# Patient Record
Sex: Male | Born: 1950
Health system: Southern US, Community
[De-identification: ages and names within clinical notes are randomized; demographics above are authoritative.]

## PROBLEM LIST (undated history)

## (undated) DIAGNOSIS — J449 Chronic obstructive pulmonary disease, unspecified: Secondary | ICD-10-CM

## (undated) DIAGNOSIS — E785 Hyperlipidemia, unspecified: Secondary | ICD-10-CM

## (undated) DIAGNOSIS — F32A Depression, unspecified: Secondary | ICD-10-CM

## (undated) DIAGNOSIS — D689 Coagulation defect, unspecified: Secondary | ICD-10-CM

## (undated) DIAGNOSIS — K219 Gastro-esophageal reflux disease without esophagitis: Secondary | ICD-10-CM

## (undated) DIAGNOSIS — F419 Anxiety disorder, unspecified: Secondary | ICD-10-CM

## (undated) DIAGNOSIS — I10 Essential (primary) hypertension: Secondary | ICD-10-CM

## (undated) DIAGNOSIS — K859 Acute pancreatitis without necrosis or infection, unspecified: Secondary | ICD-10-CM

## (undated) DIAGNOSIS — F329 Major depressive disorder, single episode, unspecified: Secondary | ICD-10-CM

## (undated) DIAGNOSIS — I639 Cerebral infarction, unspecified: Secondary | ICD-10-CM

## (undated) DIAGNOSIS — Z5189 Encounter for other specified aftercare: Secondary | ICD-10-CM

## (undated) HISTORY — DX: Encounter for other specified aftercare: Z51.89

## (undated) HISTORY — PX: OTHER SURGICAL HISTORY: SHX169

## (undated) HISTORY — PX: LAPAROSCOPIC LYSIS OF ADHESIONS: SHX5905

## (undated) HISTORY — DX: Hyperlipidemia, unspecified: E78.5

## (undated) HISTORY — DX: Cerebral infarction, unspecified: I63.9

## (undated) HISTORY — DX: Chronic obstructive pulmonary disease, unspecified: J44.9

## (undated) HISTORY — PX: GASTROJEJUNOSTOMY: SHX1697

## (undated) HISTORY — PX: CHOLECYSTECTOMY: SHX55

## (undated) HISTORY — DX: Major depressive disorder, single episode, unspecified: F32.9

## (undated) HISTORY — PX: GASTRECTOMY: SHX58

## (undated) HISTORY — DX: Acute pancreatitis without necrosis or infection, unspecified: K85.90

## (undated) HISTORY — DX: Depression, unspecified: F32.A

## (undated) HISTORY — DX: Gastro-esophageal reflux disease without esophagitis: K21.9

## (undated) HISTORY — DX: Essential (primary) hypertension: I10

## (undated) HISTORY — DX: Anxiety disorder, unspecified: F41.9

## (undated) HISTORY — PX: ROUX-EN-Y GASTRIC BYPASS: SHX1104

## (undated) HISTORY — DX: Coagulation defect, unspecified: D68.9

## (undated) HISTORY — PX: BILE DUCT EXPLORATION: SHX1225

---

## 2007-05-26 LAB — HM COLONOSCOPY: HM Colonoscopy: NORMAL

## 2009-05-25 DIAGNOSIS — D689 Coagulation defect, unspecified: Secondary | ICD-10-CM

## 2009-05-25 HISTORY — DX: Coagulation defect, unspecified: D68.9

## 2010-01-30 ENCOUNTER — Encounter: Payer: Self-pay | Admitting: Family Medicine

## 2010-02-03 ENCOUNTER — Ambulatory Visit: Payer: Self-pay | Admitting: Family Medicine

## 2010-02-03 DIAGNOSIS — I2699 Other pulmonary embolism without acute cor pulmonale: Secondary | ICD-10-CM | POA: Insufficient documentation

## 2010-02-03 DIAGNOSIS — L98499 Non-pressure chronic ulcer of skin of other sites with unspecified severity: Secondary | ICD-10-CM | POA: Insufficient documentation

## 2010-02-03 DIAGNOSIS — K631 Perforation of intestine (nontraumatic): Secondary | ICD-10-CM | POA: Insufficient documentation

## 2010-02-03 DIAGNOSIS — D509 Iron deficiency anemia, unspecified: Secondary | ICD-10-CM | POA: Insufficient documentation

## 2010-02-03 LAB — CONVERTED CEMR LAB: INR: 2.6

## 2010-02-06 ENCOUNTER — Encounter: Payer: Self-pay | Admitting: Family Medicine

## 2010-02-11 ENCOUNTER — Telehealth: Payer: Self-pay | Admitting: Family Medicine

## 2010-02-11 ENCOUNTER — Encounter: Payer: Self-pay | Admitting: Family Medicine

## 2010-02-11 LAB — CONVERTED CEMR LAB: INR: 2

## 2010-02-12 ENCOUNTER — Telehealth: Payer: Self-pay | Admitting: Family Medicine

## 2010-02-14 ENCOUNTER — Telehealth: Payer: Self-pay | Admitting: Family Medicine

## 2010-02-14 ENCOUNTER — Encounter: Payer: Self-pay | Admitting: Family Medicine

## 2010-02-14 LAB — CONVERTED CEMR LAB: INR: 1.3

## 2010-02-21 ENCOUNTER — Telehealth: Payer: Self-pay | Admitting: Family Medicine

## 2010-02-21 ENCOUNTER — Encounter: Payer: Self-pay | Admitting: Family Medicine

## 2010-02-21 LAB — CONVERTED CEMR LAB: INR: 1.1

## 2010-02-24 ENCOUNTER — Telehealth (INDEPENDENT_AMBULATORY_CARE_PROVIDER_SITE_OTHER): Payer: Self-pay | Admitting: *Deleted

## 2010-03-05 ENCOUNTER — Ambulatory Visit: Payer: Self-pay | Admitting: Family Medicine

## 2010-03-05 ENCOUNTER — Encounter: Payer: Self-pay | Admitting: Family Medicine

## 2010-03-05 LAB — CONVERTED CEMR LAB: Prothrombin Time: 15.2 s (ref 11.6–15.2)

## 2010-03-06 LAB — CONVERTED CEMR LAB
Basophils Relative: 0 % (ref 0–1)
Eosinophils Absolute: 0.1 10*3/uL (ref 0.0–0.7)
HCT: 36.5 % — ABNORMAL LOW (ref 39.0–52.0)
Hemoglobin: 12.5 g/dL — ABNORMAL LOW (ref 13.0–17.0)
Lymphs Abs: 2 10*3/uL (ref 0.7–4.0)
MCHC: 34.2 g/dL (ref 30.0–36.0)
MCV: 91.3 fL (ref 78.0–100.0)
Monocytes Absolute: 0.4 10*3/uL (ref 0.1–1.0)
Monocytes Relative: 9 % (ref 3–12)
RBC: 4 M/uL — ABNORMAL LOW (ref 4.22–5.81)
WBC: 5 10*3/uL (ref 4.0–10.5)

## 2010-03-07 ENCOUNTER — Telehealth: Payer: Self-pay | Admitting: Family Medicine

## 2010-03-07 ENCOUNTER — Encounter: Payer: Self-pay | Admitting: Family Medicine

## 2010-03-07 LAB — CONVERTED CEMR LAB
INR: 1.5
Prothrombin Time: 15.6 s

## 2010-03-10 ENCOUNTER — Telehealth: Payer: Self-pay | Admitting: Family Medicine

## 2010-03-17 ENCOUNTER — Encounter: Payer: Self-pay | Admitting: Family Medicine

## 2010-03-25 ENCOUNTER — Ambulatory Visit: Payer: Self-pay | Admitting: Family Medicine

## 2010-04-01 ENCOUNTER — Ambulatory Visit: Payer: Self-pay | Admitting: Family Medicine

## 2010-04-01 LAB — CONVERTED CEMR LAB: INR: 1.3

## 2010-04-08 ENCOUNTER — Ambulatory Visit: Payer: Self-pay | Admitting: Family Medicine

## 2010-04-08 LAB — CONVERTED CEMR LAB: INR: 1.7

## 2010-04-15 ENCOUNTER — Ambulatory Visit: Payer: Self-pay | Admitting: Family Medicine

## 2010-04-15 LAB — CONVERTED CEMR LAB: INR: 2.1

## 2010-04-21 ENCOUNTER — Telehealth: Payer: Self-pay | Admitting: Family Medicine

## 2010-04-28 ENCOUNTER — Ambulatory Visit: Payer: Self-pay | Admitting: Family Medicine

## 2010-05-05 ENCOUNTER — Telehealth: Payer: Self-pay | Admitting: Family Medicine

## 2010-05-20 ENCOUNTER — Ambulatory Visit: Payer: Self-pay | Admitting: Family Medicine

## 2010-05-20 LAB — CONVERTED CEMR LAB: INR: 1.7

## 2010-05-27 ENCOUNTER — Ambulatory Visit
Admission: RE | Admit: 2010-05-27 | Discharge: 2010-05-27 | Payer: Self-pay | Source: Home / Self Care | Attending: Family Medicine | Admitting: Family Medicine

## 2010-05-27 LAB — CONVERTED CEMR LAB: INR: 1.8

## 2010-06-06 ENCOUNTER — Ambulatory Visit
Admission: RE | Admit: 2010-06-06 | Discharge: 2010-06-06 | Payer: Self-pay | Source: Home / Self Care | Attending: Family Medicine | Admitting: Family Medicine

## 2010-06-11 ENCOUNTER — Encounter: Payer: Self-pay | Admitting: Family Medicine

## 2010-06-12 LAB — CONVERTED CEMR LAB
AST: 22 units/L (ref 0–37)
Albumin: 4.5 g/dL (ref 3.5–5.2)
BUN: 17 mg/dL (ref 6–23)
Calcium: 9.7 mg/dL (ref 8.4–10.5)
Chloride: 103 meq/L (ref 96–112)
Creatinine, Ser: 1.31 mg/dL (ref 0.40–1.50)
Glucose, Bld: 86 mg/dL (ref 70–99)
HDL goal, serum: 40 mg/dL
HDL: 36 mg/dL — ABNORMAL LOW (ref >39)
LDL Goal: 130 mg/dL
Potassium: 5.1 meq/L (ref 3.5–5.3)
Total CHOL/HDL Ratio: 6.1
Triglycerides: 304 mg/dL — ABNORMAL HIGH (ref <150)

## 2010-06-20 ENCOUNTER — Ambulatory Visit
Admission: RE | Admit: 2010-06-20 | Discharge: 2010-06-20 | Payer: Self-pay | Source: Home / Self Care | Attending: Family Medicine | Admitting: Family Medicine

## 2010-06-20 LAB — CONVERTED CEMR LAB: INR: 1.9

## 2010-06-24 NOTE — Assessment & Plan Note (Signed)
Summary: NOV: PE, anemia, perf ulcer   Vital Signs:  Patient profile:   60 year old male Height:      73.5 inches Weight:      172 pounds BMI:     22.47 Pulse rate:   112 / minute BP sitting:   107 / 67  (right arm) Cuff size:   regular  Vitals Entered By: Avon Gully CMA, Duncan Dull) (February 03, 2010 11:26 AM) CC: NP pt--est care and f/u coumadin   CC:  NP pt--est care and f/u coumadin.  History of Present Illness: March 7th wetn to ED for flu like sxs. Has a gastric ulcer that went to the pancreas and into the gut. He was leaking bile into the gut and went into the septic shock. Ws transfered to [facility]. Had surgery adn sent home June 22nd adn the drain came out.  Then had pancreatitis. Went badk to the hospital and was d/c home.For a month had fluid leaking inside.  Then went back end of July and had 3 drains placed. Then had a DVT in the right lung  adn then a PE. Then 2 weeks ago spiked a fever.  They felt the PICC line was infected.  Has a pancreatic and duodenum leak. 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 See Dr. Gorden Harms (hepatobiliary syndrome) in [place].  Goes Wednesday for f/u in [place]. Per daughter his output has increassed in the last couple o days.   Anticoagulation Management History:      The patient is on coumadin and comes in today for a routine follow up visit.  Today's INR is 2.6.    Habits & Providers  Alcohol-Tobacco-Diet     Alcohol drinks/day: 0     Tobacco Status: quit     Year Quit: 9yrs ago  Exercise-Depression-Behavior     Does Patient Exercise: no     Drug Use: no  Current Medications (verified): 1)  Citalopram Hydrobromide 20 Mg Tabs (Citalopram Hydrobromide) .... Take One Tablet By Mouth Once A Day 2)  Ferrous Sulfate 325 (65 Fe) Mg Tabs (Ferrous Sulfate) .... One Tablet By Mouth 3 X A Day 3)  Warfarin Sodium 2 Mg Tabs (Warfarin Sodium) .... Take One Tablet By Mouth Daily 4)  Oxycodone Hcl 5 Mg Tabs (Oxycodone Hcl) .... Take Two Tablets  Every 4 Hours As Needed For Pain 5)  Transderm-Scop 1.5 Mg Pt72 (Scopolamine Base) .... Apply Patch Every 2 Hours 6)  Omeprazole 20 Mg Cpdr (Omeprazole) .... Take One Tablet By Mouth Once A Day 7)  Ondansetron Hcl 4 Mg Tabs (Ondansetron Hcl) .... One Tablet By Mouth Every 4 Hours 8)  Promethazine Hcl 12.5 Mg Tabs (Promethazine Hcl) .... One Tablet By Mouth Every 6 Hours 9)  Multivitamins  Caps (Multiple Vitamin) .... One Tablet By Mouth Once A Day  Allergies (verified): No Known Drug Allergies  Comments:  Nurse/Medical Assistant: The patient's medications and allergies were reviewed with the patient and were updated in the Medication and Allergy Lists. Avon Gully CMA, Duncan Dull) (February 03, 2010 11:33 AM)  Past History:  Past Medical History: Hx of GI ulcers sine a teenager.   Past Surgical History: Cholecystectomy and 3 reconsgtruction surgeries. 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    Family History: Father wtih Prosta and lip cancer, MI Brother with lung Ca Sister with colon Ca Mother wtih HTN  Social History: Divorced. Former Smoker Alcohol use-no Drug use-no Regular exercise-no Smoking Status:  quit Does Patient Exercise:  no Drug Use:  no  Review  of Systems       No fever/sweats/weakness, unexplained weight loss/gain.  No vison changes.  No difficulty hearing/ringing in ears, hay fever/allergies.  No chest pain/discomfort, palpitations.  No Br lump/nipple discharge.  No cough/wheeze.  No blood in BM, nausea/vomiting/diarrhea.  No nighttime urination, leaking urine, unusual vaginal bleeding, discharge (penis or vagina).  No muscle/joint pain. No rash, change in mole.  No HA, memory loss.  No anxiety, sleep d/o, depression.  No easy bruising/bleeding, unexplained lump   Physical Exam  General:  Well-developed,well-nourished,in no acute distress; alert,appropriate and cooperative throughout examination Head:  Normocephalic and atraumatic without obvious abnormalities. No  apparent alopecia or balding. Neck:  No deformities, masses, or tenderness noted. Lungs:  Normal respiratory effort, chest expands symmetrically. Lungs are clear to auscultation, no crackles or wheezes. Heart:  Normal rate and regular rhythm. S1 and S2 normal without gallop, murmur, click, rub or other extra sounds. Abdomen:  incision well healed. 2 drain in place a a bag in place over second wound.     Impression & Recommendations:  Problem # 1:  PE (ICD-415.19)  Offered flu shot but says he was told not to get it yet until his health is better.   Adjusted his coumadin today.  His updated medication list for this problem includes:    Warfarin Sodium 2 Mg Tabs (Warfarin sodium) .Marland Kitchen... Take one tablet by mouth daily  Orders: Fingerstick (16109) Protime INR (60454)  Problem # 2:  ANEMIA, IRON DEFICIENCY (ICD-280.9) Will check his levels to see if can decreaes his iron dose form three times a day which is very constipating.  His updated medication list for this problem includes:    Ferrous Sulfate 325 (65 Fe) Mg Tabs (Ferrous sulfate) ..... One tablet by mouth 3 x a day  Orders: T-CBC w/Diff [phone]) T-Iron [phone]) T-Iron Binding Capacity (TIBC) ([address]) T-Vitamin B12 [phone])  Problem # 3:  PERFORATION OF INTESTINE (NUU-725.36) Keep appt with surgeon on Wednesday. If output increases dramtically or fever in the next 48 hours then call their office sooner.    Complete Medication List: 1)  Citalopram Hydrobromide 20 Mg Tabs (Citalopram hydrobromide) .... Take one tablet by mouth once a day 2)  Ferrous Sulfate 325 (65 Fe) Mg Tabs (Ferrous sulfate) .... One tablet by mouth 3 x a day 3)  Warfarin Sodium 2 Mg Tabs (Warfarin sodium) .... Take one tablet by mouth daily 4)  Oxycodone Hcl 5 Mg Tabs (Oxycodone hcl) .... Take two tablets every 4 hours as needed for pain 5)  Omeprazole 20 Mg Cpdr (Omeprazole) .... Take one tablet by mouth once a day 6)  Ondansetron Hcl 4  Mg Tabs (Ondansetron hcl) .... One tablet by mouth every 4 hours 7)  Promethazine Hcl 12.5 Mg Tabs (Promethazine hcl) .... One tablet by mouth every 6 hours 8)  Multivitamins Caps (Multiple vitamin) .... One tablet by mouth once a day 9)  Fentanyl 25 Mcg/hr Pt72 (Fentanyl) .... Apply to skin eveyr 3 days the replace.  Anticoagulation Management Assessment/Plan:      He is to have a PT/INR in 1 week.  Anticoagulation instructions were given to patient.         Current Anticoagulation Instructions: Warfarin sodium 2 mg tabs: take one tablet by mouth daily.  The patient is to continue with the same dose of coumadin.  This dosage includes: Repeat PT/INR in 1 week.    Patient Instructions: 1)  Please schedule a follow-up appointment in 1 year for office visit.  Prescriptions: FENTANYL 25 MCG/HR PT72 (FENTANYL) apply to skin eveyr 3 days The replace.  #10 day x 0   Entered and Authorized by:   Nani Gasser MD   Signed by:   Nani Gasser MD on 02/03/2010   Method used:   Print then Give to Patient   RxID:   [id number]    Laboratory Results   Blood Tests   Date/Time Received: 02/03/10 Date/Time Reported: 02/03/10   INR: 2.6   (Normal Range: 0.88-1.12   Therap INR: 2.0-3.5)      Flex Sig Next Due:  Not Indicated Colonoscopy Result Date:  05/26/2007 Colonoscopy Result:  normal Hemoccult Next Due:  Not Indicated

## 2010-06-24 NOTE — Miscellaneous (Signed)
Summary: Coumadin Order/[facility]  Coumadin Order/[facility]   Imported By: Lanelle Bal 02/19/2010 13:58:27  _____________________________________________________________________  External Attachment:    Type:   Image     Comment:   External Document

## 2010-06-24 NOTE — Assessment & Plan Note (Signed)
Summary: INR  Nurse Visit   Allergies: No Known Drug Allergies Laboratory Results   Blood Tests   Date/Time Received: 04/08/10 Date/Time Reported: 04/08/10   INR: 1.7   (Normal Range: 0.88-1.12   Therap INR: 2.0-3.5)    Orders Added: 1)  Fingerstick [36416] 2)  Protime INR [85610] Prescriptions: TRANSDERM-SCOP 1.5 MG PT72 (SCOPOLAMINE BASE) Apply x 1 and remove after 3 days.  #2 x 0   Entered and Authorized by:   Nani Gasser MD   Signed by:   Nani Gasser MD on 04/08/2010   Method used:   [facility] [phone][facility] (retail)       [address]       [place], [place]  [address]       Ph: [phone] or [phone]       Fax: [phone]   RxID:   [id number]    Anticoagulation Management History:      The patient is on coumadin and comes in today for a routine follow up visit.  Coumadin therapy is being given due to the first episode of deep venous thrombosis and/or pulmonary embolism.  His last INR was 1.3 and today's INR is 1.7.    Anticoagulation Management Assessment/Plan:      The target INR is 2.0-3.0.  He is to have a PT/INR in 1 week.  Anticoagulation instructions were given to patient.         Current Anticoagulation Instructions: The patient's dosage of coumadin will be increased.  The new dosage includes: Coumadin 4 mg tabs and Coumadin 1 mg tabs:  Sunday - 5 mg, Monday - 5 mg, Tuesday - 6 mg, Wednesday - 5 mg, Thursday - 6 mg, Friday - 5 mg, Saturday - 5 mg.  Repeat PT/INR in 1 week.

## 2010-06-24 NOTE — Progress Notes (Signed)
Summary: INR results  Phone Note From Other Clinic   Caller: Cynthia/ [facility] Call For: [facility] Summary of Call: Nurse calls with PT/INR results. PT=12.7  INR=1.3.  Call pt with any changes Initial call taken by: Kathlene November,  February 14, 2010 3:07 PM  Follow-up for Phone Call        Pt notified of new instructions. kJ LPN Follow-up by: Kathlene November,  February 14, 2010 3:32 PM    New/Updated Medications: COUMADIN 3 MG TABS (WARFARIN SODIUM) Sunday - 3 mg, Monday - 2 mg, Tuesday - 3 mg, Wednesday - 2 mg, Thursday - 2 mg, Friday - 3 mg, Saturday - 3 mg COUMADIN 2 MG TABS (WARFARIN SODIUM) Sunday - 3 mg, Monday - 2 mg, Tuesday - 3 mg, Wednesday - 2 mg, Thursday - 2 mg, Friday - 3 mg, Saturday - 3 mg   Anticoagulation Management History:      His last INR was 2.0 and today's INR is 1.3.    Anticoagulation Management Assessment/Plan:      He is to have a PT/INR in 1 week.         Current Anticoagulation Instructions: The patient's dosage of coumadin will be increased.  The new dosage includes: Coumadin 3 mg tabs and Coumadin 2 mg tabs:  Sunday - 3 mg, Monday - 2 mg, Tuesday - 3 mg, Wednesday - 2 mg, Thursday - 2 mg, Friday - 3 mg, Saturday - 3 mg.  Repeat PT/INR in 1 week.

## 2010-06-24 NOTE — Assessment & Plan Note (Signed)
Summary: Coumadin check  Nurse Visit   Allergies: No Known Drug Allergies Laboratory Results   Blood Tests   Date/Time Received: 04/01/10 Date/Time Reported: 04/01/10   INR: 1.3   (Normal Range: 0.88-1.12   Therap INR: 2.0-3.5)    Immunizations Administered:  Pneumonia Vaccine:    Vaccine Type: Pneumovax    Site: left deltoid    Mfr: Merck    Dose: 0.5 ml    Route: IM    Given by: Sue Lush McCrimmon CMA, (AAMA)    Exp. Date: 08/11/2011    Lot #: [id number]    VIS given: 04/29/09 version given April 01, 2010.  Orders Added: 1)  Fingerstick [36416] 2)  Protime INR [85610] 3)  Pneumococcal Vaccine [90732] 4)  Admin 1st Vaccine [52841] Prescriptions: FENTANYL 25 MCG/HR PT72 (FENTANYL) apply to skin eveyr 3 days The replace.  #10 ptaches x 0   Entered by:   Avon Gully CMA, (AAMA)   Authorized by:   Nani Gasser MD   Signed by:   Avon Gully CMA, (AAMA) on 04/01/2010   Method used:   Printed then faxed to ...       [facility]  [address] [phone]* (retail)       [address] [place], [place]  [address]       Ph: [phone] or [phone]       Fax: [phone]   RxID:   [id number]    Anticoagulation Management History:      Coumadin therapy is being given due to the first episode of deep venous thrombosis and/or pulmonary embolism.  His last INR was 1.4 and today's INR is 1.3.    Anticoagulation Management Assessment/Plan:      The target INR is 2.0-3.0.  He is to have a PT/INR in 1 week.  Anticoagulation instructions were given to patient.         Current Anticoagulation Instructions: The patient's dosage of coumadin will be increased.  The new dosage includes: Coumadin 4 mg tabs and Coumadin 1 mg tabs:  Sunday - 5 mg, Monday - 5 mg, Tuesday - 5 mg, Wednesday - 5 mg, Thursday - 5 mg, Friday - 5 mg, Saturday - 5 mg.  Repeat PT/INR in 1 week.

## 2010-06-24 NOTE — Progress Notes (Signed)
Summary: PT/INR  Phone Note From Other Clinic Call back at [phone]   Initial call taken by: Avon Gully CMA, Duncan Dull),  March 07, 2010 3:13 PM Caller:  Deatra Ina from [place] Call For: Dr.[name] Summary of Call:  INR 1.5   PT15.6 kathey from [place] called with these results and wants to know if we need to adjust meds Initial call taken by: Avon Gully CMA, Duncan Dull),  March 07, 2010 3:14 PM    New/Updated Medications: COUMADIN 4 MG TABS (WARFARIN SODIUM) Sunday - 4 mg, Monday - 4 mg, Tuesday - 4 mg, Wednesday - 4 mg, Thursday - 4 mg, Friday - 4 mg, Saturday - 4 mg   Anticoagulation Management History:      His anticoagulation is being managed by telephone today.  He is being anticoagulated because of the first episode of deep venous thrombosis and/or pulmonary embolism.  His last INR was 1.21 and today's INR is 1.5.    Anticoagulation Management Assessment/Plan:      The target INR is 2.0-3.0.  He is to have a PT/INR in 4 weeks.  Anticoagulation instructions were given to home health nurse.         Current Anticoagulation Instructions: The patient's dosage of coumadin will be increased.  The new dosage includes:   Coumadin 4 mg tabs:  Sunday - 4 mg, Monday - 4 mg, Tuesday - 4 mg, Wednesday - 4 mg, Thursday - 4 mg, Friday - 4 mg, Saturday - 4 mg.    Repeat PT/INR in 4 weeks.    Appended Document: PT/INR 03/07/10 called and notified pt of coumadin instructions. 3:50 acm

## 2010-06-24 NOTE — Miscellaneous (Signed)
Summary: Orders/[facility]  Orders/[facility]   Imported By: Sherian Rein 03/25/2010 14:09:58  _____________________________________________________________________  External Attachment:    Type:   Image     Comment:   External Document

## 2010-06-24 NOTE — Progress Notes (Signed)
Summary: PT/INR  Phone Note From Other Clinic   Caller: Rodney Booze w/ [facility] Summary of Call: PT= 11.2  INR- 1.1   Initial call taken by: Kathlene November LPN,  February 21, 2010 12:40 PM    New/Updated Medications: COUMADIN 3 MG TABS (WARFARIN SODIUM) Sunday - 3 mg, Monday - 3 mg, Tuesday - 3 mg, Wednesday - 4 mg, Thursday - 3 mg, Friday - 3 mg, Saturday - 3 mg COUMADIN 1 MG TABS (WARFARIN SODIUM) Sunday - 3 mg, Monday - 3 mg, Tuesday - 3 mg, Wednesday - 4 mg, Thursday - 3 mg, Friday - 3 mg, Saturday - 3 mg   Anticoagulation Management History:      His anticoagulation is being managed by telephone today.  He is being anticoagulated because of the first episode of deep venous thrombosis and/or pulmonary embolism.  His last INR was 1.3 and today's INR is 1.1.    Anticoagulation Management Assessment/Plan:      The target INR is 2.0-3.0.  He is to have a PT/INR in 2 weeks.  Anticoagulation instructions were given to patient.         Current Anticoagulation Instructions: The patient's dosage of coumadin will be increased.  The new dosage includes:  Coumadin 3 mg tabs and Coumadin 1 mg tabs:  Sunday - 3 mg, Monday - 3 mg, Tuesday - 3 mg, Wednesday - 4 mg, Thursday - 3 mg, Friday - 3 mg, Saturday - 3 mg.    Repeat PT/INR in 2 weeks.    Appended Document: PT/INR 02/21/2010 @ 1:04pm- Pt  notified of results and new coumadin instructions. kJ LPN

## 2010-06-24 NOTE — Progress Notes (Signed)
Summary: Check PT/INR  Phone Note Call from Patient   Caller: Daughter Call For: Nani Gasser MD Summary of Call: daughter had called and said seen Dr. in [place] his surgeon and surgeon recommend she call and see if his [place] could be checked this again due to his malnutrition Initial call taken by: Kathlene November,  February 12, 2010 2:23 PM  Follow-up for Phone Call        Call French Ana with home health and ask her to recheck it on Thurs or Friday.  Follow-up by: Nani Gasser MD,  February 12, 2010 3:31 PM  Additional Follow-up for Phone Call Additional follow up Details #1::        Called French Ana and LM on VM to recheck today or tomorrow Additional Follow-up by: Kathlene November,  February 13, 2010 8:07 AM

## 2010-06-24 NOTE — Assessment & Plan Note (Signed)
Summary: INR   Anticoagulation Management History:      The patient is on coumadin and comes in today for a routine follow up visit.  Coumadin therapy is being given due to the first episode of deep venous thrombosis and/or pulmonary embolism.  His last INR was 1.7 and today's INR is 2.1.    Allergies: No Known Drug Allergies   Complete Medication List: 1)  Citalopram Hydrobromide 20 Mg Tabs (Citalopram hydrobromide) .... Take one tablet by mouth once a day 2)  Ferrous Sulfate 325 (65 Fe) Mg Tabs (Ferrous sulfate) .... One tablet by mouth 3 x a day 3)  Oxycodone Hcl 5 Mg Tabs (Oxycodone hcl) .... Take two tablets every 4 hours as needed for pain 4)  Omeprazole 20 Mg Cpdr (Omeprazole) .... Take one tablet by mouth once a day 5)  Ondansetron Hcl 4 Mg Tabs (Ondansetron hcl) .... One tablet by mouth every 4 hours 6)  Promethazine Hcl 12.5 Mg Tabs (Promethazine hcl) .... One tablet by mouth every 6 hours 7)  Multivitamins Caps (Multiple vitamin) .... One tablet by mouth once a day 8)  Fentanyl 25 Mcg/hr Pt72 (Fentanyl) .... Apply to skin eveyr 3 days the replace. 9)  Coumadin 4 Mg Tabs (Warfarin sodium) .... Sunday - 5 mg, monday - 5 mg, tuesday - 6 mg, wednesday - 5 mg, thursday - 6 mg, friday - 5 mg, saturday - 5 mg 10)  Coumadin 1 Mg Tabs (Warfarin sodium) .... Sunday - 5 mg, monday - 5 mg, tuesday - 6 mg, wednesday - 5 mg, thursday - 6 mg, friday - 5 mg, saturday - 5 mg 11)  Transderm-scop 1.5 Mg Pt72 (Scopolamine base) .... Apply x 1 and remove after 3 days.  Other Orders: Protime INR (28413)  Anticoagulation Management Assessment/Plan:      The target INR is 2.0-3.0.  He is to have a PT/INR in 2 weeks.         Current Anticoagulation Instructions: The patient is to continue with the same dose of coumadin.  This dosage includes: Coumadin 4 mg tabs and Coumadin 1 mg tabs:  Sunday - 5 mg, Monday - 5 mg, Tuesday - 6 mg, Wednesday - 5 mg, Thursday - 6 mg, Friday - 5 mg, Saturday - 5 mg.   Repeat PT/INR in 2 weeks.     Orders Added: 1)  Protime INR [85610]    Laboratory Results   Blood Tests   Date/Time Received: 04/15/10 Date/Time Reported: 04/15/10   INR: 2.1   (Normal Range: 0.88-1.12   Therap INR: 2.0-3.5)

## 2010-06-24 NOTE — Progress Notes (Signed)
Summary: refills  Phone Note Call from Patient Call back at Home Phone [phone]   Caller: Patient Call For: Nani Gasser MD Summary of Call: Pt daughter calls and needs to get refills on the transderm scope patch and the celexa for her dad- [facility] Initial call taken by: Kathlene November LPN,  April 21, 2010 8:34 AM    Prescriptions: TRANSDERM-SCOP 1.5 MG PT72 (SCOPOLAMINE BASE) Apply x 1 and remove after 3 days.  #2 x 0   Entered and Authorized by:   Nani Gasser MD   Signed by:   Nani Gasser MD on 04/21/2010   Method used:   [facility] [phone]* (retail)       [address]       [place], [place]  [address]       Ph: [phone] or [phone]       Fax: [phone]   RxID:   [id number] CITALOPRAM HYDROBROMIDE 20 MG TABS (CITALOPRAM HYDROBROMIDE) take one tablet by mouth once a day  #90 x 0   Entered and Authorized by:   Nani Gasser MD   Signed by:   Nani Gasser MD on 04/21/2010   Method used:   [facility] [phone]* (retail)       [address]       [place], [place]  [address]       Ph: [phone] or [phone]       Fax: [phone]   RxID:   [id number]

## 2010-06-24 NOTE — Progress Notes (Signed)
Summary: PT INR from [facility]  Phone Note From Other Clinic   Caller: French Ana w/ [facility] [phone] Summary of Call: French Ana calls today stating Pt's INR was 2.0 and PT was 19.8.  Pt is taking 4 mg of coumadin daily and is also taking 80mg  of lovenox two times a day.  Initial call taken by: Payton Spark CMA,  February 11, 2010 11:37 AM  Follow-up for Phone Call        Can stop the lovenox and adjust dose as below. Recheck in one week.  Follow-up by: Nani Gasser MD,  February 11, 2010 11:45 AM  Additional Follow-up for Phone Call Additional follow up Details #1::        Spoke with French Ana and given instructions above. Spoke with pt as well and given instructions. Additional Follow-up by: Kathlene November,  February 11, 2010 1:44 PM    New/Updated Medications: COUMADIN 2 MG TABS (WARFARIN SODIUM) Sunday - 2 mg, Monday - 2 mg, Tuesday - 3 mg, Wednesday - 2 mg, Thursday - 2 mg, Friday - 3 mg, Saturday - 2 mg   Anticoagulation Management History:      The patient is on coumadin and comes in today for a routine follow up visit.  His last INR was 2.6 and today's INR is 2.0.    Anticoagulation Management Assessment/Plan:      He is to have a PT/INR in 1 week.  Anticoagulation instructions were given to home health nurse.         Current Anticoagulation Instructions: The patient's dosage of coumadin will be increased.  The new dosage includes: Coumadin 2 mg tabs:  Sunday - 2 mg, Monday - 2 mg, Tuesday - 3 mg, Wednesday - 2 mg, Thursday - 2 mg, Friday - 3 mg, Saturday - 2 mg.  Repeat PT/INR in 1 week.

## 2010-06-24 NOTE — Assessment & Plan Note (Signed)
Summary: INR  Nurse Visit   Allergies: No Known Drug Allergies Laboratory Results   Blood Tests   Date/Time Received: 04/28/10 Date/Time Reported: 04/28/10   INR: 2.0   (Normal Range: 0.88-1.12   Therap INR: 2.0-3.5)    Orders Added: 1)  Fingerstick [36416] 2)  Protime INR [85610]   Anticoagulation Management History:      The patient is on coumadin and comes in today for a routine follow up visit.  Coumadin therapy is being given due to the first episode of deep venous thrombosis and/or pulmonary embolism.  His last INR was 2.1 and today's INR is 2.0.    Anticoagulation Management Assessment/Plan:      The target INR is 2.0-3.0.  He is to have a PT/INR in 3 weeks.         Current Anticoagulation Instructions: The patient is to continue with the same dose of coumadin.  This dosage includes: Coumadin 4 mg tabs and Coumadin 1 mg tabs:  Sunday - 5 mg, Monday - 5 mg, Tuesday - 6 mg, Wednesday - 5 mg, Thursday - 6 mg, Friday - 5 mg, Saturday - 5 mg.  Repeat PT/INR in 3 weeks.

## 2010-06-24 NOTE — Assessment & Plan Note (Signed)
Summary: 1 mo f/u.  Coumadin, anemia, flu vaccine   Vital Signs:  Patient profile:   60 year old male Height:      73.5 inches Weight:      184 pounds BMI:     24.03 Pulse rate:   68 / minute BP sitting:   93 / 62  (right arm) Cuff size:   regular  Vitals Entered By: Avon Gully CMA, Duncan Dull) (March 05, 2010 9:40 AM) CC: f/u coumadin    CC:  f/u coumadin .  History of Present Illness: He is overall doing better. He is finally gaining some weight and feels a little more mobile. He is here to check his coumadin level.  No easy brusing or bleeding. No bleeding of gums. Does have a little "blood blister" from trauma but feels it is healing well.   Current Medications (verified): 1)  Citalopram Hydrobromide 20 Mg Tabs (Citalopram Hydrobromide) .... Take One Tablet By Mouth Once A Day 2)  Ferrous Sulfate 325 (65 Fe) Mg Tabs (Ferrous Sulfate) .... One Tablet By Mouth 3 X A Day 3)  Oxycodone Hcl 5 Mg Tabs (Oxycodone Hcl) .... Take Two Tablets Every 4 Hours As Needed For Pain 4)  Omeprazole 20 Mg Cpdr (Omeprazole) .... Take One Tablet By Mouth Once A Day 5)  Ondansetron Hcl 4 Mg Tabs (Ondansetron Hcl) .... One Tablet By Mouth Every 4 Hours 6)  Promethazine Hcl 12.5 Mg Tabs (Promethazine Hcl) .... One Tablet By Mouth Every 6 Hours 7)  Multivitamins  Caps (Multiple Vitamin) .... One Tablet By Mouth Once A Day 8)  Fentanyl 25 Mcg/hr Pt72 (Fentanyl) .... Apply To Skin Eveyr 3 Days The Replace. 9)  Coumadin 3 Mg Tabs (Warfarin Sodium) .... Sunday - 3 Mg, Monday - 3 Mg, Tuesday - 3 Mg, Wednesday - 4 Mg, Thursday - 3 Mg, Friday - 3 Mg, Saturday - 3 Mg 10)  Coumadin 1 Mg Tabs (Warfarin Sodium) .... Sunday - 3 Mg, Monday - 3 Mg, Tuesday - 3 Mg, Wednesday - 4 Mg, Thursday - 3 Mg, Friday - 3 Mg, Saturday - 3 Mg  Allergies (verified): No Known Drug Allergies  Comments:  Nurse/Medical Assistant: The patient's medications and allergies were reviewed with the patient and were updated in the  Medication and Allergy Lists. Avon Gully CMA, Duncan Dull) (March 05, 2010 9:42 AM)  Physical Exam  Skin:  Small 0.5 cm hemtoma on the right forearm.    Impression & Recommendations:  Problem # 1:  PE (ICD-415.19) Will check coumadin and ajust dose per note.  He is not symptomatic for supratherapuetic INR.  Given flu vaccine today.  His updated medication list for this problem includes:    Coumadin 3 Mg Tabs (Warfarin sodium) ..... Sunday - 4 mg, monday - 4 mg, tuesday - 3 mg, wednesday - 4 mg, thursday - 3 mg, friday - 4 mg, saturday - 3 mg    Coumadin 1 Mg Tabs (Warfarin sodium) ..... Sunday - 4 mg, monday - 4 mg, tuesday - 3 mg, wednesday - 4 mg, thursday - 3 mg, friday - 4 mg, saturday - 3 mg  Orders: T- * Misc. Laboratory test [id number])  Problem # 2:  ANEMIA, IRON DEFICIENCY (ICD-280.9) He never had his labs drawn so gave those to him to do today.  His updated medication list for this problem includes:    Ferrous Sulfate 325 (65 Fe) Mg Tabs (Ferrous sulfate) ..... One tablet by mouth 3 x a day  Complete Medication  List: 1)  Citalopram Hydrobromide 20 Mg Tabs (Citalopram hydrobromide) .... Take one tablet by mouth once a day 2)  Ferrous Sulfate 325 (65 Fe) Mg Tabs (Ferrous sulfate) .... One tablet by mouth 3 x a day 3)  Oxycodone Hcl 5 Mg Tabs (Oxycodone hcl) .... Take two tablets every 4 hours as needed for pain 4)  Omeprazole 20 Mg Cpdr (Omeprazole) .... Take one tablet by mouth once a day 5)  Ondansetron Hcl 4 Mg Tabs (Ondansetron hcl) .... One tablet by mouth every 4 hours 6)  Promethazine Hcl 12.5 Mg Tabs (Promethazine hcl) .... One tablet by mouth every 6 hours 7)  Multivitamins Caps (Multiple vitamin) .... One tablet by mouth once a day 8)  Fentanyl 25 Mcg/hr Pt72 (Fentanyl) .... Apply to skin eveyr 3 days the replace. 9)  Coumadin 3 Mg Tabs (Warfarin sodium) .... Sunday - 4 mg, monday - 4 mg, tuesday - 3 mg, wednesday - 4 mg, thursday - 3 mg, friday - 4 mg, saturday - 3  mg 10)  Coumadin 1 Mg Tabs (Warfarin sodium) .... Sunday - 4 mg, monday - 4 mg, tuesday - 3 mg, wednesday - 4 mg, thursday - 3 mg, friday - 4 mg, saturday - 3 mg  Other Orders: Flu Vaccine 57yrs + ([address]) Admin 1st Vaccine (60454)  Patient Instructions: 1)  We will call with the coumadin level.    Immunizations Administered:  Influenza Vaccine # 1:    Vaccine Type: Fluvax 3+    Site: left deltoid    Mfr: GlaxoSmithKline    Dose: 0.5 ml    Route: IM    Given by: Sue Lush McCrimmon CMA, (AAMA)    Exp. Date: 11/22/2010    Lot #: [id number]    VIS given: 12/17/09 version given March 05, 2010.  Flu Vaccine Consent Questions:    Do you have a history of severe allergic reactions to this vaccine? no    Any prior history of allergic reactions to egg and/or gelatin? no    Do you have a sensitivity to the preservative Thimersol? no    Do you have a past history of Guillan-Barre Syndrome? no    Do you currently have an acute febrile illness? no    Have you ever had a severe reaction to latex? no    Vaccine information given and explained to patient? no

## 2010-06-24 NOTE — Progress Notes (Signed)
  Phone Note Refill Request Message from:  Patient on February 24, 2010 9:14 AM  Refills Requested: Medication #1:  FENTANYL 25 MCG/HR PT72 apply to skin eveyr 3 days The replace.   Supply Requested: 1 month  Method Requested: Fax to Local Pharmacy Initial call taken by: Kathlene November LPN,  February 24, 2010 9:14 AM  Follow-up for Phone Call        Sue Lush can you fax this.  Thanks Kim Follow-up by: Kathlene November LPN,  February 24, 2010 12:00 PM  Additional Follow-up for Phone Call Additional follow up Details #1::        rx faxed Additional Follow-up by: Avon Gully CMA, Duncan Dull),  February 24, 2010 12:59 PM    Prescriptions: FENTANYL 25 MCG/HR PT72 (FENTANYL) apply to skin eveyr 3 days The replace.  #10 ptaches x 0   Entered and Authorized by:   Nani Gasser MD   Signed by:   Nani Gasser MD on 02/24/2010   Method used:   Printed then faxed to ...       [facility]  [address] [phone][facility] (retail)       [address] [place], [place]  [address]       Ph: [phone] or [phone]       Fax: [phone]   RxID:   [id number]

## 2010-06-24 NOTE — Progress Notes (Signed)
Summary: Needs Coumadin tabs  Phone Note Call from Patient Call back at Home Phone [phone]   Caller: Patient Call For: Christopher Gasser MD Summary of Call: Pt is on Coumadin 4mg . Only has 3mg  tabs and very hard to cut them to make the extra 1 mg he needs. Would like to know if you could send over the $mg tabs to pharmacy or 1mg  tabs either one. [facility] [address] Initial call taken by: Kathlene November LPN,  March 10, 2010 9:39 AM  Follow-up for Phone Call        Rx Called In Follow-up by: Christopher Gasser MD,  March 10, 2010 9:53 AM  Additional Follow-up for Phone Call Additional follow up Details #1::        Pt notified med sent to pharmacy Additional Follow-up by: Kathlene November LPN,  March 10, 2010 10:25 AM    New/Updated Medications: COUMADIN 1 MG TABS (WARFARIN SODIUM) Take 1 tablet by mouth once a day as needed Prescriptions: COUMADIN 1 MG TABS (WARFARIN SODIUM) Take 1 tablet by mouth once a day as needed  #30 x 2   Entered and Authorized by:   Christopher Gasser MD   Signed by:   Christopher Gasser MD on 03/10/2010   Method used:   [facility]        [facility] [phone][facility] (retail)       [address]       [place], [place]  [address]       Ph: [phone] or [phone]       Fax: [phone]   RxID:   [id number]

## 2010-06-24 NOTE — Assessment & Plan Note (Signed)
Summary: COUMADIN  Nurse Visit   Vital Signs:  Patient profile:   60 year old male Weight:      194 pounds  Allergies: No Known Drug Allergies Laboratory Results   Blood Tests   Date/Time Received: 03/25/10 Date/Time Reported: 03/25/10   INR: 1.4   (Normal Range: 0.88-1.12   Therap INR: 2.0-3.5)    Orders Added: 1)  Fingerstick [36416] 2)  Protime INR [85610]   Anticoagulation Management History:      The patient is on coumadin and comes in today for a routine follow up visit.  He is being anticoagulated because of the first episode of deep venous thrombosis and/or pulmonary embolism.  His last INR was 1.5 and today's INR is 1.4.    Anticoagulation Management Assessment/Plan:      The target INR is 2.0-3.0.  He is to have a PT/INR in 1 week.  Anticoagulation instructions were given to patient.         Current Anticoagulation Instructions: The patient's dosage of coumadin will be increased.  The new dosage includes: Coumadin 4 mg tabs and Coumadin 1 mg tabs:  Sunday - 4 mg, Monday - 4 mg, Tuesday - 5 mg, Wednesday - 4 mg, Thursday - 5 mg, Friday - 4 mg, Saturday - 5 mg.  Repeat PT/INR in 1 week.

## 2010-06-26 NOTE — Progress Notes (Signed)
Summary: refill  Phone Note Refill Request Message from:  Patient on May 05, 2010 8:40 AM  Refills Requested: Medication #1:  FENTANYL 25 MCG/HR PT72 apply to skin eveyr 3 days The replace.   Supply Requested: 1 month Will pick up after lunch   Method Requested: Pick up at Office Initial call taken by: Kathlene November LPN,  May 05, 2010 8:41 AM    Prescriptions: FENTANYL 25 MCG/HR PT72 (FENTANYL) apply to skin eveyr 3 days The replace.  #10 ptaches x 0   Entered and Authorized by:   Nani Gasser MD   Signed by:   Nani Gasser MD on 05/05/2010   Method used:   Print then Give to Patient   RxID:   [id number]

## 2010-06-26 NOTE — Assessment & Plan Note (Signed)
Summary: 3 mo f/u   Vital Signs:  Patient profile:   60 year old male Height:      73.5 inches Weight:      218 pounds BMI:     28.47 Pulse rate:   56 / minute BP sitting:   126 / 77  (right arm) Cuff size:   regular  Vitals Entered By: Avon Gully CMA, Duncan Dull) (June 06, 2010 9:18 AM) CC: f/u coumadin   CC:  f/u coumadin.  History of Present Illness: Says Catheter has not been filling up for the last 2 weeks adn he has felt well. F/u with the surgeon on Monday. He is hopeful they will be able to reverse it soon. He knows he needs to start weaning his fentanyl patch soon but wants to wait and see what the surgeon says.  He does need a refill on the patch.   Anticoagulation Management History:      The patient is on coumadin and comes in today for a routine follow up visit.  Anticoagulation is being administered due to the first episode of deep venous thrombosis and/or pulmonary embolism.  Anticipated length of treatment is 6 months.  His last INR was 1.8 and today's INR is 1.8.    Current Medications (verified): 1)  Citalopram Hydrobromide 20 Mg Tabs (Citalopram Hydrobromide) .... Take One Tablet By Mouth Once A Day 2)  Omeprazole 20 Mg Cpdr (Omeprazole) .... Take One Tablet By Mouth Once A Day 3)  Ondansetron Hcl 4 Mg Tabs (Ondansetron Hcl) .... One Tablet By Mouth Every 4 Hours 4)  Promethazine Hcl 12.5 Mg Tabs (Promethazine Hcl) .... One Tablet By Mouth Every 6 Hours 5)  Multivitamins  Caps (Multiple Vitamin) .... One Tablet By Mouth Once A Day 6)  Fentanyl 25 Mcg/hr Pt72 (Fentanyl) .... Apply To Skin Eveyr 3 Days The Replace. 7)  Coumadin 4 Mg Tabs (Warfarin Sodium) .... Sunday - 5 Mg, Monday - 5 Mg, Tuesday - 6 Mg, Wednesday - 6 Mg, Thursday - 6 Mg, Friday - 5 Mg, Saturday - 6 Mg 8)  Coumadin 1 Mg Tabs (Warfarin Sodium) .... Sunday - 5 Mg, Monday - 5 Mg, Tuesday - 6 Mg, Wednesday - 6 Mg, Thursday - 6 Mg, Friday - 5 Mg, Saturday - 6 Mg  Allergies (verified): No Known  Drug Allergies  Comments:  Nurse/Medical Assistant: The patient's medications and allergies were reviewed with the patient and were updated in the Medication and Allergy Lists. Avon Gully CMA, Duncan Dull) (June 06, 2010 9:19 AM)  Physical Exam  General:  Well-developed,well-nourished,in no acute distress; alert,appropriate and cooperative throughout examination Lungs:  Normal respiratory effort, chest expands symmetrically. Lungs are clear to auscultation, no crackles or wheezes. Heart:  Normal rate and regular rhythm. S1 and S2 normal without gallop, murmur, click, rub or other extra sounds. Skin:  no rashes.   Psych:  Cognition and judgment appear intact. Alert and cooperative with normal attention span and concentration. No apparent delusions, illusions, hallucinations   Impression & Recommendations:  Problem # 1:  SCREENING FOR LIPOID DISORDERS (ICD-V77.91) Due for lipids. Hasn't been checked in over a year.  Orders: T-Comprehensive Metabolic Panel [phone]) T-Lipid Profile [phone])  Problem # 2:  PERFORATION OF INTESTINE (GNF-621.30) Will check his amylase and lipase.  Orders: T-Amylase ([id number]) T-Lipase ([id number])  Problem # 3:  PE (ICD-415.19) It has been almost 6 months, thus he realy is a candidate the stop his coumadin. I would like him to also ask he surgeon what  he thinks about this before I discontinue his coumadin.  Assked him to call me after his appt next week.  The following medications were removed from the medication list:    Coumadin 4 Mg Tabs (Warfarin sodium) ..... Sunday - 5 mg, monday - 5 mg, tuesday - 6 mg, wednesday - 6 mg, thursday - 6 mg, friday - 5 mg, saturday - 6 mg    Coumadin 1 Mg Tabs (Warfarin sodium) ..... Sunday - 5 mg, monday - 5 mg, tuesday - 6 mg, wednesday - 6 mg, thursday - 6 mg, friday - 5 mg, saturday - 6 mg His updated medication list for this problem includes:    Coumadin 4 Mg Tabs (Warfarin sodium) ..... Sunday -  6 mg, monday - 5 mg, tuesday - 6 mg, wednesday - 6 mg, thursday - 6 mg, friday - 6 mg, saturday - 6 mg    Coumadin 1 Mg Tabs (Warfarin sodium) ..... Sunday - 6 mg, monday - 5 mg, tuesday - 6 mg, wednesday - 6 mg, thursday - 6 mg, friday - 6 mg, saturday - 6 mg  Orders: Fingerstick ([address]) Protime INR (60454)  Complete Medication List: 1)  Citalopram Hydrobromide 20 Mg Tabs (Citalopram hydrobromide) .... Take one tablet by mouth once a day 2)  Omeprazole 20 Mg Cpdr (Omeprazole) .... Take one tablet by mouth once a day 3)  Ondansetron Hcl 4 Mg Tabs (Ondansetron hcl) .... One tablet by mouth every 4 hours 4)  Promethazine Hcl 12.5 Mg Tabs (Promethazine hcl) .... One tablet by mouth every 6 hours 5)  Multivitamins Caps (Multiple vitamin) .... One tablet by mouth once a day 6)  Fentanyl 25 Mcg/hr Pt72 (Fentanyl) .... Apply to skin eveyr 3 days the replace. 7)  Coumadin 4 Mg Tabs (Warfarin sodium) .... Sunday - 6 mg, monday - 5 mg, tuesday - 6 mg, wednesday - 6 mg, thursday - 6 mg, friday - 6 mg, saturday - 6 mg 8)  Coumadin 1 Mg Tabs (Warfarin sodium) .... Sunday - 6 mg, monday - 5 mg, tuesday - 6 mg, wednesday - 6 mg, thursday - 6 mg, friday - 6 mg, saturday - 6 mg  Anticoagulation Management Assessment/Plan:      The target INR is 2.0-3.0.  He is to have a PT/INR in 2 weeks.  Anticoagulation instructions were given to patient.         Current Anticoagulation Instructions: The patient's dosage of coumadin will be increased.  The new dosage includes: Coumadin 4 mg tabs and Coumadin 1 mg tabs:  Sunday - 6 mg, Monday - 5 mg, Tuesday - 6 mg, Wednesday - 6 mg, Thursday - 6 mg, Friday - 6 mg, Saturday - 6 mg.  Repeat PT/INR in 2 weeks.   Prescriptions: FENTANYL 25 MCG/HR PT72 (FENTANYL) apply to skin eveyr 3 days The replace.  #10 ptaches x 0   Entered by:   Avon Gully CMA, (AAMA)   Authorized by:   Nani Gasser MD   Signed by:   Avon Gully CMA, (AAMA) on 06/06/2010   Method used:    Print then Give to Patient   RxID:   [id number]    Orders Added: 1)  Fingerstick [36416] 2)  Protime INR [85610] 3)  T-Comprehensive Metabolic Panel [[id number]] 4)  T-Lipid Profile [[id number]] 5)  T-Amylase [82150-23210] 6)  T-Lipase [83690-23215] 7)  Est. Patient Level III [30865]    Laboratory Results   Blood Tests   Date/Time Received: 06/06/10 Date/Time Reported: 06/06/10  INR: 1.8   (Normal Range: 0.88-1.12   Therap INR: 2.0-3.5)

## 2010-06-26 NOTE — Assessment & Plan Note (Signed)
Summary: PT/INR  Nurse Visit   Allergies: No Known Drug Allergies Laboratory Results   Blood Tests   Date/Time Received: 06/20/10 Date/Time Reported: 06/20/10   INR: 1.9   (Normal Range: 0.88-1.12   Therap INR: 2.0-3.5)    Orders Added: 1)  Fingerstick [36416] 2)  Protime INR [85610]   Anticoagulation Management History:      The patient is on coumadin and comes in today for a routine follow up visit.  Anticoagulation is being administered due to the first episode of deep venous thrombosis and/or pulmonary embolism.  Anticipated length of treatment is 6 months.  His last INR was 1.8 and today's INR is 1.9.    Anticoagulation Management Assessment/Plan:      The target INR is 2.0-3.0.  He is to have a PT/INR in 3 weeks.  Anticoagulation instructions were given to patient.         Current Anticoagulation Instructions: The patient's dosage of coumadin will be increased.  The new dosage includes:  Coumadin 6 mg tabs and Coumadin 1 mg tabs:  Sunday - 6 mg, Monday - 6 mg, Tuesday - 6 mg, Wednesday - 7 mg, Thursday - 6 mg, Friday - 6 mg, Saturday - 6 mg.    Repeat PT/INR in 3 weeks.

## 2010-06-26 NOTE — Assessment & Plan Note (Signed)
Summary: INR check  Nurse Visit   Vital Signs:  Patient profile:   60 year old male Weight:      214 pounds  Vitals Entered By: Payton Spark CMA (May 20, 2010 10:12 AM)  Allergies: No Known Drug Allergies Laboratory Results   Blood Tests      INR: 1.7   (Normal Range: 0.88-1.12   Therap INR: 2.0-3.5)    Orders Added: 1)  Fingerstick [36416] 2)  Protime [04540JW] Prescriptions: OMEPRAZOLE 20 MG CPDR (OMEPRAZOLE) take one tablet by mouth once a day  #30 Capsule x 2   Entered by:   Payton Spark CMA   Authorized by:   Nani Gasser MD   Signed by:   Payton Spark CMA on 05/20/2010   Method used:   [facility]        [facility] [phone]* (retail)       [address]       [place], [place]  [address]       Ph: [phone] or [phone]       Fax: [phone]   RxID:   [id number]    Anticoagulation Management History:      The patient is on coumadin and comes in today for a routine follow up visit.  Coumadin therapy is being given due to the first episode of deep venous thrombosis and/or pulmonary embolism.  His last INR was 2.0 and today's INR is 1.7.    Anticoagulation Management Assessment/Plan:      The target INR is 2.0-3.0.  He is to have a PT/INR in 1 week.         Current Anticoagulation Instructions: The patient's dosage of coumadin will be increased.  The new dosage includes: Repeat PT/INR in 1 week.  Coumadin 4 mg tabs and Coumadin 1 mg tabs:  Sunday - 5 mg, Monday - 5 mg, Tuesday - 6 mg, Wednesday - 5 mg, Thursday - 6 mg, Friday - 5 mg, Saturday - 6 mg.

## 2010-06-26 NOTE — Assessment & Plan Note (Signed)
Summary: 1 week INR check  Nurse Visit   Anticoagulation Management History:      The patient is on coumadin and comes in today for a routine follow up visit.  Coumadin therapy is being given due to the first episode of deep venous thrombosis and/or pulmonary embolism.  His last INR was 1.7 and today's INR is 1.8.     Allergies: No Known Drug Allergies Laboratory Results   Blood Tests   Date/Time Received: 05/27/09 Date/Time Reported: 05/27/09   INR: 1.8   (Normal Range: 0.88-1.12   Therap INR: 2.0-3.5)    Orders Added: 1)  Fingerstick [36416] 2)  Protime INR [85610]   Anticoagulation Management Assessment/Plan:      The target INR is 2.0-3.0.  He is to have a PT/INR in 1 week.  Anticoagulation instructions were given to patient.         Current Anticoagulation Instructions: The patient's dosage of coumadin will be increased.  The new dosage includes: Coumadin 4 mg tabs and Coumadin 1 mg tabs:  Sunday - 5 mg, Monday - 5 mg, Tuesday - 6 mg, Wednesday - 6 mg, Thursday - 6 mg, Friday - 5 mg, Saturday - 6 mg.  Repeat PT/INR in 1 week.

## 2010-06-27 NOTE — Medication Information (Signed)
Summary: Med Issue Order/[facility]  Med Issue Order/[facility]   Imported By: Lanelle Bal 02/18/2010 10:59:00  _____________________________________________________________________  External Attachment:    Type:   Image     Comment:   External Document

## 2010-07-07 ENCOUNTER — Telehealth: Payer: Self-pay | Admitting: Family Medicine

## 2010-07-11 ENCOUNTER — Ambulatory Visit (INDEPENDENT_AMBULATORY_CARE_PROVIDER_SITE_OTHER): Payer: BC Managed Care – PPO

## 2010-07-11 ENCOUNTER — Ambulatory Visit: Payer: Self-pay | Admitting: Family Medicine

## 2010-07-11 ENCOUNTER — Encounter: Payer: Self-pay | Admitting: Family Medicine

## 2010-07-11 DIAGNOSIS — Z7901 Long term (current) use of anticoagulants: Secondary | ICD-10-CM

## 2010-07-11 LAB — CONVERTED CEMR LAB: INR: 2.9

## 2010-07-16 NOTE — Progress Notes (Signed)
Summary: [facility]-Refill Fentanyl  Phone Note Refill Request Message from:  Daughter  Refills Requested: Medication #1:  FENTANYL 25 MCG/HR PT72 apply to skin eveyr 3 days The replace.   Supply Requested: 1 month   Last Refilled: 06/06/2010 Please call when ready.   Method Requested: Pick up at Office Initial call taken by: Francee Piccolo CMA (AAMA),  July 07, 2010 10:14 AM    Prescriptions: FENTANYL 25 MCG/HR PT72 (FENTANYL) apply to skin eveyr 3 days The replace.  #10 ptaches x 0   Entered and Authorized by:   Nani Gasser MD   Signed by:   Nani Gasser MD on 07/07/2010   Method used:   Print then Give to Patient   RxID:   [id number]

## 2010-07-16 NOTE — Assessment & Plan Note (Signed)
Summary: INR  Nurse Visit   Allergies: No Known Drug Allergies Laboratory Results   Blood Tests   Date/Time Received: 07/11/10 Date/Time Reported: 07/11/10   INR: 2.9   (Normal Range: 0.88-1.12   Therap INR: 2.0-3.5)    Orders Added: 1)  Fingerstick [36416] 2)  Protime INR [85610]   Anticoagulation Management History:      The patient is on coumadin and comes in today for a routine follow up visit.  Anticoagulation is being administered due to the first episode of deep venous thrombosis and/or pulmonary embolism.  Anticipated length of treatment is 6 months.  His last INR was 1.9 and today's INR is 2.9.    Anticoagulation Management Assessment/Plan:      The target INR is 2.0-3.0.  He is to have a PT/INR in 2 weeks.  Anticoagulation instructions were given to patient.         Current Anticoagulation Instructions: The patient is to continue with the same dose of coumadin.  This dosage includes: Coumadin 6 mg tabs and Coumadin 1 mg tabs:  Sunday - 6 mg, Monday - 6 mg, Tuesday - 6 mg, Wednesday - 7 mg, Thursday - 6 mg, Friday - 6 mg, Saturday - 6 mg.  Repeat PT/INR in 2 weeks.

## 2010-07-22 ENCOUNTER — Telehealth: Payer: Self-pay | Admitting: Family Medicine

## 2010-07-25 ENCOUNTER — Encounter: Payer: Self-pay | Admitting: Family Medicine

## 2010-07-25 ENCOUNTER — Ambulatory Visit (INDEPENDENT_AMBULATORY_CARE_PROVIDER_SITE_OTHER): Payer: BC Managed Care – PPO | Admitting: Family Medicine

## 2010-07-25 DIAGNOSIS — Z7901 Long term (current) use of anticoagulants: Secondary | ICD-10-CM

## 2010-07-25 DIAGNOSIS — I2699 Other pulmonary embolism without acute cor pulmonale: Secondary | ICD-10-CM

## 2010-07-25 LAB — CONVERTED CEMR LAB: INR: 3.1

## 2010-07-31 NOTE — Progress Notes (Signed)
Summary: Refill Celexa  Phone Note Refill Request Call back at Home Phone [phone]   Refills Requested: Medication #1:  CITALOPRAM HYDROBROMIDE 20 MG TABS take one tablet by mouth once a day   Dosage confirmed as above?Dosage Confirmed  Method Requested: Electronic Next Appointment Scheduled: 07/25/10 Initial call taken by: Francee Piccolo CMA Duncan Dull),  July 22, 2010 2:38 PM    Prescriptions: CITALOPRAM HYDROBROMIDE 20 MG TABS (CITALOPRAM HYDROBROMIDE) take one tablet by mouth once a day  #90 x 0   Entered by:   Avon Gully CMA, (AAMA)   Authorized by:   Nani Gasser MD   Signed by:   Avon Gully CMA, (AAMA) on 07/23/2010   Method used:   [facility] to        [facility] [phone]* (retail)       [address] [place], [place]  [address]       Ph: [phone] or [phone]       Fax: [phone]   RxID:   [id number]

## 2010-07-31 NOTE — Assessment & Plan Note (Signed)
Summary: INR   Vital Signs:  Patient profile:   60 year old male Weight:      233 pounds  Vitals Entered By: Payton Spark CMA (July 25, 2010 9:41 AM)  Anticoagulation Management History:      The patient is on coumadin and comes in today for a routine follow up visit.  Anticoagulation is being administered due to the first episode of deep venous thrombosis and/or pulmonary embolism.  Anticipated length of treatment is 6 months.  His last INR was 2.9 and today's INR is 3.1.    Allergies: No Known Drug Allergies   Complete Medication List: 1)  Citalopram Hydrobromide 20 Mg Tabs (Citalopram hydrobromide) .... Take one tablet by mouth once a day 2)  Omeprazole 20 Mg Cpdr (Omeprazole) .... Take one tablet by mouth once a day 3)  Ondansetron Hcl 4 Mg Tabs (Ondansetron hcl) .... One tablet by mouth every 4 hours 4)  Promethazine Hcl 12.5 Mg Tabs (Promethazine hcl) .... One tablet by mouth every 6 hours 5)  Multivitamins Caps (Multiple vitamin) .... One tablet by mouth once a day 6)  Fentanyl 25 Mcg/hr Pt72 (Fentanyl) .... Apply to skin eveyr 3 days the replace. 7)  Fish Oil 1000 Mg Caps (Omega-3 fatty acids) .... 4  caps by mouth daily. 8)  Coumadin 6 Mg Tabs (Warfarin sodium) .... Sunday - 6 mg, monday - 6 mg, tuesday - 6 mg, wednesday - 6 mg, thursday - 6 mg, friday - 6 mg, saturday - 6 mg 9)  Coumadin 1 Mg Tabs (Warfarin sodium) .... Sunday - 6 mg, monday - 6 mg, tuesday - 6 mg, wednesday - 6 mg, thursday - 6 mg, friday - 6 mg, saturday - 6 mg  Other Orders: Fingerstick (04540) Protime INR (98119)  Anticoagulation Management Assessment/Plan:      The target INR is 2.0-3.0.  He is to have a PT/INR in 1 week.         Current Anticoagulation Instructions: The patient is to hold (do not take) the Friday dose of coumadin.  The dosage to be resumed includes: The patient's dosage of coumadin will be decreased.  The new dosage includes: Coumadin 6 mg tabs and Coumadin 1 mg tabs:  Sunday -  6 mg, Monday - 6 mg, Tuesday - 6 mg, Wednesday - 6 mg, Thursday - 6 mg, Friday - 6 mg, Saturday - 6 mg.  Repeat PT/INR in 1 week.     Orders Added: 1)  Fingerstick [36416] 2)  Protime INR [[id number]]    Laboratory Results   Blood Tests      INR: 3.1   (Normal Range: 0.88-1.12   Therap INR: 2.0-3.5)

## 2010-08-01 ENCOUNTER — Ambulatory Visit (INDEPENDENT_AMBULATORY_CARE_PROVIDER_SITE_OTHER): Payer: BC Managed Care – PPO

## 2010-08-01 ENCOUNTER — Encounter: Payer: Self-pay | Admitting: Family Medicine

## 2010-08-01 DIAGNOSIS — Z7901 Long term (current) use of anticoagulants: Secondary | ICD-10-CM

## 2010-08-05 NOTE — Assessment & Plan Note (Signed)
Summary: PT/INR  Nurse Visit   Allergies: No Known Drug Allergies Laboratory Results   Blood Tests   Date/Time Received: 08/01/10 Date/Time Reported: 08/01/10   INR: 2.7   (Normal Range: 0.88-1.12   Therap INR: 2.0-3.5)    Orders Added: 1)  Fingerstick [36416] 2)  Protime INR [85610]   Anticoagulation Management History:      The patient is on coumadin and comes in today for a routine follow up visit.  The patient is taking Coumadin for the first episode of deep venous thrombosis and/or pulmonary embolism.  Anticipated length of treatment is 6 months.  His last INR was 3.1 and today's INR is 2.7.    Anticoagulation Management Assessment/Plan:      The target INR is 2.0-3.0.  He is to have a PT/INR in 2 weeks.  Anticoagulation instructions were given to patient.         Current Anticoagulation Instructions: The patient is to continue with the same dose of coumadin.  This dosage includes: Coumadin 6 mg tabs and Coumadin 1 mg tabs:  Sunday - 6 mg, Monday - 6 mg, Tuesday - 6 mg, Wednesday - 6 mg, Thursday - 6 mg, Friday - 6 mg, Saturday - 6 mg.  Repeat PT/INR in 2 weeks.

## 2010-08-15 ENCOUNTER — Telehealth: Payer: Self-pay | Admitting: Family Medicine

## 2010-08-15 ENCOUNTER — Ambulatory Visit (INDEPENDENT_AMBULATORY_CARE_PROVIDER_SITE_OTHER): Payer: BC Managed Care – PPO | Admitting: Family Medicine

## 2010-08-15 DIAGNOSIS — Z7901 Long term (current) use of anticoagulants: Secondary | ICD-10-CM

## 2010-08-15 LAB — PROTIME-INR: INR: 2.41 — ABNORMAL HIGH (ref <1.50)

## 2010-08-15 NOTE — Telephone Encounter (Signed)
Call pt: Continue current coumadin regimen.  Sunday- 6 mg, Monday- 6mg , Tuesday- 6mg , Wednesday- 6mg , Thursday- 6mg , Friday- 6mg , Saturday- 6mg 

## 2010-08-15 NOTE — Progress Notes (Signed)
  Subjective:    Patient ID: Christopher [name], male    DOB: [birth year], 60 y.o.   MRN: [id number]  HPI    Review of Systems     Objective:   Physical Exam        Assessment & Plan:  We didn't have strips to rune his anticaog so sent to the lab.

## 2010-08-18 NOTE — Telephone Encounter (Signed)
Pt.notified

## 2010-08-21 ENCOUNTER — Other Ambulatory Visit: Payer: Self-pay | Admitting: Family Medicine

## 2010-08-21 DIAGNOSIS — K219 Gastro-esophageal reflux disease without esophagitis: Secondary | ICD-10-CM

## 2010-09-01 ENCOUNTER — Ambulatory Visit (INDEPENDENT_AMBULATORY_CARE_PROVIDER_SITE_OTHER): Payer: BC Managed Care – PPO | Admitting: Family Medicine

## 2010-09-01 DIAGNOSIS — I2699 Other pulmonary embolism without acute cor pulmonale: Secondary | ICD-10-CM

## 2010-09-01 LAB — POCT INR: INR: 2.8

## 2010-09-22 ENCOUNTER — Ambulatory Visit (INDEPENDENT_AMBULATORY_CARE_PROVIDER_SITE_OTHER): Payer: BC Managed Care – PPO | Admitting: Family Medicine

## 2010-09-22 ENCOUNTER — Telehealth: Payer: Self-pay | Admitting: *Deleted

## 2010-09-22 DIAGNOSIS — I2699 Other pulmonary embolism without acute cor pulmonale: Secondary | ICD-10-CM

## 2010-09-22 LAB — POCT INR: INR: 2.5

## 2010-09-22 NOTE — Telephone Encounter (Signed)
Pt wants to wean himsekf off of celexa and wants to know how to do that

## 2010-09-22 NOTE — Telephone Encounter (Signed)
Dec celex to 1/2 tab daily for 2 weeks, then every other day for 2 weeks adn then stop

## 2010-09-23 NOTE — Telephone Encounter (Signed)
Pt.notified

## 2010-10-21 ENCOUNTER — Ambulatory Visit (INDEPENDENT_AMBULATORY_CARE_PROVIDER_SITE_OTHER): Payer: BC Managed Care – PPO | Admitting: Family Medicine

## 2010-10-21 ENCOUNTER — Telehealth: Payer: Self-pay | Admitting: *Deleted

## 2010-10-21 ENCOUNTER — Ambulatory Visit (HOSPITAL_COMMUNITY)
Admission: RE | Admit: 2010-10-21 | Discharge: 2010-10-21 | Disposition: A | Discharge status: Home / Self Care | Payer: BC Managed Care – PPO | Source: Ambulatory Visit | Attending: Family Medicine | Admitting: Family Medicine

## 2010-10-21 VITALS — BP 128/78 | HR 58 | Wt 227.0 lb

## 2010-10-21 DIAGNOSIS — M7989 Other specified soft tissue disorders: Secondary | ICD-10-CM | POA: Insufficient documentation

## 2010-10-21 DIAGNOSIS — I2699 Other pulmonary embolism without acute cor pulmonale: Secondary | ICD-10-CM

## 2010-10-21 LAB — POCT INR: INR: 1.6

## 2010-10-21 MED ORDER — WARFARIN SODIUM 6 MG PO TABS: 6.0000 mg | ORAL_TABLET | Freq: Every day | ORAL | Status: DC

## 2010-10-21 NOTE — Telephone Encounter (Signed)
Called pt to ask where he wanted to go for venous doppler. [facility]

## 2010-10-21 NOTE — Progress Notes (Signed)
  Subjective:    Patient ID: Christopher [name], male    DOB: [birth year], 60 y.o.   MRN: [id number]  HPI  inr check; pt has swelling in lower legs and a knot on the left calf for approx 1.5 weeks.   Review of Systems     Objective:   Physical Exam        Assessment & Plan:

## 2010-10-31 ENCOUNTER — Ambulatory Visit (INDEPENDENT_AMBULATORY_CARE_PROVIDER_SITE_OTHER): Payer: BC Managed Care – PPO | Admitting: Family Medicine

## 2010-10-31 DIAGNOSIS — I809 Phlebitis and thrombophlebitis of unspecified site: Secondary | ICD-10-CM

## 2010-11-14 ENCOUNTER — Ambulatory Visit (INDEPENDENT_AMBULATORY_CARE_PROVIDER_SITE_OTHER): Payer: BC Managed Care – PPO | Admitting: Family Medicine

## 2010-11-14 VITALS — Wt 224.0 lb

## 2010-11-14 DIAGNOSIS — I82409 Acute embolism and thrombosis of unspecified deep veins of unspecified lower extremity: Secondary | ICD-10-CM

## 2010-11-19 ENCOUNTER — Telehealth: Payer: Self-pay | Admitting: Family Medicine

## 2010-11-19 NOTE — Telephone Encounter (Signed)
Daughter called and said her dad had past history of peptic, bile, duodenum leaks from surgery process for peptic ulcers.  Released as recent as 05-2010.  Had fever last night of 100.4 orally, and abdominal cramping.   Has been off the Celexa 1 1/2 weeks and Dr. Linford Arnold had told the pt if he started getting dizzy, lightheadedness, they should would  Consider putting him back on celexa. Plan:  Pt's daughter told to call the GI specialist and or surgeon that patient was recently under the care of since recent, and pt having GI issues assoc with fever.  Also, will send message for Dr. Linford Arnold to review for MOnday to make a decision regarding his celexa medication  And the assoc symptoms. Christopher Newcomer, LPN [facility]

## 2010-11-20 MED ORDER — CITALOPRAM HYDROBROMIDE 20 MG PO TABS: 20.0000 mg | ORAL_TABLET | Freq: Every day | ORAL | Status: DC

## 2010-11-20 NOTE — Telephone Encounter (Signed)
I talked to Christopher [name] on the phone.  He had abd pain 2 days ago after picking cucumbers in the yard but no nausea, diarrhea or blood in his stool.  He is on coumadin.  He had abd surgery this past year out of town.  He does not have a GI doc here.  He is currently not having any pain and is able to eat well.  He has felt 'crabby' off the citalopram so wants to go back on it.  His temp was 100.4 2 days ago but came down the next day.  RX to renew citalopram sent to pharmacy to start back on today.  Keep [place] posted on any further abdominal pain.

## 2010-11-21 ENCOUNTER — Ambulatory Visit: Payer: BC Managed Care – PPO | Admitting: Family Medicine

## 2010-12-01 ENCOUNTER — Ambulatory Visit (INDEPENDENT_AMBULATORY_CARE_PROVIDER_SITE_OTHER): Payer: BC Managed Care – PPO | Admitting: Family Medicine

## 2010-12-01 ENCOUNTER — Telehealth: Payer: Self-pay | Admitting: Family Medicine

## 2010-12-01 ENCOUNTER — Encounter: Payer: Self-pay | Admitting: Family Medicine

## 2010-12-01 VITALS — BP 130/72 | Wt 220.0 lb

## 2010-12-01 DIAGNOSIS — I2699 Other pulmonary embolism without acute cor pulmonale: Secondary | ICD-10-CM

## 2010-12-01 DIAGNOSIS — I82409 Acute embolism and thrombosis of unspecified deep veins of unspecified lower extremity: Secondary | ICD-10-CM

## 2010-12-01 NOTE — Telephone Encounter (Signed)
Advised pt of results of INR and change in dosing.

## 2010-12-15 ENCOUNTER — Other Ambulatory Visit: Payer: Self-pay | Admitting: *Deleted

## 2010-12-15 ENCOUNTER — Ambulatory Visit: Payer: BC Managed Care – PPO | Admitting: Family Medicine

## 2010-12-15 ENCOUNTER — Telehealth: Payer: Self-pay | Admitting: *Deleted

## 2010-12-15 DIAGNOSIS — I2699 Other pulmonary embolism without acute cor pulmonale: Secondary | ICD-10-CM

## 2010-12-15 MED ORDER — WARFARIN SODIUM 6 MG PO TABS: 6.0000 mg | ORAL_TABLET | Freq: Every day | ORAL | Status: DC

## 2010-12-15 NOTE — Telephone Encounter (Signed)
Pt notified of INR results and will send refill for 6mg  warfarin per pt request

## 2010-12-31 ENCOUNTER — Encounter: Payer: Self-pay | Admitting: Family Medicine

## 2011-01-01 ENCOUNTER — Encounter: Payer: Self-pay | Admitting: Family Medicine

## 2011-01-01 ENCOUNTER — Ambulatory Visit (INDEPENDENT_AMBULATORY_CARE_PROVIDER_SITE_OTHER): Payer: BC Managed Care – PPO | Admitting: Family Medicine

## 2011-01-01 ENCOUNTER — Telehealth: Payer: Self-pay | Admitting: Family Medicine

## 2011-01-01 VITALS — BP 124/79 | HR 56 | Wt 220.0 lb

## 2011-01-01 DIAGNOSIS — Z23 Encounter for immunization: Secondary | ICD-10-CM

## 2011-01-01 DIAGNOSIS — I2699 Other pulmonary embolism without acute cor pulmonale: Secondary | ICD-10-CM

## 2011-01-01 DIAGNOSIS — K859 Acute pancreatitis without necrosis or infection, unspecified: Secondary | ICD-10-CM

## 2011-01-01 DIAGNOSIS — Z2911 Encounter for prophylactic immunotherapy for respiratory syncytial virus (RSV): Secondary | ICD-10-CM

## 2011-01-01 NOTE — Progress Notes (Signed)
  Subjective:    Patient ID: Christopher [name], male    DOB: May 07, 1951, 60 y.o.   MRN: [id number]  HPI WAs admitted to the hosp for pancreatitis on 12/25/10.  This is his second bought. Doing well since d/c on 8/4. No fever or abdominal pain. Back up to a normal diet . No nausea. Reviewed records from [facility]. Thy also saw a liesion on his right kidney.  He does have a prior history of slightly elevated triglycerides but not very high. He does not drink alcohol. The last time he had pancreatitis it was after abdominal surgery.  Pulmonary embolism-he has been on Coumadin for 12 months at this point in time. They felt that the PE was secondary to immobility. After his surgery he was practically bedbound for about 6 months. He has done well since then. He has not had any recent bleeding problems.  Review of Systems     Objective:   Physical Exam  Constitutional: He is oriented to person, place, and time. He appears well-developed and well-nourished.  HENT:  Head: Normocephalic and atraumatic.  Cardiovascular: Normal rate, regular rhythm and normal heart sounds.   Pulmonary/Chest: Effort normal and breath sounds normal.  Abdominal: Soft. Bowel sounds are normal. He exhibits no distension and no mass. There is no tenderness. There is no rebound and no guarding.  Musculoskeletal: He exhibits no edema.  Neurological: He is alert and oriented to person, place, and time.  Skin: Skin is warm and dry.  Psychiatric: He has a normal mood and affect. His behavior is normal. Judgment and thought content normal.   multiple well-healed surgical scars on his abdomen.        Assessment & Plan:  Pulmonary embolism-I discussed that we'll doublecheck the chest, and some extra recommendations have not changed but I believe for a first PE with a known cause the regimen is 6-12 months in which case he would be a discontinued his Coumadin.   Pancreatitis-appears to be resolving. He is back to her normal diet has not  had any pain. His abdominal exam is fairly normal today. I don't have any known cause for his pancreatitis. We do not need to recheck enzymes today as he is doing very well.

## 2011-01-01 NOTE — Telephone Encounter (Signed)
Call pt:  Guidelines say if first PE and has a cause (immobilization) then the tx is 3 months.  SO can dicontinue the coumadin at this point. Can comes in a see the nurse anytime for a tetanus vaccine whenever he would like.

## 2011-01-02 ENCOUNTER — Ambulatory Visit: Payer: BC Managed Care – PPO

## 2011-01-02 NOTE — Telephone Encounter (Signed)
Pt's daughter notified. Daughter wants to know if pt can get a chest xray just to make sure before he stops taking his coumadin

## 2011-01-03 NOTE — Telephone Encounter (Signed)
We can. Order entered.

## 2011-01-05 NOTE — Telephone Encounter (Signed)
Left message on cell

## 2011-01-12 ENCOUNTER — Telehealth: Payer: Self-pay | Admitting: *Deleted

## 2011-01-12 DIAGNOSIS — N281 Cyst of kidney, acquired: Secondary | ICD-10-CM

## 2011-01-12 NOTE — Telephone Encounter (Signed)
Pt daughter called and states she has not heard anything back from you on getting her dads sonogram of kidneys done. Please advise

## 2011-01-13 ENCOUNTER — Ambulatory Visit
Admission: RE | Admit: 2011-01-13 | Discharge: 2011-01-13 | Disposition: A | Discharge status: Home / Self Care | Payer: BC Managed Care – PPO | Source: Ambulatory Visit | Attending: Family Medicine | Admitting: Family Medicine

## 2011-01-13 DIAGNOSIS — I2699 Other pulmonary embolism without acute cor pulmonale: Secondary | ICD-10-CM

## 2011-01-13 NOTE — Telephone Encounter (Signed)
Was this patient supposed to have kidney US? I didn't see an order

## 2011-01-14 ENCOUNTER — Telehealth: Payer: Self-pay | Admitting: Family Medicine

## 2011-01-14 NOTE — Telephone Encounter (Signed)
No he has not. He wasn't aware that he had it

## 2011-01-14 NOTE — Telephone Encounter (Signed)
Call pt: CXR just shows COPD.

## 2011-01-14 NOTE — Telephone Encounter (Addendum)
Pt notified and wanted to know if he needed to do anyting about COPD

## 2011-01-14 NOTE — Telephone Encounter (Signed)
I still never got the orgi CT report where they found the cyst on his kidneys from [facility].  Can you call and see if we can get the report.

## 2011-01-14 NOTE — Telephone Encounter (Signed)
He needs to be scheduled for spirometry here in the office. Has her ever had lung testing?

## 2011-01-14 NOTE — Telephone Encounter (Signed)
Ok put him on the sched some time next week or the week after  for spiro. Thanks.

## 2011-01-15 NOTE — Telephone Encounter (Signed)
Sue Lush, Dr. Linford Arnold never got the original CT report from [facility] on his kidneys. When you call him can you ask him if went and if did can you get the report for Dr. Linford Arnold.  Thanks

## 2011-01-16 ENCOUNTER — Encounter: Payer: Self-pay | Admitting: Family Medicine

## 2011-01-16 ENCOUNTER — Ambulatory Visit (INDEPENDENT_AMBULATORY_CARE_PROVIDER_SITE_OTHER): Payer: BC Managed Care – PPO | Admitting: Family Medicine

## 2011-01-16 VITALS — BP 127/83 | HR 76 | Wt 228.0 lb

## 2011-01-16 DIAGNOSIS — J449 Chronic obstructive pulmonary disease, unspecified: Secondary | ICD-10-CM

## 2011-01-16 DIAGNOSIS — Z23 Encounter for immunization: Secondary | ICD-10-CM

## 2011-01-16 DIAGNOSIS — M549 Dorsalgia, unspecified: Secondary | ICD-10-CM

## 2011-01-16 MED ORDER — ALBUTEROL SULFATE HFA 108 (90 BASE) MCG/ACT IN AERS: 2.0000 | INHALATION_SPRAY | Freq: Four times a day (QID) | RESPIRATORY_TRACT | Status: DC | PRN

## 2011-01-16 MED ORDER — HYDROCODONE-ACETAMINOPHEN 5-325 MG PO TABS: 1.0000 | ORAL_TABLET | ORAL | Status: DC | PRN

## 2011-01-16 NOTE — Telephone Encounter (Signed)
Sent med rec request for ct of kidneys

## 2011-01-16 NOTE — Progress Notes (Signed)
  Subjective:    Patient ID: Christopher [name], male    DOB: 1950-08-13, 60 y.o.   MRN: [id number]  HPI Pain over his sacrum years.  Has been getting worse. WAs so bad on Sat that he could barely walk.  A little better the last couple of days. Back has been able to work in his garden. No radiation into her leg until today. He says it does seem to go down the back of his right upper thigh. It does not go all the way to the knee..  Bending over makes it worse. Took some old pain pills from pancreatitis and that did seem to help. Not worsening with walking. Hurt his back around age 31 after lifting a tire. Went to see a Chiropodist and had that actually resolved his pain. He wonders about seeing a chiropractor now would be helpful for him or not.  He is also here for spirometry today. We recently did a chest x-ray and it was noted that he had some emphysematous changes. He quit smoking approximately 8 years ago but prior to that was a 3-4 pack a day smoker. He says overall he is still active. He does note if he walks up he only gets a little bit more winded or short of breath. He has no symptoms at rest. He has no symptoms for short distances. He does notice occasional cough. He does report a small amount of productive sputum in the mornings. No recent fever. He's never been tested for diagnosed with COPD. He is in a [employer] for years and has been exposed to wood dust for years..    Review of Systems     Objective:   Physical Exam  Constitutional: He appears well-developed and well-nourished.  Musculoskeletal:       He has normal flexion and extension of his lumbar spine. He is tender over the right sacral paraspinous muscles. He's mildly tender over the right SI joint that he is most tender about an inch below that. He has a negative straight leg raise. Hip, knee, ankle strength is 5 out 5 bilaterally. Patellar reflexes are 1+ bilaterally.          Assessment & Plan:  Acute lumbar back  pain-he is actually feeling better the last couple days. I recommend gentle stretching. Certainly if he would like to see a chiropractor this could be very helpful for him. If not we can always get an x-ray to take a better look to see if there could be possible disc herniation maybe even from his old injury, back when he was 60 years old. Because of his history of perforated intestine I recommend avoiding any NSAIDs. Certainly he can use Tylenol for pain if needed. I did also give him a small prescription of hydrocodone to use when necessary. Did not bring in the next 3-4 weeks I recommend further evaluation and possible physical therapy.

## 2011-01-16 NOTE — Assessment & Plan Note (Signed)
We discussed his results and I reviewed the spirometry report with him. I let him know that he does have mild to moderate COPD. He is already quit smoking about 8 years ago which is fantastic. Encouraged him to never start smoking again. Also we discussed that based on the guidelines it is recommended that he have an as needed albuterol inhaler. Also given a prescription to his pharmacy. His flu vaccine and his tetanus vaccines were updated today. Recommend repeat spirometry in one year.

## 2011-01-19 ENCOUNTER — Encounter: Payer: Self-pay | Admitting: Family Medicine

## 2011-01-20 ENCOUNTER — Telehealth: Payer: Self-pay | Admitting: Family Medicine

## 2011-01-20 DIAGNOSIS — M545 Low back pain, unspecified: Secondary | ICD-10-CM

## 2011-01-20 NOTE — Telephone Encounter (Signed)
Pt's daughter called and said pt was contacted by [facility] downstairs to get his kidney sonogram scheduled, but the dauhgter called because she said the pt was also suppose to have a xray of the lumbar, but [place] downstairs did not have an order for that.  Please advise. PLAN:  Routed to Dr. Marlyne Beards, LPN [facility]

## 2011-01-20 NOTE — Telephone Encounter (Signed)
Order entered should be in now.

## 2011-01-20 NOTE — Telephone Encounter (Signed)
Notified pt that a xray for the lumbar (back) had been entered into the system, and that he could go ahead and call Sondra back at the imaging office downstairs to get the test scheduled.  LMOM with these instructions. Jarvis Newcomer, LPN [facility]

## 2011-01-23 ENCOUNTER — Telehealth: Payer: Self-pay | Admitting: Family Medicine

## 2011-01-23 ENCOUNTER — Ambulatory Visit
Admission: RE | Admit: 2011-01-23 | Discharge: 2011-01-23 | Disposition: A | Discharge status: Home / Self Care | Payer: BC Managed Care – PPO | Source: Ambulatory Visit | Attending: Family Medicine | Admitting: Family Medicine

## 2011-01-23 DIAGNOSIS — N281 Cyst of kidney, acquired: Secondary | ICD-10-CM

## 2011-01-23 DIAGNOSIS — M545 Low back pain, unspecified: Secondary | ICD-10-CM

## 2011-01-23 NOTE — Telephone Encounter (Signed)
Pt notified and will let us know if he wants PT

## 2011-01-23 NOTE — Telephone Encounter (Addendum)
Call pt: US showed cyst looks benign on [place] but they recommend repeat US in 1 year. Xray of lumbar spine shows some arthritis and some disc space narrowing at L5-S1. THis is usually a sign of a disc herniation. Usually we recommend PT first. Then if not getting better can consider an MRI. Let me know if wants to do the PT.

## 2011-02-11 ENCOUNTER — Other Ambulatory Visit: Payer: Self-pay | Admitting: Family Medicine

## 2011-02-11 MED ORDER — CITALOPRAM HYDROBROMIDE 20 MG PO TABS: 20.0000 mg | ORAL_TABLET | Freq: Every day | ORAL | Status: DC

## 2011-02-11 NOTE — Telephone Encounter (Signed)
Received fax for refill request for citalopram 20 mg.  Will be due for refill as of 02-20-11.   Plan:  Refilled for # 30 /2 refills. Jarvis Newcomer, LPN [facility]

## 2011-02-23 ENCOUNTER — Other Ambulatory Visit: Payer: Self-pay | Admitting: Family Medicine

## 2011-02-23 NOTE — Telephone Encounter (Signed)
Pt called to request refill on his celexa 20 mg.   Plan:  Reviewed pt chart file and the medication was sent on 02-11-11.  Pt informed. Jarvis Newcomer, LPN [facility]

## 2011-05-21 ENCOUNTER — Other Ambulatory Visit: Payer: Self-pay | Admitting: Family Medicine

## 2011-07-07 ENCOUNTER — Encounter: Payer: Self-pay | Admitting: Family Medicine

## 2011-07-07 ENCOUNTER — Ambulatory Visit (INDEPENDENT_AMBULATORY_CARE_PROVIDER_SITE_OTHER): Payer: BC Managed Care – PPO | Admitting: Family Medicine

## 2011-07-07 VITALS — BP 145/94 | HR 82 | Wt 256.0 lb

## 2011-07-07 DIAGNOSIS — L98499 Non-pressure chronic ulcer of skin of other sites with unspecified severity: Secondary | ICD-10-CM

## 2011-07-07 DIAGNOSIS — F3289 Other specified depressive episodes: Secondary | ICD-10-CM

## 2011-07-07 DIAGNOSIS — J449 Chronic obstructive pulmonary disease, unspecified: Secondary | ICD-10-CM

## 2011-07-07 DIAGNOSIS — E669 Obesity, unspecified: Secondary | ICD-10-CM | POA: Insufficient documentation

## 2011-07-07 DIAGNOSIS — F32A Depression, unspecified: Secondary | ICD-10-CM

## 2011-07-07 DIAGNOSIS — F329 Major depressive disorder, single episode, unspecified: Secondary | ICD-10-CM

## 2011-07-07 NOTE — Patient Instructions (Signed)

## 2011-07-07 NOTE — Progress Notes (Signed)
  Subjective:    Patient ID: Christopher [name], male    DOB: 03-24-1951, 61 y.o.   MRN: [id number]  HPI Will using albuterol occ with activity such as loading hay, etc.  No CP. Doing well today.  Had spiro in 12/2010 showing mild to mod COPD.    Says has gained some weight bc not as active lately.  He has ben eating more too.  Has stopped drinking coffee bc was irritating his stomach. Not on any PPI or H2 blocker.    Depression - Mood has been OK.  Now has disability straightened out and that has helped. Says happy with current regimen. Taknig meds consistantly w/ SE.    Review of Systems     Objective:   Physical Exam  Constitutional: He is oriented to person, place, and time. He appears well-developed and well-nourished.  HENT:  Head: Normocephalic and atraumatic.  Cardiovascular: Normal rate, regular rhythm and normal heart sounds.   Pulmonary/Chest: Effort normal and breath sounds normal.  Neurological: He is alert and oriented to person, place, and time.  Skin: Skin is warm and dry.  Psychiatric: He has a normal mood and affect. His behavior is normal.          Assessment & Plan:  Depression - PHQ-9 score of 8.  He is not quite at therapeutic gaol but he would like to continue current dose. F/U in 6 mo.   COPD - due for COPD spirometry in August.  Discussed start a walking program. REally needs to work onweight loss.    Elevated BP - Likely secondary to weight gain.  Discussed working weight and low salt diet so can avoid BP med. Recheck in one month w/ nurse.   Chornic Ulcers - Discussed working on diet and can use PPI or H2 blockers prn. Cut out caffeine which is a huge trigger for him.

## 2011-08-16 ENCOUNTER — Other Ambulatory Visit: Payer: Self-pay | Admitting: Family Medicine

## 2011-09-07 ENCOUNTER — Encounter: Payer: Self-pay | Admitting: *Deleted

## 2011-09-09 ENCOUNTER — Encounter: Payer: Self-pay | Admitting: Family Medicine

## 2011-09-09 ENCOUNTER — Ambulatory Visit (INDEPENDENT_AMBULATORY_CARE_PROVIDER_SITE_OTHER): Payer: BC Managed Care – PPO | Admitting: Family Medicine

## 2011-09-09 VITALS — BP 124/81 | HR 75 | Temp 98.1°F | Ht 74.0 in | Wt 244.0 lb

## 2011-09-09 DIAGNOSIS — K859 Acute pancreatitis without necrosis or infection, unspecified: Secondary | ICD-10-CM

## 2011-09-09 DIAGNOSIS — E785 Hyperlipidemia, unspecified: Secondary | ICD-10-CM

## 2011-09-09 MED ORDER — CITALOPRAM HYDROBROMIDE 20 MG PO TABS: 20.0000 mg | ORAL_TABLET | Freq: Every day | ORAL | Status: DC

## 2011-09-09 MED ORDER — OMEPRAZOLE 20 MG PO CPDR: 20.0000 mg | DELAYED_RELEASE_CAPSULE | Freq: Every day | ORAL | Status: DC

## 2011-09-09 NOTE — Progress Notes (Signed)
  Subjective:    Patient ID: Christopher [name], male    DOB: [birth year], 61 y.o.   MRN: [id number]  HPI Was admitted for 3rd episode of pancreatitis to [facility].  Did an MRI.  Told did have some cysts on the pancreas that will need to be followed. Says doesn't drink alochool. He does have high chol and TG but not super high. Started on priloec and will stay on for awhile.  He is eating normally again.  Has f/u with GI in July. No nausea or vomiting or addominal pain or fever.     Review of Systems     Objective:   Physical Exam  Constitutional: He is oriented to person, place, and time. He appears well-developed and well-nourished.  HENT:  Head: Normocephalic and atraumatic.  Cardiovascular: Normal rate, regular rhythm and normal heart sounds.   Pulmonary/Chest: Effort normal and breath sounds normal.  Abdominal: Soft. Bowel sounds are normal. He exhibits no distension and no mass. There is tenderness. There is no rebound and no guarding.       Mildly tender with deep palpation left upper quadrant. He has well-healed surgical scars over the abdomen.  Neurological: He is alert and oriented to person, place, and time.  Skin: Skin is warm and dry.  Psychiatric: He has a normal mood and affect. His behavior is normal.          Assessment & Plan:  Pancreatitis - Symptomatically he is back to baseline.  Recheck lipase to make sure back to normal. He is eating and drinking normally again. We did discuss working on a low-fat diet. Recheck CBC as well.  Has f/u with GI in July.    Hyperlipidemia - Due to recheck and discuss tx if needed. We did discuss working on a low-fat diet.  H.O given for lower fat food choices.  Recheck chol in 1-2 weeks.   Lab Results  Component Value Date   CHOL 220* 06/11/2010   HDL 36* 06/11/2010   LDLCALC 123* 06/11/2010   TRIG 304* 06/11/2010   CHOLHDL 6.1 Ratio 06/11/2010

## 2011-09-17 ENCOUNTER — Other Ambulatory Visit: Payer: Self-pay | Admitting: Family Medicine

## 2011-09-17 LAB — CBC
Hemoglobin: 14.1 g/dL (ref 13.0–17.0)
MCV: 95.5 fL (ref 78.0–100.0)
Platelets: 227 10*3/uL (ref 150–400)
RBC: 4.44 MIL/uL (ref 4.22–5.81)
WBC: 7.6 10*3/uL (ref 4.0–10.5)

## 2011-09-17 LAB — LIPID PANEL
Cholesterol: 234 mg/dL — ABNORMAL HIGH (ref 0–200)
LDL Cholesterol: 160 mg/dL — ABNORMAL HIGH (ref 0–99)
Triglycerides: 191 mg/dL — ABNORMAL HIGH (ref <150)
VLDL: 38 mg/dL (ref 0–40)

## 2011-09-17 MED ORDER — PRAVASTATIN SODIUM 40 MG PO TABS: 40.0000 mg | ORAL_TABLET | Freq: Every day | ORAL | Status: DC

## 2011-10-12 ENCOUNTER — Other Ambulatory Visit: Payer: Self-pay | Admitting: *Deleted

## 2011-10-12 MED ORDER — PRAVASTATIN SODIUM 40 MG PO TABS: 40.0000 mg | ORAL_TABLET | Freq: Every day | ORAL | Status: DC

## 2011-10-12 MED ORDER — CITALOPRAM HYDROBROMIDE 20 MG PO TABS: 20.0000 mg | ORAL_TABLET | Freq: Every day | ORAL | Status: DC

## 2011-10-12 MED ORDER — OMEPRAZOLE 20 MG PO CPDR: 20.0000 mg | DELAYED_RELEASE_CAPSULE | Freq: Every day | ORAL | Status: DC

## 2011-11-12 ENCOUNTER — Telehealth: Payer: Self-pay | Admitting: *Deleted

## 2011-11-12 DIAGNOSIS — E785 Hyperlipidemia, unspecified: Secondary | ICD-10-CM

## 2011-11-17 LAB — LIPID PANEL
HDL: 38 mg/dL — ABNORMAL LOW (ref >39)
LDL Cholesterol: 86 mg/dL (ref 0–99)
Total CHOL/HDL Ratio: 4.3 Ratio

## 2011-11-19 ENCOUNTER — Other Ambulatory Visit: Payer: Self-pay | Admitting: *Deleted

## 2011-11-19 ENCOUNTER — Other Ambulatory Visit: Payer: Self-pay | Admitting: Physician Assistant

## 2011-11-19 MED ORDER — CITALOPRAM HYDROBROMIDE 20 MG PO TABS: 20.0000 mg | ORAL_TABLET | Freq: Every day | ORAL | Status: DC

## 2011-11-19 MED ORDER — PRAVASTATIN SODIUM 40 MG PO TABS: 40.0000 mg | ORAL_TABLET | Freq: Every day | ORAL | Status: DC

## 2011-11-19 MED ORDER — OMEPRAZOLE 20 MG PO CPDR: 20.0000 mg | DELAYED_RELEASE_CAPSULE | Freq: Every day | ORAL | Status: DC

## 2011-12-31 ENCOUNTER — Ambulatory Visit (INDEPENDENT_AMBULATORY_CARE_PROVIDER_SITE_OTHER): Payer: BC Managed Care – PPO | Admitting: Family Medicine

## 2011-12-31 ENCOUNTER — Encounter: Payer: Self-pay | Admitting: Family Medicine

## 2011-12-31 VITALS — BP 129/80 | HR 74 | Ht 74.0 in | Wt 248.0 lb

## 2011-12-31 DIAGNOSIS — E785 Hyperlipidemia, unspecified: Secondary | ICD-10-CM

## 2011-12-31 DIAGNOSIS — Z008 Encounter for other general examination: Secondary | ICD-10-CM

## 2011-12-31 DIAGNOSIS — J449 Chronic obstructive pulmonary disease, unspecified: Secondary | ICD-10-CM

## 2011-12-31 MED ORDER — ALBUTEROL SULFATE (2.5 MG/3ML) 0.083% IN NEBU
2.5000 mg | INHALATION_SOLUTION | Freq: Once | RESPIRATORY_TRACT | Status: AC
  Administered 2011-12-31: 2.5 mg via RESPIRATORY_TRACT

## 2011-12-31 MED ORDER — TIOTROPIUM BROMIDE MONOHYDRATE 18 MCG IN CAPS: 18.0000 ug | ORAL_CAPSULE | Freq: Every day | RESPIRATORY_TRACT | Status: DC

## 2011-12-31 NOTE — Progress Notes (Signed)
  Subjective:    Patient ID: Christopher [name], male    DOB: Jan 22, 1951, 61 y.o.   MRN: [id number]  HPI COPD -Occ uses albuterol.  Last spirometry was one year ago.  Says does need to exercise some more. Hard to start to do it regualry. Got short of breath when walks up a hill.  Quit smoking 10 years ago.  Then quit smokless tob 2 years.    Review of Systems     Objective:   Physical Exam  Constitutional: He appears well-developed and well-nourished.  Psychiatric: He has a normal mood and affect. His behavior is normal.          Assessment & Plan:  COPD - REviewed results. His FVC was 73%, FEV1 54%, ratio of 60. His ratio did not change much but his FVC and FEV1 decreased from last year, August of 2012. He moved from the mild category tube moderate. We discussed starting a medication such as Spiriva. I gave him a sample and showed him how to use the device. Prescription for 90 day sent to the pharmacy. I would like to see him back in 6 months to make sure that he is doing well. He has any problems with the medication then please call sooner. He is not a smoker and quit almost 10 years ago.Also encouraged him to work on daily exercise. He did start with a walking program. Even starting with 5 minutes may be adequate in and eventually buildup. I do think that some of the change may be deconditioning in addition to worsening of his COPD. Consider retesting for spirometry in 6-12 months. He continues to decline then consider further evaluation with referral to pulmonology and possible CT of the chest. Continue to use prior as needed.  Hyperlipidemia - Will recheck in 6 months to make sure TG are low.

## 2012-01-04 ENCOUNTER — Ambulatory Visit: Payer: BC Managed Care – PPO | Admitting: Family Medicine

## 2012-05-26 ENCOUNTER — Other Ambulatory Visit: Payer: Self-pay | Admitting: Family Medicine

## 2012-06-15 IMAGING — CR DG CHEST 2V
2 series · 2 of 2 positions shown · non-contrast
Comparison: None.

CLINICAL DATA: Short of breath, back pain, former smoking history

CHEST - 2 VIEW

[view not recorded (1 of 2)]
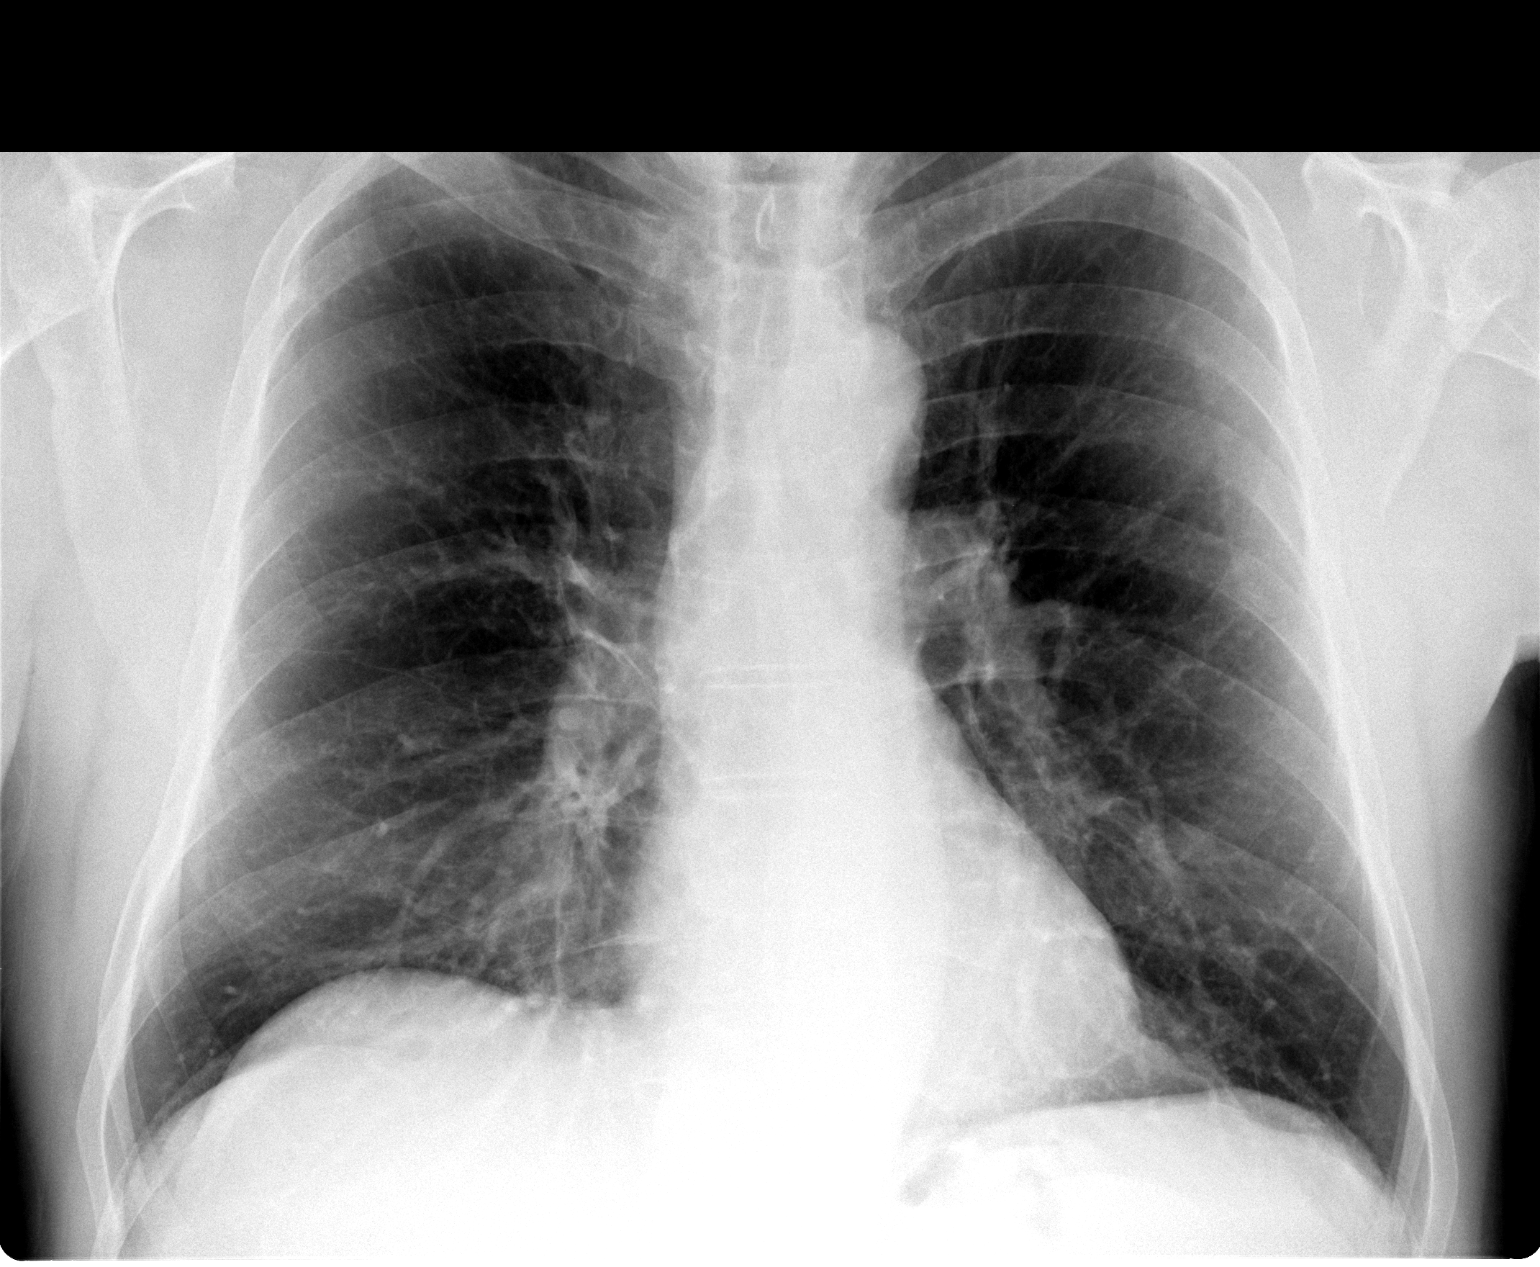

[view not recorded (2 of 2)]
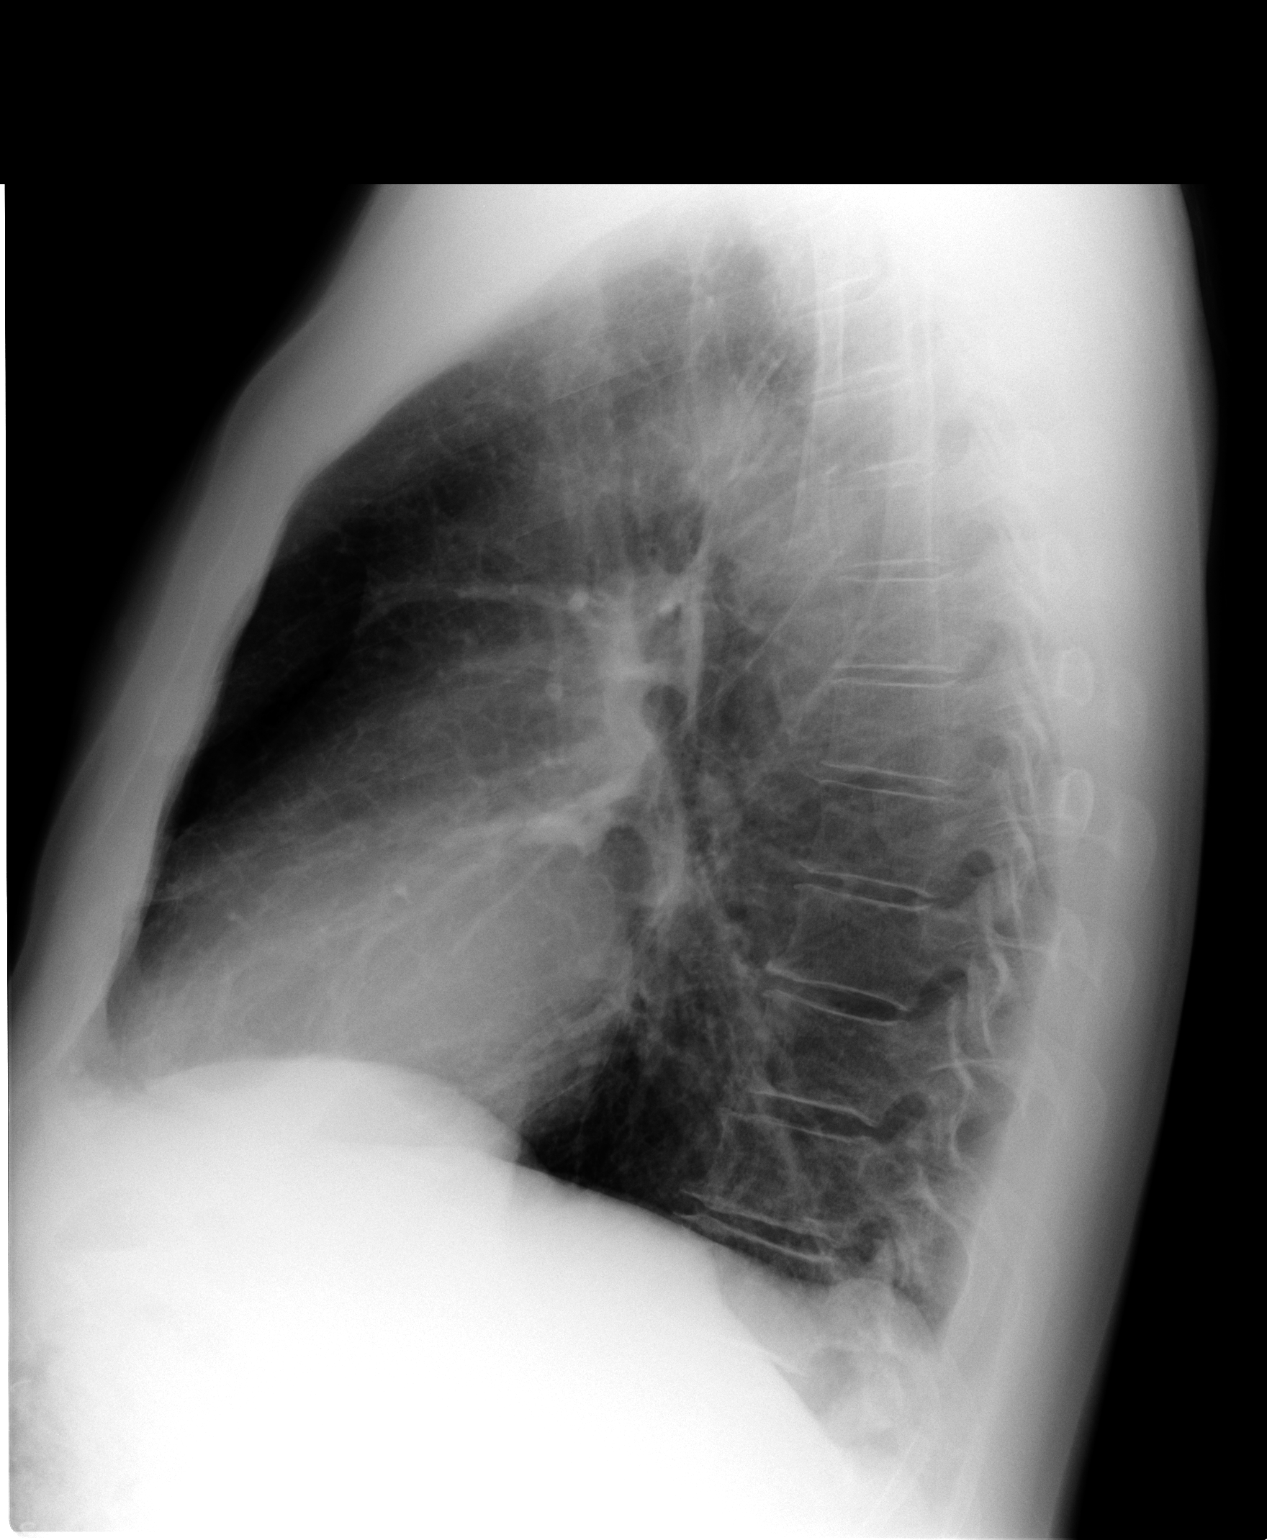

[2 of 2 positions shown; findings below may reference images not displayed]

FINDINGS: The lungs are clear but hyperaerated consistent with
COPD.  Mediastinal contours appear normal.  The heart is within
normal limits in size.  No bony abnormality is seen.
IMPRESSION: Hyperaeration consistent with COPD.  No active lung disease.

## 2012-06-25 IMAGING — CR DG LUMBAR SPINE 2-3V
3 series · 3 of 3 positions shown · non-contrast
Comparison: None.

CLINICAL DATA: Low back pain.

LUMBAR SPINE - 2-3 VIEW

[view not recorded (1 of 3)]
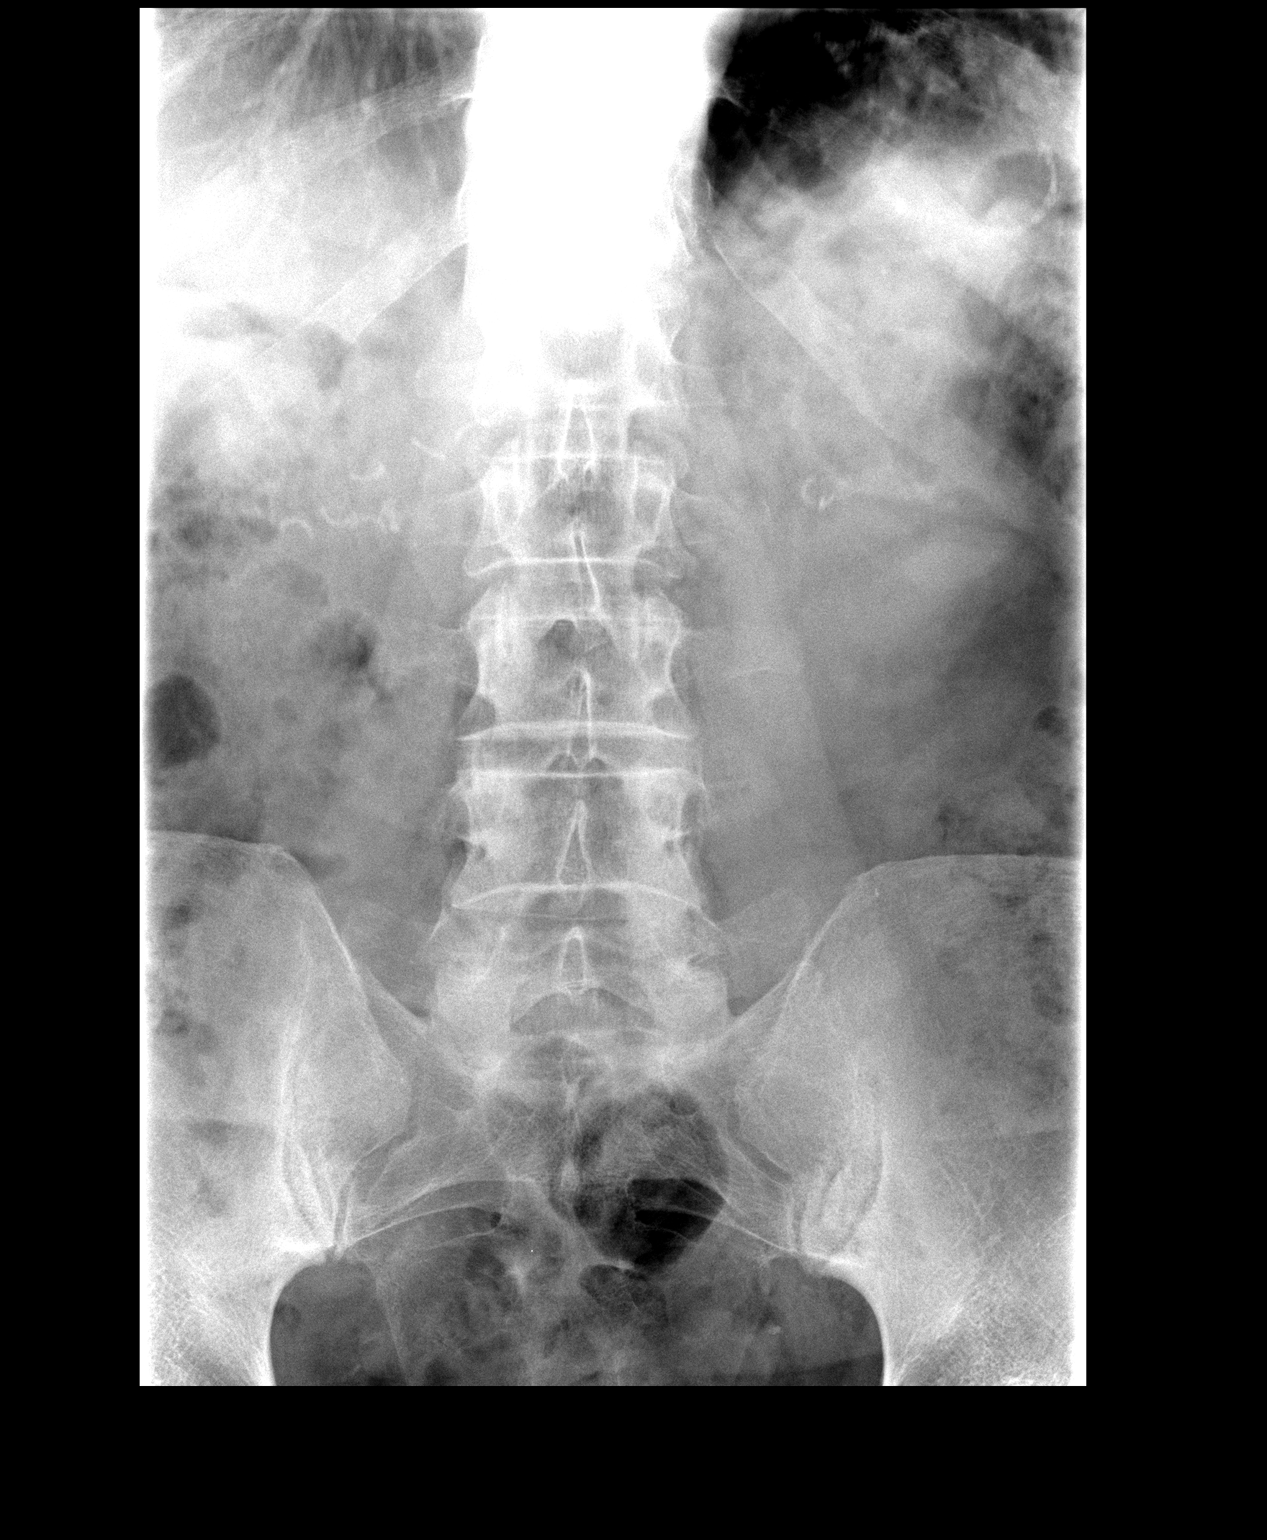

[view not recorded (2 of 3)]
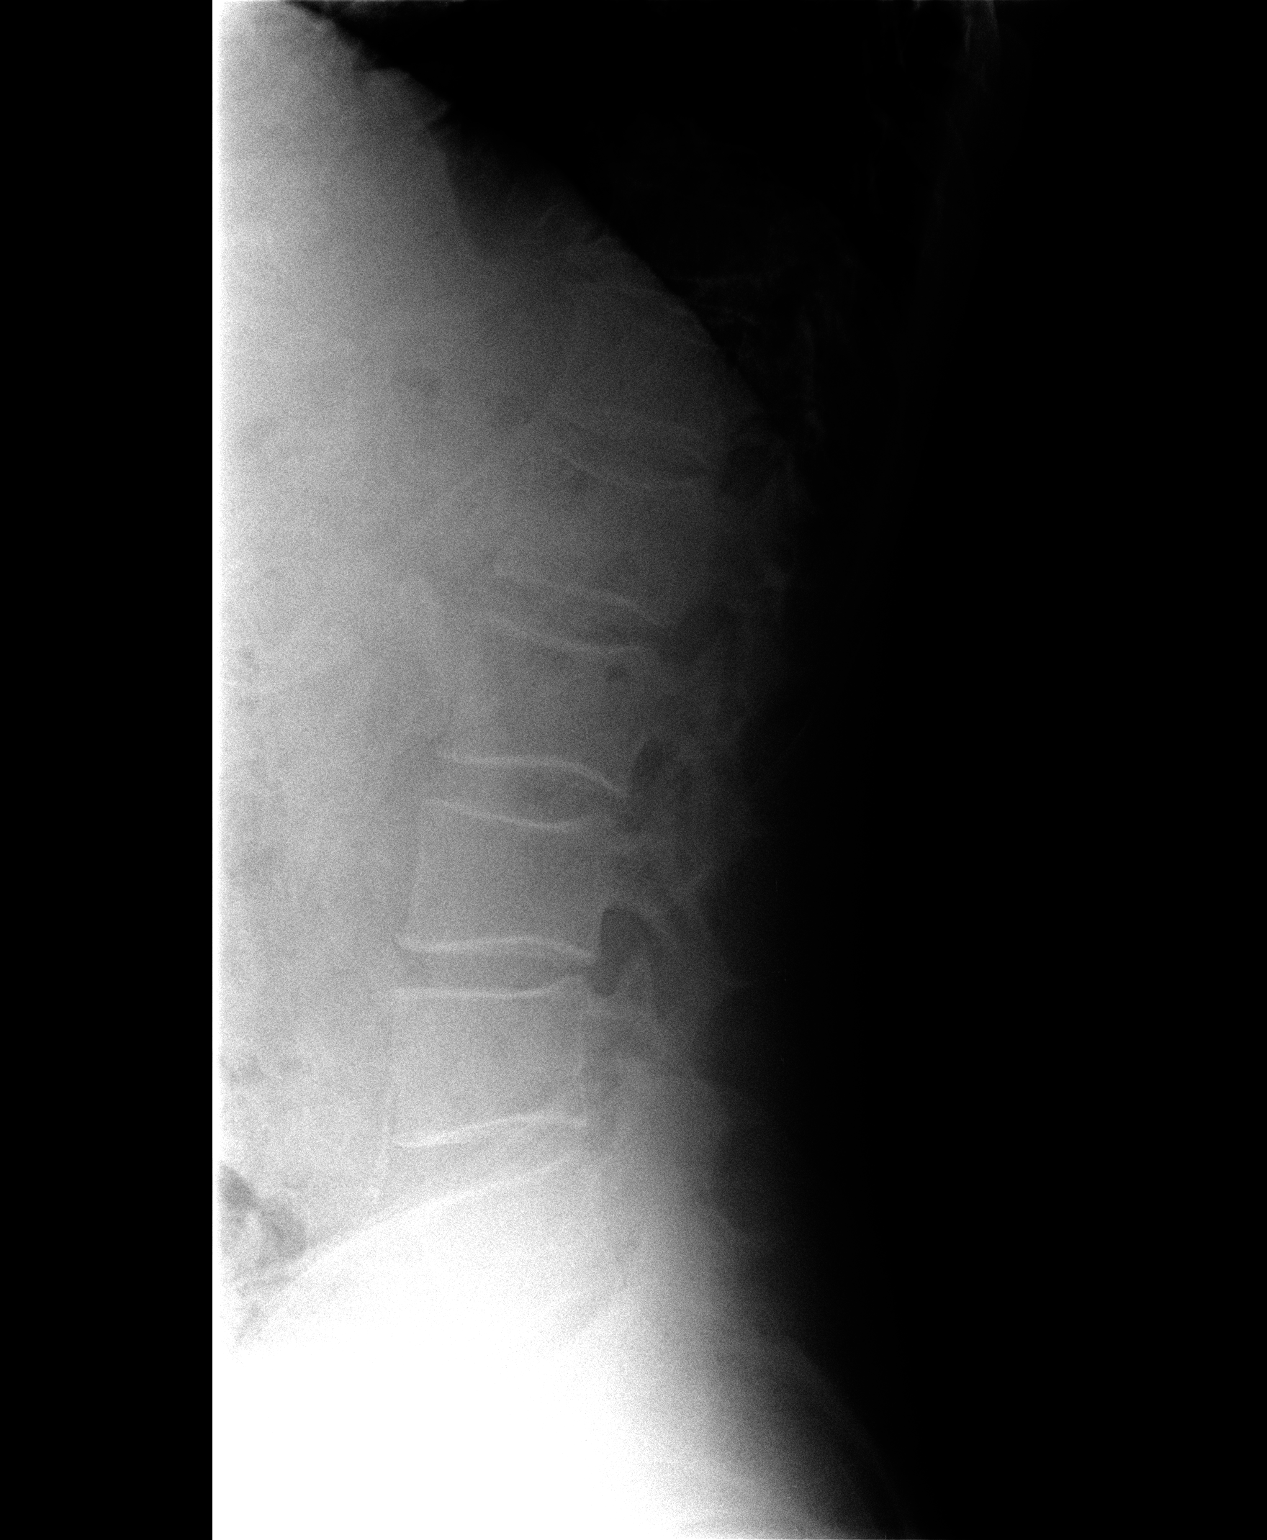

[view not recorded (3 of 3)]
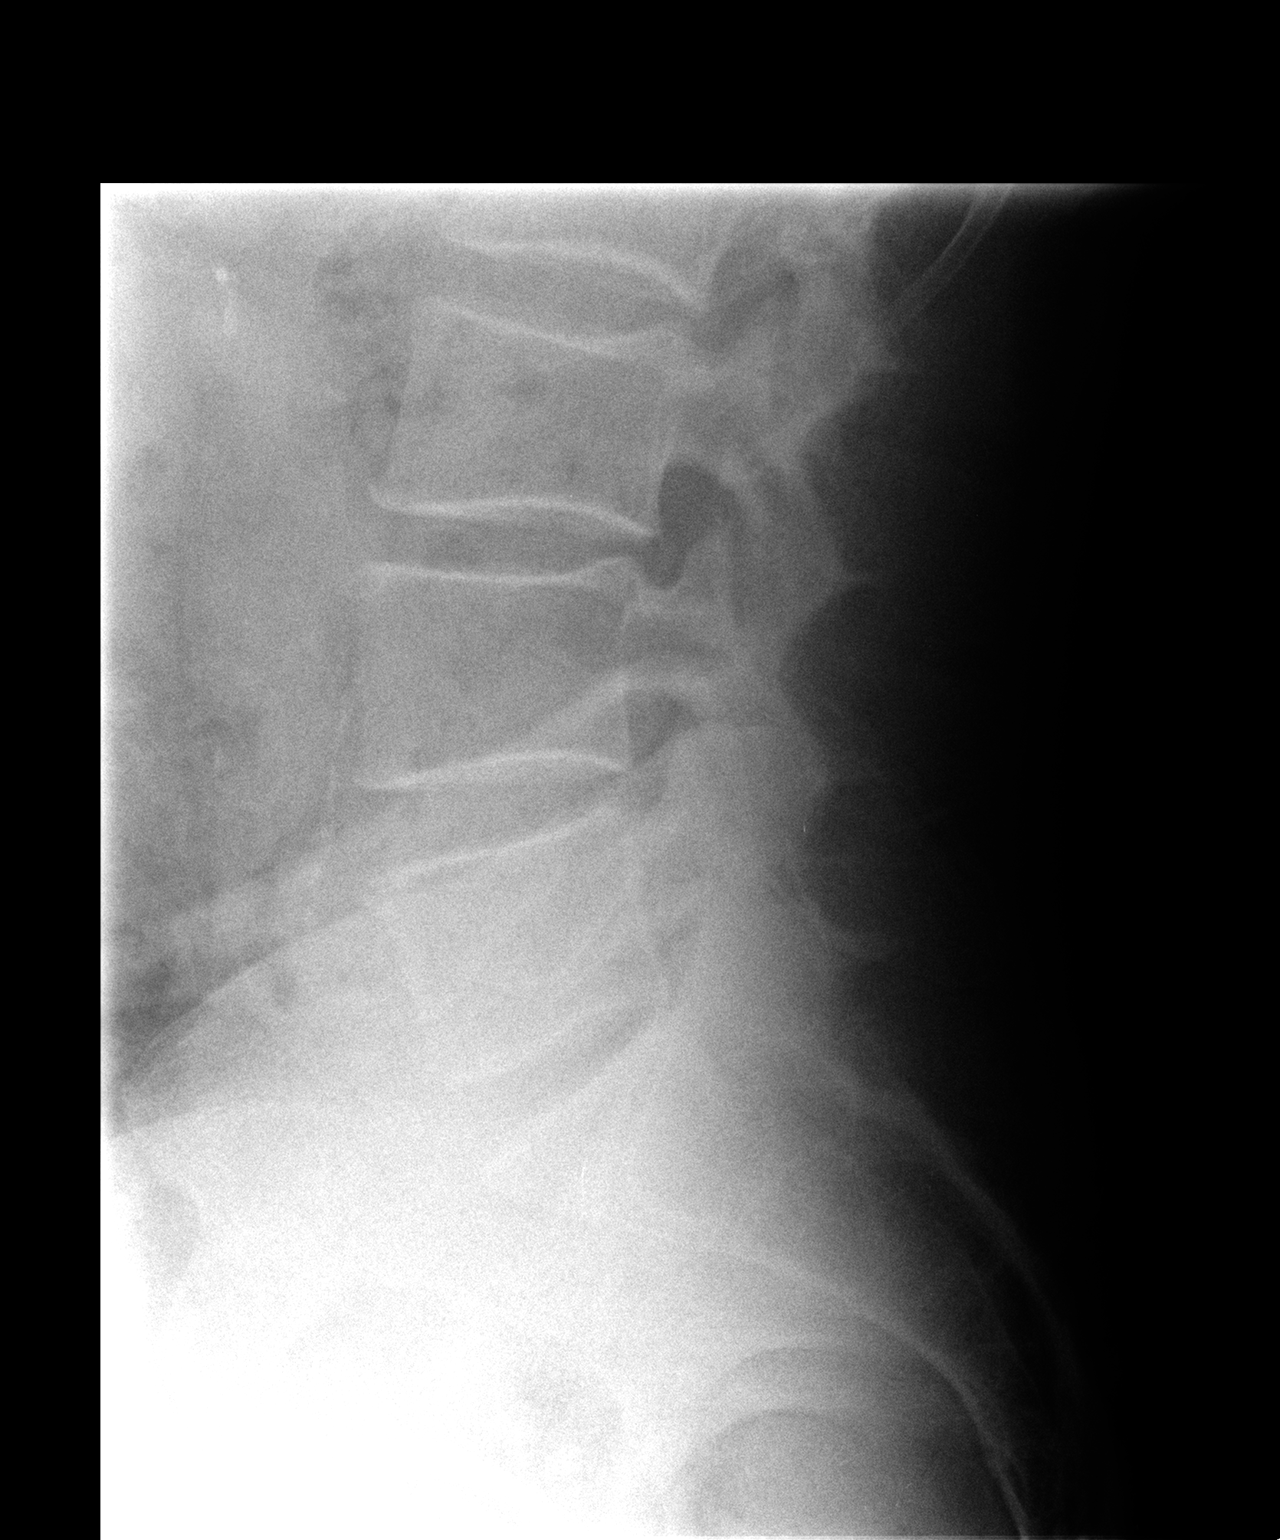

[3 of 3 positions shown; findings below may reference images not displayed]

FINDINGS: Lumbar vertebral bodies show normal alignment without
fracture or subluxation.  There is at least mild disc space
narrowing at L5-S1.  No visible bony lesions.
IMPRESSION: Lumbar spondylosis with disc space narrowing at L5-S1.

## 2012-07-04 ENCOUNTER — Ambulatory Visit (INDEPENDENT_AMBULATORY_CARE_PROVIDER_SITE_OTHER): Payer: BC Managed Care – PPO | Admitting: Family Medicine

## 2012-07-04 ENCOUNTER — Encounter: Payer: Self-pay | Admitting: Family Medicine

## 2012-07-04 VITALS — BP 136/89 | HR 72 | Ht 74.0 in | Wt 243.0 lb

## 2012-07-04 DIAGNOSIS — E785 Hyperlipidemia, unspecified: Secondary | ICD-10-CM

## 2012-07-04 DIAGNOSIS — J449 Chronic obstructive pulmonary disease, unspecified: Secondary | ICD-10-CM

## 2012-07-04 DIAGNOSIS — IMO0001 Reserved for inherently not codable concepts without codable children: Secondary | ICD-10-CM

## 2012-07-04 NOTE — Progress Notes (Signed)
  Subjective:    Patient ID: Christopher [name], male    DOB: [birth year], 62 y.o.   MRN: [id number]  HPI Hyperlpidemia - Feels the pravastatin is causing some muscle aches. Worse with acitivity.   COPD - Still gets tired walking up hill but says has been that way since he was sick.  Using spiriva daily.  No recentl flares.  He has been trying to be more acitive. Says he is eating better too.    Had naother pancreatitis flar in November.  Much improved. Followed by GI.  Review of Systems     Objective:   Physical Exam  Constitutional: He is oriented to person, place, and time. He appears well-developed and well-nourished.  HENT:  Head: Normocephalic and atraumatic.  Cardiovascular: Normal rate, regular rhythm and normal heart sounds.   Pulmonary/Chest: Effort normal and breath sounds normal.  Neurological: He is alert and oriented to person, place, and time.  Skin: Skin is warm and dry.  Psychiatric: He has a normal mood and affect. His behavior is normal.          Assessment & Plan:  Hyperlipidemia - will check CK today since having aches on pravastatin.  Will stop the medication. Recheck lipids in 1 months. Due for CMP as well.   COPD - Well controlled.  F/U in 6 months.    Pancreatitis - resolved.  Following with GI.

## 2012-07-09 ENCOUNTER — Other Ambulatory Visit: Payer: Self-pay | Admitting: Family Medicine

## 2012-08-01 ENCOUNTER — Other Ambulatory Visit: Payer: Self-pay | Admitting: Family Medicine

## 2012-08-01 DIAGNOSIS — R748 Abnormal levels of other serum enzymes: Secondary | ICD-10-CM

## 2012-08-01 LAB — LIPID PANEL
Cholesterol: 162 mg/dL (ref 0–200)
HDL: 39 mg/dL — ABNORMAL LOW (ref >39)
Total CHOL/HDL Ratio: 4.2 Ratio
Triglycerides: 138 mg/dL (ref <150)

## 2012-08-01 LAB — COMPLETE METABOLIC PANEL WITH GFR
Alkaline Phosphatase: 150 U/L — ABNORMAL HIGH (ref 39–117)
BUN: 24 mg/dL — ABNORMAL HIGH (ref 6–23)
Creat: 1.19 mg/dL (ref 0.50–1.35)
GFR, Est Non African American: 65 mL/min
Glucose, Bld: 93 mg/dL (ref 70–99)
Sodium: 140 mEq/L (ref 135–145)
Total Bilirubin: 0.5 mg/dL (ref 0.3–1.2)

## 2012-09-13 ENCOUNTER — Telehealth: Payer: Self-pay | Admitting: Family Medicine

## 2012-09-13 NOTE — Telephone Encounter (Signed)
Dr. Skip Mayer daughter of Christopher [name] called and is advising that her father went on a road trip and think he may be experiencing panceratic attacks. Pt and his youngest daughter Christopher [name] are going to [facility] in [place] right now and will keep you updated when they no more information. Pt's daughter Christopher [name] just concerned since father use to have really bad problems back in 2011 with the same issues. If you have any problems/questions Christopher [name] stated please call her [phone]. Thanks

## 2012-09-28 ENCOUNTER — Ambulatory Visit: Payer: BC Managed Care – PPO | Admitting: Family Medicine

## 2012-09-30 ENCOUNTER — Encounter: Payer: Self-pay | Admitting: Family Medicine

## 2012-09-30 ENCOUNTER — Ambulatory Visit (INDEPENDENT_AMBULATORY_CARE_PROVIDER_SITE_OTHER): Payer: BC Managed Care – PPO | Admitting: Family Medicine

## 2012-09-30 VITALS — BP 110/66 | HR 84 | Ht 74.0 in | Wt 218.0 lb

## 2012-09-30 DIAGNOSIS — K219 Gastro-esophageal reflux disease without esophagitis: Secondary | ICD-10-CM

## 2012-09-30 DIAGNOSIS — F5000 Anorexia nervosa, unspecified: Secondary | ICD-10-CM

## 2012-09-30 DIAGNOSIS — K859 Acute pancreatitis without necrosis or infection, unspecified: Secondary | ICD-10-CM

## 2012-09-30 DIAGNOSIS — R630 Anorexia: Secondary | ICD-10-CM

## 2012-09-30 DIAGNOSIS — R110 Nausea: Secondary | ICD-10-CM

## 2012-09-30 LAB — CBC
HCT: 36.9 % — ABNORMAL LOW (ref 39.0–52.0)
Hemoglobin: 12.7 g/dL — ABNORMAL LOW (ref 13.0–17.0)
MCH: 31.6 pg (ref 26.0–34.0)
MCHC: 34.4 g/dL (ref 30.0–36.0)
RBC: 4.02 MIL/uL — ABNORMAL LOW (ref 4.22–5.81)

## 2012-09-30 NOTE — Patient Instructions (Addendum)
Increase your omeprazole to twice a day.  Call if not continuing to improve

## 2012-09-30 NOTE — Progress Notes (Signed)
  Subjective:    Patient ID: Christopher [name], male    DOB: 04-15-1951, 62 y.o.   MRN: [id number]  HPI He recently took a strip to [place]. He was hospitalized for 2 days there for acute pancreatitis. Says his diet wasn't great while traveling.  He does have a history of recurrent pancreatitis, COPD. After returning to the local area he went to [facility] on 09/28/2012 complaining of nausea and anorexia. He actually lost about 20 pounds over the last 2 weeks. He was not having any significant abdominal pain or abdominal tenderness at that time. Per the notes reviewed from [facility] it looks like they gave him some IV fluids. They did do a CBC. White count was 13.1 at that time sodium was low borderline low at 134. Alkaline phosphatase was slightly elevated at 139. They did not admit him but gave him prescription for Zofran and felt that he was stable for discharge home and followup with primary care provider.  His weight was 217 pounds on May 7 at [facility].  Zofran is helping. He feelslike he is cause up on his fliuds. He has been eating a bland diet.    He had MRI of his pancreas about a month ago and everything was stable. He has a pancreatic cysts.  Review of Systems     Objective:   Physical Exam  Constitutional: He is oriented to person, place, and time. He appears well-developed and well-nourished.  HENT:  Head: Normocephalic and atraumatic.  Cardiovascular: Normal rate, regular rhythm and normal heart sounds.   Pulmonary/Chest: Effort normal and breath sounds normal.  Abdominal: Soft. Bowel sounds are normal. He exhibits no distension and no mass. There is tenderness. There is no rebound and no guarding.  MIld tenderness on the LUQ  Neurological: He is alert and oriented to person, place, and time.  Skin: Skin is warm and dry.  Psychiatric: He has a normal mood and affect. His behavior is normal.          Assessment & Plan:  Recent episode of acute  pancreatitis- He is eating more bland diet. Was given some hdyrocodone.  Says he is better.  No abdominal pain but has a "rawness in the epigatric area" .  Zofran is heping.  Took stool softerner yesterday and helped. I will also recheck his lipase. He was [id number] in [place]. Come down to 99 at [facility]. Like to recheck today to make sure it's continuing to go down. We will also repeat his white blood cell count which was around 13 at [facility] and recheck his sodium which was a little bit lower 134.  GERD - will inc PPI to BID for 2 weeks. Continue bland diet.  If dong well then can go back down to once a day.   Anorexia- work on diet and hydration.  Follow weight.

## 2012-10-01 LAB — BASIC METABOLIC PANEL WITH GFR
BUN: 17 mg/dL (ref 6–23)
CO2: 24 mEq/L (ref 19–32)
Calcium: 9.3 mg/dL (ref 8.4–10.5)
GFR, Est African American: 55 mL/min — ABNORMAL LOW
Glucose, Bld: 77 mg/dL (ref 70–99)
Potassium: 5.2 mEq/L (ref 3.5–5.3)

## 2012-10-13 ENCOUNTER — Other Ambulatory Visit: Payer: Self-pay | Admitting: Family Medicine

## 2012-10-13 ENCOUNTER — Other Ambulatory Visit: Payer: Self-pay | Admitting: *Deleted

## 2012-10-13 DIAGNOSIS — D509 Iron deficiency anemia, unspecified: Secondary | ICD-10-CM

## 2012-10-13 DIAGNOSIS — K859 Acute pancreatitis without necrosis or infection, unspecified: Secondary | ICD-10-CM

## 2012-10-13 LAB — COMPREHENSIVE METABOLIC PANEL
Alkaline Phosphatase: 139 U/L — ABNORMAL HIGH (ref 39–117)
CO2: 25 mEq/L (ref 19–32)
Creat: 1.32 mg/dL (ref 0.50–1.35)
Glucose, Bld: 93 mg/dL (ref 70–99)
Total Bilirubin: 0.5 mg/dL (ref 0.3–1.2)

## 2012-10-13 LAB — CBC
Hemoglobin: 12.9 g/dL — ABNORMAL LOW (ref 13.0–17.0)
MCH: 31.1 pg (ref 26.0–34.0)
MCHC: 34.3 g/dL (ref 30.0–36.0)
MCV: 90.6 fL (ref 78.0–100.0)
Platelets: 443 10*3/uL — ABNORMAL HIGH (ref 150–400)
RBC: 4.15 MIL/uL — ABNORMAL LOW (ref 4.22–5.81)

## 2012-10-14 ENCOUNTER — Ambulatory Visit (INDEPENDENT_AMBULATORY_CARE_PROVIDER_SITE_OTHER): Payer: BC Managed Care – PPO | Admitting: Family Medicine

## 2012-10-14 ENCOUNTER — Encounter: Payer: Self-pay | Admitting: Family Medicine

## 2012-10-14 VITALS — BP 112/73 | HR 102 | Temp 98.4°F | Wt 213.0 lb

## 2012-10-14 DIAGNOSIS — R634 Abnormal weight loss: Secondary | ICD-10-CM

## 2012-10-14 DIAGNOSIS — K859 Acute pancreatitis without necrosis or infection, unspecified: Secondary | ICD-10-CM

## 2012-10-14 DIAGNOSIS — R509 Fever, unspecified: Secondary | ICD-10-CM

## 2012-10-14 DIAGNOSIS — K2970 Gastritis, unspecified, without bleeding: Secondary | ICD-10-CM

## 2012-10-14 DIAGNOSIS — K2990 Gastroduodenitis, unspecified, without bleeding: Secondary | ICD-10-CM

## 2012-10-14 LAB — AMYLASE: Amylase: 37 U/L (ref 0–105)

## 2012-10-14 LAB — LIPASE: Lipase: 33 U/L (ref 0–75)

## 2012-10-14 NOTE — Progress Notes (Signed)
Subjective:    Patient ID: Christopher [name], male    DOB: 1950-10-09, 62 y.o.   MRN: [id number]  HPI Had recent attack of pancreatitis. Still feels lightheaded and weak.  He foes feel like his is making some progress daily in how he feels, just slow to recover.  No diarrhea.  Says weight is down. Has been trying to eat.  Tried inc prilosec to 40mg  but says it caused in GI upset so went back down to 20mg   Has been nauseated intermittantly. Has been belching a lot this last month, though has actually been better the last few days. No change in stools.  Has daily BM.Marland Kitchen C/o  Soreness in the epigastrum.  Says feels sore when takes a deep breath or when stretches.  Some mild LLQ pain as well.These are his typical locals of pain when his pancreas flares.  Feels really tired.  Says felt worse on 2 of the prilosec.  Temp running 99-100 over the last week.  No chills.  Feel warm at times.  No muscle aches.  No cold or sinus symptoms. No urinary symptoms.  Went for labs yesterday.    Review of Systems BP 112/73  Pulse 102  Temp(Src) 98.4 F (36.9 C) (Oral)  Wt 213 lb (96.616 kg)  BMI 27.34 kg/m2    Allergies  Allergen Reactions  . Pravastatin Other (See Comments)    myalgias    Past Medical History  Diagnosis Date  . Gastric ulcer   . Pancreatitis     11/2010. 2011    Past Surgical History  Procedure Laterality Date  . Cholecystectomy    . Abdominal reconstructin      X 3  . Roux-en-y gastric bypass      History   Social History  . Marital Status: Divorced    Spouse Name: N/A    Number of Children: N/A  . Years of Education: N/A   Occupational History  . Not on file.   Social History Main Topics  . Smoking status: Former Games developer  . Smokeless tobacco: Not on file  . Alcohol Use: No  . Drug Use: No  . Sexually Active: Not on file   Other Topics Concern  . Not on file   Social History Narrative  . No narrative on file    Family History  Problem Relation Age of Onset  .  Hypertension Mother   . Cancer Father     prostate, lip cancer  . Heart attack Father   . Cancer Sister   . Cancer Brother     Outpatient Encounter Prescriptions as of 10/14/2012  Medication Sig Dispense Refill  . citalopram (CELEXA) 20 MG tablet TAKE 1 TABLET BY MOUTH EVERY DAY  90 tablet  1  . omeprazole (PRILOSEC) 20 MG capsule TAKE ONE CAPSULE BY MOUTH EVERY DAY  90 capsule  1  . SPIRIVA HANDIHALER 18 MCG inhalation capsule PLACE 1 CAPSULE (18 MCG TOTAL) INTO INHALER AND INHALE DAILY.  90 capsule  1  . albuterol (PROAIR HFA) 108 (90 BASE) MCG/ACT inhaler Inhale 2 puffs into the lungs every 6 (six) hours as needed for wheezing.  1 Inhaler  0  . [DISCONTINUED] pravastatin (PRAVACHOL) 40 MG tablet TAKE 1 TABLET BY MOUTH AT BEDTIME  90 tablet  1   No facility-administered encounter medications on file as of 10/14/2012.          Objective:   Physical Exam  Constitutional: He is oriented to person, place, and time. He appears  well-developed and well-nourished.  HENT:  Head: Normocephalic and atraumatic.  Cardiovascular: Normal rate, regular rhythm and normal heart sounds.   Pulmonary/Chest: Effort normal and breath sounds normal.  Abdominal: Soft. Bowel sounds are normal. He exhibits no distension and no mass. There is tenderness. There is no rebound and no guarding.  Mildly tender at the epigastrum LUQ.    Neurological: He is alert and oriented to person, place, and time.  Skin: Skin is warm and dry.  Psychiatric: He has a normal mood and affect. His behavior is normal.          Assessment & Plan:  Acute pancreatitis - did go for labs yesterday which were reassuring. Alk phos was elevated so will recheck in 1-2 months.  Continue bland diet.  We will call the lab since he had his blood drawn yesterday and see if they can add an amylase and lipase level. We didn't repeat these since had come back into the normal range a week ago.  Also check thyroid. White blood cells were back  down to normal. And platelets were trending back down which is fantastic.  Gastritis-I. do suspect that he probably has some gastritis. He was unable to tolerate increasing his Prilosec because of increased GI discomfort so we'll try putting him on Dexilant, want him to try over the weekend to see if this improves the epigastric discomfort as well as the belching and gas.   Fevers - As far as the fevers I am concerned and I want him to keep a log and keep track of these.WBC was normal which is reassuring.    Weight loss-I. really think this is secondary to dietary changes after his acute pancreatitis attack. We'll definitely continue to monitor this.If gets nausea then switch back to liquid diet and if not improving then let me know.

## 2012-10-14 NOTE — Patient Instructions (Addendum)
Keep a temperature  Log and call me on Tuesday Try the dexilant instead of Prilosec.

## 2012-11-17 ENCOUNTER — Other Ambulatory Visit: Payer: Self-pay | Admitting: Family Medicine

## 2013-01-02 ENCOUNTER — Ambulatory Visit: Payer: BC Managed Care – PPO | Admitting: Family Medicine

## 2013-01-04 ENCOUNTER — Ambulatory Visit (INDEPENDENT_AMBULATORY_CARE_PROVIDER_SITE_OTHER): Payer: BC Managed Care – PPO | Admitting: Family Medicine

## 2013-01-04 ENCOUNTER — Encounter: Payer: Self-pay | Admitting: Family Medicine

## 2013-01-04 VITALS — BP 107/67 | HR 76 | Ht 74.0 in | Wt 218.0 lb

## 2013-01-04 DIAGNOSIS — F3289 Other specified depressive episodes: Secondary | ICD-10-CM

## 2013-01-04 DIAGNOSIS — F32A Depression, unspecified: Secondary | ICD-10-CM | POA: Insufficient documentation

## 2013-01-04 DIAGNOSIS — J449 Chronic obstructive pulmonary disease, unspecified: Secondary | ICD-10-CM

## 2013-01-04 DIAGNOSIS — R748 Abnormal levels of other serum enzymes: Secondary | ICD-10-CM

## 2013-01-04 DIAGNOSIS — F329 Major depressive disorder, single episode, unspecified: Secondary | ICD-10-CM

## 2013-01-04 LAB — ALKALINE PHOSPHATASE: Alkaline Phosphatase: 129 U/L — ABNORMAL HIGH (ref 39–117)

## 2013-01-04 MED ORDER — ALBUTEROL SULFATE (2.5 MG/3ML) 0.083% IN NEBU
2.5000 mg | INHALATION_SOLUTION | Freq: Once | RESPIRATORY_TRACT | Status: AC
  Administered 2013-01-04: 2.5 mg via RESPIRATORY_TRACT

## 2013-01-04 MED ORDER — ALBUTEROL SULFATE HFA 108 (90 BASE) MCG/ACT IN AERS: 2.0000 | INHALATION_SPRAY | Freq: Four times a day (QID) | RESPIRATORY_TRACT | Status: DC | PRN

## 2013-01-04 NOTE — Progress Notes (Signed)
Subjective:    Patient ID: Christopher [name], male    DOB: 12-29-1950, 62 y.o.   MRN: [id number]  HPI COPD-on Spiriva and when necessary albuterol. Last spirometry was 2 years.  Says overall doing well. Occ feels SOB when it is really hot outside.  Forgot dose today.  Rarely uses his albuterol.   Recurrent pancreatitis-due to recheck alkaline phosphatase today. He has not any abdominal pain or discomfort or nausea or other GI symptoms. He's hopeful. There he says typically [place] attack about twice a year so he's worried about getting sick this fall.  Depression-overall he is doing well on the citalopram. He thinks it's very helpful. He has very few days where he feels sad or down. He denies feeling anxious. He would like to continue his current regimen. He says in fact he actually talk to his mother into getting treatment taking the same medication and has made a big difference in her life. She was severely depressed after her husband passed away. Review of Systems  BP 107/67  Pulse 76  Ht 6\' 2"  (1.88 m)  Wt 218 lb (98.884 kg)  BMI 27.98 kg/m2  SpO2 97%    Allergies  Allergen Reactions  . Pravastatin Other (See Comments)    myalgias    Past Medical History  Diagnosis Date  . Gastric ulcer   . Pancreatitis     11/2010. 2011    Past Surgical History  Procedure Laterality Date  . Cholecystectomy    . Abdominal reconstructin      X 3  . Roux-en-y gastric bypass      History   Social History  . Marital Status: Divorced    Spouse Name: N/A    Number of Children: N/A  . Years of Education: N/A   Occupational History  . Not on file.   Social History Main Topics  . Smoking status: Former Games developer  . Smokeless tobacco: Not on file  . Alcohol Use: No  . Drug Use: No  . Sexual Activity: Not on file   Other Topics Concern  . Not on file   Social History Narrative  . No narrative on file    Family History  Problem Relation Age of Onset  . Hypertension Mother   . Cancer  Father     prostate, lip cancer  . Heart attack Father   . Cancer Sister   . Cancer Brother     Outpatient Encounter Prescriptions as of 01/04/2013  Medication Sig Dispense Refill  . citalopram (CELEXA) 20 MG tablet TAKE 1 TABLET BY MOUTH EVERY DAY  90 tablet  1  . omeprazole (PRILOSEC) 20 MG capsule TAKE ONE CAPSULE BY MOUTH EVERY DAY  90 capsule  1  . SPIRIVA HANDIHALER 18 MCG inhalation capsule PLACE 1 CAPSULE (18 MCG TOTAL) INTO INHALER AND INHALE DAILY.  90 capsule  1  . albuterol (PROAIR HFA) 108 (90 BASE) MCG/ACT inhaler Inhale 2 puffs into the lungs every 6 (six) hours as needed for wheezing.  1 Inhaler  0  . [DISCONTINUED] albuterol (PROAIR HFA) 108 (90 BASE) MCG/ACT inhaler Inhale 2 puffs into the lungs every 6 (six) hours as needed for wheezing.  1 Inhaler  0  . albuterol (PROVENTIL) (2.5 MG/3ML) 0.083% nebulizer solution 2.5 mg        No facility-administered encounter medications on file as of 01/04/2013.          Objective:   Physical Exam  Constitutional: He is oriented to person, place, and time.  He appears well-developed and well-nourished.  HENT:  Head: Normocephalic and atraumatic.  Right Ear: External ear normal.  Left Ear: External ear normal.  Nose: Nose normal.  Mouth/Throat: Oropharynx is clear and moist.  TMs and canals are clear.   Eyes: Conjunctivae and EOM are normal. Pupils are equal, round, and reactive to light.  Neck: Neck supple. No thyromegaly present.  Cardiovascular: Normal rate and normal heart sounds.   Pulmonary/Chest: Effort normal and breath sounds normal.  Lymphadenopathy:    He has no cervical adenopathy.  Neurological: He is alert and oriented to person, place, and time.  Skin: Skin is warm and dry.  Psychiatric: He has a normal mood and affect.          Assessment & Plan:  COPD-Stable. Moderate.  Due for spirometry, has been 2 years.  01/04/2013 shows FVC 91%, FEV1 59%, ratio of 52%.  Moderate obstruction.   Bc of change over  2 year period recommend repeat in 6 months. Note, after reviewing his records more closely it looks like we did actually perform spirometry a year ago. The results were noted under the problem list that I did not see the actual scan to document. Thus, from last year to this year there actually has not been a significant change in his spirometry. He has been on Spiriva over the last year. We could certainly repeat the spirometry in 6 months or push it out to 12 months. Overall he is doing well.  Recurrent pancreatitis - no recent flares. Took 2 months to get over last attack.  Lab Results  Component Value Date   ALKPHOS 139* 10/13/2012    Depression-doing fantastic on citalopram. Continue current regimen for now.

## 2013-01-05 ENCOUNTER — Encounter: Payer: Self-pay | Admitting: *Deleted

## 2013-01-15 ENCOUNTER — Other Ambulatory Visit: Payer: Self-pay | Admitting: Family Medicine

## 2013-02-13 ENCOUNTER — Ambulatory Visit (INDEPENDENT_AMBULATORY_CARE_PROVIDER_SITE_OTHER): Payer: BC Managed Care – PPO | Admitting: Family Medicine

## 2013-02-13 ENCOUNTER — Encounter: Payer: Self-pay | Admitting: Family Medicine

## 2013-02-13 VITALS — BP 146/81 | HR 89 | Temp 97.4°F | Wt 217.0 lb

## 2013-02-13 DIAGNOSIS — L03211 Cellulitis of face: Secondary | ICD-10-CM

## 2013-02-13 DIAGNOSIS — R229 Localized swelling, mass and lump, unspecified: Secondary | ICD-10-CM

## 2013-02-13 MED ORDER — MUPIROCIN 2 % EX OINT: TOPICAL_OINTMENT | Freq: Two times a day (BID) | CUTANEOUS | Status: DC

## 2013-02-13 MED ORDER — SULFAMETHOXAZOLE-TMP DS 800-160 MG PO TABS: 1.0000 | ORAL_TABLET | Freq: Two times a day (BID) | ORAL | Status: DC

## 2013-02-13 MED ORDER — HYDROCODONE-ACETAMINOPHEN 5-325 MG PO TABS: 1.0000 | ORAL_TABLET | ORAL | Status: AC | PRN

## 2013-02-13 NOTE — Progress Notes (Signed)
  Subjective:    Patient ID: Christopher [name], male    DOB: [birth year], 62 y.o.   MRN: [id number]  HPI Few a few days had a pimple inside his right nose that was really tender. Then last night noticed started swelling aroundt the right eye.  No fever, or chills or sweats. Today is really sore and tender. Had to take a hydrocodone last night and helpedhim sleep bc so painful.  Can't take NSAIDs.    Review of Systems     Objective:   Physical Exam  Constitutional: He appears well-developed and well-nourished.  HENT:  Head: Normocephalic and atraumatic.    Right Ear: External ear normal.  Left Ear: External ear normal.  Nose: Nose normal.  Mouth/Throat: Oropharynx is clear and moist.  +denture. bilat ear canals are normal. Yellow d=cust on the septum and the outer nares on the right.   Eyes: Conjunctivae are normal. Pupils are equal, round, and reactive to light.  Skin: Skin is warm and dry.  Psychiatric: He has a normal mood and affect. His behavior is normal. Judgment and thought content normal.          Assessment & Plan:  Am very concerned for facial cellulitis. I think that the original pimple that he had maybe spreading into his face because of the swelling and erythema. I'm going to place him on Bactrim to cover for MRSA and have him start using mupirocin ointment inside of the nostril. It is not responding within the next 48 hours on antibiotics I want him to call the office and let me know. Certainly if the redness or swelling is expanding her starting to affect his eye then please let me know. Did give him a small quantity of hydrocodone to use as needed since he cannot take NSAIDs because of his history of chronic GI ulcers. Also recommended he ice the area to help with the inflammation.

## 2013-02-16 ENCOUNTER — Telehealth: Payer: Self-pay | Admitting: *Deleted

## 2013-02-16 NOTE — Telephone Encounter (Signed)
Pt calls stating that the swelling on the side of his nose has went down some.  He states that it is still really sore & now has a purple color to it.  Please advise

## 2013-02-16 NOTE — Telephone Encounter (Signed)
Increase his Bactrim to 2 tabs twice a day. Has a total of 4 tabs daily. We can send her for new prescription if we need to.

## 2013-02-16 NOTE — Telephone Encounter (Signed)
Pt.notified

## 2013-05-23 ENCOUNTER — Other Ambulatory Visit: Payer: Self-pay | Admitting: Family Medicine

## 2013-07-07 ENCOUNTER — Other Ambulatory Visit: Payer: BC Managed Care – PPO | Admitting: Family Medicine

## 2013-07-13 ENCOUNTER — Other Ambulatory Visit: Payer: BC Managed Care – PPO | Admitting: Family Medicine

## 2013-07-14 ENCOUNTER — Ambulatory Visit (INDEPENDENT_AMBULATORY_CARE_PROVIDER_SITE_OTHER): Payer: BC Managed Care – PPO | Admitting: Physician Assistant

## 2013-07-14 ENCOUNTER — Encounter: Payer: Self-pay | Admitting: Physician Assistant

## 2013-07-14 VITALS — BP 125/74 | HR 90 | Wt 235.0 lb

## 2013-07-14 DIAGNOSIS — J449 Chronic obstructive pulmonary disease, unspecified: Secondary | ICD-10-CM

## 2013-07-14 NOTE — Progress Notes (Signed)
   Subjective:    Patient ID: Christopher [name], male    DOB: Dec 03, 1950, 63 y.o.   MRN: [id number]  HPI Pt is a 63 yo male who presents to the clinic to follow up on COPD and to have spirometry.Pt is doing fine today. He occasionally has to use rescue inhaler but not very often. Most of the time symptoms are controlled. 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 No wheezing.    Review of Systems     Objective:   Physical Exam  Constitutional: He is oriented to person, place, and time. He appears well-developed and well-nourished.  HENT:  Head: Normocephalic and atraumatic.  Cardiovascular: Normal rate, regular rhythm and normal heart sounds.   Pulmonary/Chest: Effort normal and breath sounds normal. He has no wheezes. He has no rales.  Neurological: He is alert and oriented to person, place, and time.  Skin: Skin is dry.  Psychiatric: He has a normal mood and affect. His behavior is normal.          Assessment & Plan:  COPD-  Spirometry: FVC 99%, FEV1 71%, ratio 56%. Moderate obstruction. Controlled today. Per pt did not need refills. Continue on spiriva daily and rescue inhaler as needed. 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 Follow up in 6 months or if symptoms worsen.

## 2013-08-04 ENCOUNTER — Encounter: Payer: Self-pay | Admitting: Family Medicine

## 2013-08-21 ENCOUNTER — Other Ambulatory Visit: Payer: Self-pay | Admitting: Family Medicine

## 2013-11-22 ENCOUNTER — Other Ambulatory Visit: Payer: Self-pay | Admitting: Family Medicine

## 2014-01-12 ENCOUNTER — Ambulatory Visit (INDEPENDENT_AMBULATORY_CARE_PROVIDER_SITE_OTHER): Payer: Medicare Other | Admitting: Family Medicine

## 2014-01-12 VITALS — BP 130/78 | HR 90 | Wt 235.0 lb

## 2014-01-12 DIAGNOSIS — Z23 Encounter for immunization: Secondary | ICD-10-CM

## 2014-01-12 DIAGNOSIS — F329 Major depressive disorder, single episode, unspecified: Secondary | ICD-10-CM

## 2014-01-12 DIAGNOSIS — F3289 Other specified depressive episodes: Secondary | ICD-10-CM

## 2014-01-12 DIAGNOSIS — E785 Hyperlipidemia, unspecified: Secondary | ICD-10-CM

## 2014-01-12 DIAGNOSIS — J449 Chronic obstructive pulmonary disease, unspecified: Secondary | ICD-10-CM

## 2014-01-12 DIAGNOSIS — F32A Depression, unspecified: Secondary | ICD-10-CM

## 2014-01-12 DIAGNOSIS — D509 Iron deficiency anemia, unspecified: Secondary | ICD-10-CM

## 2014-01-12 NOTE — Assessment & Plan Note (Signed)
Stable on current regimen. As he had back in 6 months to repeat spirometry at that time. Continue Spiriva daily. Encouraged him to try not to skip the medication but I do understand the cost issues. I did give him 10 days worth of samples today so hopefully that will help him the next couple months.

## 2014-01-12 NOTE — Assessment & Plan Note (Addendum)
Well-controlled on current regimen. Continue citalopram 20 mg daily. Followup in 6 months. PHQ 9 score of 1 today.

## 2014-01-12 NOTE — Progress Notes (Signed)
   Subjective:    Patient ID: Christopher [name], male    DOB: [birth year], 63 y.o.   MRN: [id number]  HPI F/U depression - Feels like mood is well controlled. Sleeping well.  On citalopram 20mg .    COPD - No changes in his status. Still SOB with going up hills. No recent flares.  Using his spiriva daily . Says has been skipping every 4th day to save money bc has hit his medicare gap.    Last episodes of pancreatitis was 14 months ago.   Hyperlipidemia-not on a statin. Try to control with diet and exercise. Due to recheck lipids.  Review of Systems     Objective:   Physical Exam  Constitutional: He is oriented to person, place, and time. He appears well-developed and well-nourished.  HENT:  Head: Normocephalic and atraumatic.  Cardiovascular: Normal rate, regular rhythm and normal heart sounds.   Pulmonary/Chest: Effort normal and breath sounds normal.  Neurological: He is alert and oriented to person, place, and time.  Skin: Skin is warm and dry.  Psychiatric: He has a normal mood and affect. His behavior is normal.          Assessment & Plan:  Flu shot given today.   Hyperlipidemia-due to recheck lipids. Lab slip provided today.  Iron deficiency anemia-due to recheck iron and ferritin levels. These have not been checked in the last year. Wanted to make sure that he is absorbing iron adequately.

## 2014-01-24 LAB — COMPLETE METABOLIC PANEL WITH GFR
ALT: 22 U/L (ref 0–53)
AST: 22 U/L (ref 0–37)
Albumin: 4.3 g/dL (ref 3.5–5.2)
Alkaline Phosphatase: 132 U/L — ABNORMAL HIGH (ref 39–117)
BUN: 25 mg/dL — ABNORMAL HIGH (ref 6–23)
CALCIUM: 9 mg/dL (ref 8.4–10.5)
CHLORIDE: 105 meq/L (ref 96–112)
CO2: 24 mEq/L (ref 19–32)
CREATININE: 1.18 mg/dL (ref 0.50–1.35)
GFR, Est African American: 75 mL/min
GFR, Est Non African American: 65 mL/min
Glucose, Bld: 92 mg/dL (ref 70–99)
Potassium: 4.8 mEq/L (ref 3.5–5.3)
Sodium: 138 mEq/L (ref 135–145)
TOTAL PROTEIN: 7.2 g/dL (ref 6.0–8.3)
Total Bilirubin: 0.7 mg/dL (ref 0.2–1.2)

## 2014-01-24 LAB — CBC WITH DIFFERENTIAL/PLATELET
BASOS ABS: 0 10*3/uL (ref 0.0–0.1)
Basophils Relative: 1 % (ref 0–1)
EOS ABS: 0.1 10*3/uL (ref 0.0–0.7)
EOS PCT: 3 % (ref 0–5)
HEMATOCRIT: 42.1 % (ref 39.0–52.0)
Hemoglobin: 14.6 g/dL (ref 13.0–17.0)
LYMPHS PCT: 29 % (ref 12–46)
Lymphs Abs: 1.2 10*3/uL (ref 0.7–4.0)
MCH: 32.3 pg (ref 26.0–34.0)
MCHC: 34.7 g/dL (ref 30.0–36.0)
MCV: 93.1 fL (ref 78.0–100.0)
MONO ABS: 0.4 10*3/uL (ref 0.1–1.0)
Monocytes Relative: 9 % (ref 3–12)
Neutro Abs: 2.5 10*3/uL (ref 1.7–7.7)
Neutrophils Relative %: 58 % (ref 43–77)
PLATELETS: 205 10*3/uL (ref 150–400)
RBC: 4.52 MIL/uL (ref 4.22–5.81)
RDW: 13 % (ref 11.5–15.5)
WBC: 4.3 10*3/uL (ref 4.0–10.5)

## 2014-01-24 LAB — IRON AND TIBC
%SAT: 39 % (ref 20–55)
Iron: 147 ug/dL (ref 42–165)
TIBC: 381 ug/dL (ref 215–435)
UIBC: 234 ug/dL (ref 125–400)

## 2014-01-24 LAB — LIPID PANEL
Cholesterol: 212 mg/dL — ABNORMAL HIGH (ref 0–200)
HDL: 40 mg/dL (ref >39)
LDL CALC: 136 mg/dL — AB (ref 0–99)
TRIGLYCERIDES: 179 mg/dL — AB (ref <150)
Total CHOL/HDL Ratio: 5.3 Ratio
VLDL: 36 mg/dL (ref 0–40)

## 2014-01-24 LAB — FERRITIN: Ferritin: 175 ng/mL (ref 22–322)

## 2014-01-26 ENCOUNTER — Encounter: Payer: Self-pay | Admitting: Family Medicine

## 2014-02-01 ENCOUNTER — Other Ambulatory Visit: Payer: Self-pay | Admitting: Family Medicine

## 2014-02-01 MED ORDER — SIMVASTATIN 40 MG PO TABS: 40.0000 mg | ORAL_TABLET | Freq: Every day | ORAL | Status: DC

## 2014-02-19 ENCOUNTER — Other Ambulatory Visit: Payer: Self-pay | Admitting: Emergency Medicine

## 2014-02-19 ENCOUNTER — Other Ambulatory Visit: Payer: Self-pay | Admitting: Family Medicine

## 2014-05-26 ENCOUNTER — Other Ambulatory Visit: Payer: Self-pay | Admitting: Family Medicine

## 2014-07-16 ENCOUNTER — Ambulatory Visit (INDEPENDENT_AMBULATORY_CARE_PROVIDER_SITE_OTHER): Payer: Medicare Other | Admitting: Family Medicine

## 2014-07-16 ENCOUNTER — Encounter: Payer: Self-pay | Admitting: Family Medicine

## 2014-07-16 VITALS — BP 134/86 | HR 83 | Wt 242.0 lb

## 2014-07-16 DIAGNOSIS — E785 Hyperlipidemia, unspecified: Secondary | ICD-10-CM

## 2014-07-16 DIAGNOSIS — J449 Chronic obstructive pulmonary disease, unspecified: Secondary | ICD-10-CM

## 2014-07-16 MED ORDER — ALBUTEROL SULFATE (2.5 MG/3ML) 0.083% IN NEBU
2.5000 mg | INHALATION_SOLUTION | Freq: Once | RESPIRATORY_TRACT | Status: AC
  Administered 2014-07-16: 2.5 mg via RESPIRATORY_TRACT

## 2014-07-16 MED ORDER — PITAVASTATIN CALCIUM 2 MG PO TABS: 2.0000 mg | ORAL_TABLET | Freq: Every day | ORAL | Status: DC

## 2014-07-16 NOTE — Progress Notes (Signed)
   Subjective:    Patient ID: Christopher [name], male    DOB: 02-24-1951, 64 y.o.   MRN: [id number]  HPI Here for spirometry today.Had a URI that started 10 days ago. Still has some cough but he is feeling much better.  Prior to this he was doing very well overall. He denies any daily cough but does have an occasional sputum type cough. He does sometimes get fatigued if he really exerts himself but denies any distinct wheezing or shortness of breath with activities.  Hyperlipidemia-he did try the simvastatin as recommended. Unfortunately he explains myalgias about 2-3 weeks into taking the medication. He finally discontinued it. He had a similar intolerance to pravastatin previously. He wants and if they're any other non-statin options for him. He did start taking some over-the-counter fish oil.  Review of Systems     Objective:   Physical Exam  Constitutional: He is oriented to person, place, and time. He appears well-developed and well-nourished.  HENT:  Head: Normocephalic and atraumatic.  Cardiovascular: Normal rate, regular rhythm and normal heart sounds.   Pulmonary/Chest: Effort normal and breath sounds normal.  Neurological: He is alert and oriented to person, place, and time.  Skin: Skin is warm and dry.  Psychiatric: He has a normal mood and affect. His behavior is normal.          Assessment & Plan:  COPD- still consistent with moderate airway obstruction. FVC of 89%, FEV1 of 64% with a ratio 55%. No significant change with post bronchodilator. We discussed continuing with current regimen. Has been trying to space out his Spiriva because of cost. He has Medicare gap at the end of last year and had to use it every other day. This year he has been taking it consistently on a daily basis thus far. If he gets to the point where it's not covered and he has to pay full price then please let us know. We may be able to provide samples to help him through the end of the year this next  year.   Hyperlipidemia - he is at high risk based on his 10 year cardiovascular risk score of 13.5%. Strongly recommend a statin. Encouraged him to at least try a third before we give up on the entire class. He is willing to try the Livalo. Samples provided. He will call me and let me know if he's able to tolerate it or not. It may not be covered on his insurance but Crestor might be an we may be able to use that one instead if needed.

## 2014-08-24 ENCOUNTER — Other Ambulatory Visit: Payer: Self-pay | Admitting: Family Medicine

## 2014-12-08 ENCOUNTER — Other Ambulatory Visit: Payer: Self-pay | Admitting: Family Medicine

## 2015-01-14 ENCOUNTER — Ambulatory Visit: Payer: Medicare Other | Admitting: Family Medicine

## 2015-02-04 ENCOUNTER — Encounter: Payer: Self-pay | Admitting: Family Medicine

## 2015-02-04 ENCOUNTER — Ambulatory Visit (INDEPENDENT_AMBULATORY_CARE_PROVIDER_SITE_OTHER): Payer: Medicare Other | Admitting: Family Medicine

## 2015-02-04 VITALS — BP 147/73 | HR 87 | Temp 98.2°F | Wt 252.0 lb

## 2015-02-04 DIAGNOSIS — Z6832 Body mass index (BMI) 32.0-32.9, adult: Secondary | ICD-10-CM | POA: Diagnosis not present

## 2015-02-04 DIAGNOSIS — E785 Hyperlipidemia, unspecified: Secondary | ICD-10-CM | POA: Diagnosis not present

## 2015-02-04 DIAGNOSIS — E669 Obesity, unspecified: Secondary | ICD-10-CM

## 2015-02-04 DIAGNOSIS — J449 Chronic obstructive pulmonary disease, unspecified: Secondary | ICD-10-CM

## 2015-02-04 DIAGNOSIS — Z23 Encounter for immunization: Secondary | ICD-10-CM | POA: Diagnosis not present

## 2015-02-04 DIAGNOSIS — Z114 Encounter for screening for human immunodeficiency virus [HIV]: Secondary | ICD-10-CM | POA: Diagnosis not present

## 2015-02-04 MED ORDER — PITAVASTATIN CALCIUM 2 MG PO TABS: 1.0000 | ORAL_TABLET | Freq: Every day | ORAL | Status: DC

## 2015-02-04 NOTE — Progress Notes (Signed)
   Subjective:    Patient ID: Christopher [name], male    DOB: 05-Aug-1950, 64 y.o.   MRN: [id number]  HPI COPD - six-month follow-up from spirometry. Spirometry back in February that showed moderate airway obstruction. At that time he had been spacing his Spiriva out because of cost. Will hit his Medicare gap in mid October again.  Has only missed a few doses this year..   Hyperlipidemia-given samples of Livalo when I last saw him in February. Says took them without any S.E.    Review of Systems     Objective:   Physical Exam  Constitutional: He is oriented to person, place, and time. He appears well-developed and well-nourished.  HENT:  Head: Normocephalic and atraumatic.  Cardiovascular: Normal rate, regular rhythm and normal heart sounds.   No carotid bruits.  Pulmonary/Chest: Effort normal and breath sounds normal.  Neurological: He is alert and oriented to person, place, and time.  Skin: Skin is warm and dry.  Psychiatric: He has a normal mood and affect. His behavior is normal.        Assessment & Plan:  COPD- Stable. No recent flares. When hits his gap can apply for aid from company directory or consider 30 days trial of incruse to help him get through    Hyperlipidemia - he was able to tolerate Livalo well, but unfortunately it doesn't look like it's reimbursed on his insurance but he is willing to try Crestor. Crestor sent for 10 mg a take at bedtime. We'll send a 30 day plot supply to start. He can go for his CMP and lipid panel in a couple weeks after getting on the medication. If he's not able to get it because of cost and please let us know.  Obesity/BMI32 - discussed that he has gained 10 lbs since I last saw him.  Most likely cause of his recent elevated BP.  Work on diet and exercise ad f/u in 3 months for BP.

## 2015-02-06 ENCOUNTER — Other Ambulatory Visit: Payer: Self-pay | Admitting: Family Medicine

## 2015-02-06 MED ORDER — ROSUVASTATIN CALCIUM 10 MG PO TABS: 10.0000 mg | ORAL_TABLET | Freq: Every day | ORAL | Status: DC

## 2015-02-15 ENCOUNTER — Other Ambulatory Visit: Payer: Self-pay | Admitting: Family Medicine

## 2015-04-06 ENCOUNTER — Other Ambulatory Visit: Payer: Self-pay | Admitting: Family Medicine

## 2015-06-06 ENCOUNTER — Ambulatory Visit: Payer: Medicare Other | Admitting: Family Medicine

## 2015-06-13 ENCOUNTER — Ambulatory Visit (INDEPENDENT_AMBULATORY_CARE_PROVIDER_SITE_OTHER): Payer: Medicare Other | Admitting: Family Medicine

## 2015-06-13 ENCOUNTER — Encounter: Payer: Self-pay | Admitting: Family Medicine

## 2015-06-13 VITALS — BP 152/98 | HR 79 | Ht 74.0 in | Wt 251.0 lb

## 2015-06-13 DIAGNOSIS — R030 Elevated blood-pressure reading, without diagnosis of hypertension: Secondary | ICD-10-CM

## 2015-06-13 DIAGNOSIS — Z1159 Encounter for screening for other viral diseases: Secondary | ICD-10-CM | POA: Diagnosis not present

## 2015-06-13 DIAGNOSIS — Z114 Encounter for screening for human immunodeficiency virus [HIV]: Secondary | ICD-10-CM

## 2015-06-13 DIAGNOSIS — E785 Hyperlipidemia, unspecified: Secondary | ICD-10-CM

## 2015-06-13 DIAGNOSIS — J449 Chronic obstructive pulmonary disease, unspecified: Secondary | ICD-10-CM | POA: Diagnosis not present

## 2015-06-13 DIAGNOSIS — IMO0001 Reserved for inherently not codable concepts without codable children: Secondary | ICD-10-CM

## 2015-06-13 DIAGNOSIS — Z23 Encounter for immunization: Secondary | ICD-10-CM

## 2015-06-13 NOTE — Progress Notes (Signed)
   Subjective:    Patient ID: Christopher [name], male    DOB: 04/07/1951, 65 y.o.   MRN: [id number]  HPI Elevated BP - here for recheck on pressure. No prior dx but was high at last OV.     Hyperlipidemia- has been off his Crestor for 2 months.  Says he stopped it and can't remember why. Thinks he had side effects. He was supposed go to the lab in September that forgot to.  Has been more stress. He has gained 9 lbs in the year.  He would like to weigh 220 lb.    COPD - had a cold a few weeks ago.  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  Has actually been doing well without any recent exacerbations.  Review of Systems     Objective:   Physical Exam  Constitutional: He is oriented to person, place, and time. He appears well-developed and well-nourished.  HENT:  Head: Normocephalic and atraumatic.  Cardiovascular: Normal rate, regular rhythm and normal heart sounds.   Pulmonary/Chest: Effort normal and breath sounds normal.  Neurological: He is alert and oriented to person, place, and time.  Skin: Skin is warm and dry.  Psychiatric: He has a normal mood and affect. His behavior is normal.          Assessment & Plan:  Elevated BP - repeat blood pressure was still elevated today. I like to have them come back in 1 week for recheck. We discussed starting him on medication versus working on diet and exercise over the next 2-3 months. He really wants to work on that first before starting medication. I think it's reasonable to give him that time to make some dietary changes. Also given handout on the DASH diet.  Hyperlipidemia - encouraged him to restart the Crestor and at least try it again. He wants to see what his lipid levels are before he restarts the medication and plans to go to the lab.  COPD - stable. No recent exacerbations. Recommend Prevnar 13 today.  Prenvar 13 given today.   Discussed need for hepatitis C testing.

## 2015-06-13 NOTE — Patient Instructions (Signed)
DASH Eating Plan  DASH stands for "Dietary Approaches to Stop Hypertension." The DASH eating plan is a healthy eating plan that has been shown to reduce high blood pressure (hypertension). Additional health benefits may include reducing the risk of type 2 diabetes mellitus, heart disease, and stroke. The DASH eating plan may also help with weight loss.  WHAT DO I NEED TO KNOW ABOUT THE DASH EATING PLAN?  For the DASH eating plan, you will follow these general guidelines:  · Choose foods with a percent daily value for sodium of less than 5% (as listed on the food label).  · Use salt-free seasonings or herbs instead of table salt or sea salt.  · Check with your health care provider or pharmacist before using salt substitutes.  · Eat lower-sodium products, often labeled as "lower sodium" or "no salt added."  · Eat fresh foods.  · Eat more vegetables, fruits, and low-fat dairy products.  · Choose whole grains. Look for the word "whole" as the first word in the ingredient list.  · Choose fish and skinless chicken or turkey more often than red meat. Limit fish, poultry, and meat to 6 oz (170 g) each day.  · Limit sweets, desserts, sugars, and sugary drinks.  · Choose heart-healthy fats.  · Limit cheese to 1 oz (28 g) per day.  · Eat more home-cooked food and less restaurant, buffet, and fast food.  · Limit fried foods.  · Cook foods using methods other than frying.  · Limit canned vegetables. If you do use them, rinse them well to decrease the sodium.  · When eating at a restaurant, ask that your food be prepared with less salt, or no salt if possible.  WHAT FOODS CAN I EAT?  Seek help from a dietitian for individual calorie needs.  Grains  Whole grain or whole wheat bread. Brown rice. Whole grain or whole wheat pasta. Quinoa, bulgur, and whole grain cereals. Low-sodium cereals. Corn or whole wheat flour tortillas. Whole grain cornbread. Whole grain crackers. Low-sodium crackers.  Vegetables  Fresh or frozen vegetables  (raw, steamed, roasted, or grilled). Low-sodium or reduced-sodium tomato and vegetable juices. Low-sodium or reduced-sodium tomato sauce and paste. Low-sodium or reduced-sodium canned vegetables.   Fruits  All fresh, canned (in natural juice), or frozen fruits.  Meat and Other Protein Products  Ground beef (85% or leaner), grass-fed beef, or beef trimmed of fat. Skinless chicken or turkey. Ground chicken or turkey. Pork trimmed of fat. All fish and seafood. Eggs. Dried beans, peas, or lentils. Unsalted nuts and seeds. Unsalted canned beans.  Dairy  Low-fat dairy products, such as skim or 1% milk, 2% or reduced-fat cheeses, low-fat ricotta or cottage cheese, or plain low-fat yogurt. Low-sodium or reduced-sodium cheeses.  Fats and Oils  Tub margarines without trans fats. Light or reduced-fat mayonnaise and salad dressings (reduced sodium). Avocado. Safflower, olive, or canola oils. Natural peanut or almond butter.  Other  Unsalted popcorn and pretzels.  The items listed above may not be a complete list of recommended foods or beverages. Contact your dietitian for more options.  WHAT FOODS ARE NOT RECOMMENDED?  Grains  White bread. White pasta. White rice. Refined cornbread. Bagels and croissants. Crackers that contain trans fat.  Vegetables  Creamed or fried vegetables. Vegetables in a cheese sauce. Regular canned vegetables. Regular canned tomato sauce and paste. Regular tomato and vegetable juices.  Fruits  Dried fruits. Canned fruit in light or heavy syrup. Fruit juice.  Meat and Other Protein   Products  Fatty cuts of meat. Ribs, chicken wings, bacon, sausage, bologna, salami, chitterlings, fatback, hot dogs, bratwurst, and packaged luncheon meats. Salted nuts and seeds. Canned beans with salt.  Dairy  Whole or 2% milk, cream, half-and-half, and cream cheese. Whole-fat or sweetened yogurt. Full-fat cheeses or blue cheese. Nondairy creamers and whipped toppings. Processed cheese, cheese spreads, or cheese  curds.  Condiments  Onion and garlic salt, seasoned salt, table salt, and sea salt. Canned and packaged gravies. Worcestershire sauce. Tartar sauce. Barbecue sauce. Teriyaki sauce. Soy sauce, including reduced sodium. Steak sauce. Fish sauce. Oyster sauce. Cocktail sauce. Horseradish. Ketchup and mustard. Meat flavorings and tenderizers. Bouillon cubes. Hot sauce. Tabasco sauce. Marinades. Taco seasonings. Relishes.  Fats and Oils  Butter, stick margarine, lard, shortening, ghee, and bacon fat. Coconut, palm kernel, or palm oils. Regular salad dressings.  Other  Pickles and olives. Salted popcorn and pretzels.  The items listed above may not be a complete list of foods and beverages to avoid. Contact your dietitian for more information.  WHERE CAN I FIND MORE INFORMATION?  National Heart, Lung, and [employer] Institute: www.nhlbi.nih.gov/health/health-topics/topics/dash/     This information is not intended to replace advice given to you by your health care provider. Make sure you discuss any questions you have with your health care provider.     Document Released: 04/30/2011 Document Revised: 06/01/2014 Document Reviewed: 03/15/2013  Elsevier Interactive Patient Education ©2016 [employer] Inc[employer]

## 2015-06-21 ENCOUNTER — Other Ambulatory Visit: Payer: Self-pay | Admitting: Emergency Medicine

## 2015-06-21 DIAGNOSIS — E785 Hyperlipidemia, unspecified: Secondary | ICD-10-CM

## 2015-06-21 LAB — COMPLETE METABOLIC PANEL WITH GFR
ALT: 22 U/L (ref 9–46)
AST: 23 U/L (ref 10–35)
Albumin: 4.2 g/dL (ref 3.6–5.1)
Alkaline Phosphatase: 103 U/L (ref 40–115)
BUN: 19 mg/dL (ref 7–25)
CHLORIDE: 105 mmol/L (ref 98–110)
CO2: 24 mmol/L (ref 20–31)
CREATININE: 1.27 mg/dL — AB (ref 0.70–1.25)
Calcium: 8.9 mg/dL (ref 8.6–10.3)
GFR, Est African American: 68 mL/min (ref >=60)
GFR, Est Non African American: 59 mL/min — ABNORMAL LOW (ref >=60)
GLUCOSE: 101 mg/dL — AB (ref 65–99)
Potassium: 4.7 mmol/L (ref 3.5–5.3)
Sodium: 141 mmol/L (ref 135–146)
TOTAL PROTEIN: 7.1 g/dL (ref 6.1–8.1)
Total Bilirubin: 0.8 mg/dL (ref 0.2–1.2)

## 2015-06-21 LAB — HIV ANTIBODY (ROUTINE TESTING W REFLEX)
HIV 1&2 Ab, 4th Generation: NONREACTIVE
HIV: NONREACTIVE

## 2015-06-21 LAB — HEPATITIS C ANTIBODY: HCV AB: NEGATIVE

## 2015-06-21 LAB — LIPID PANEL
Cholesterol: 194 mg/dL (ref 125–200)
HDL: 39 mg/dL — ABNORMAL LOW (ref >=40)
LDL Cholesterol: 133 mg/dL — ABNORMAL HIGH (ref <130)
Total CHOL/HDL Ratio: 5 Ratio (ref <=5.0)
Triglycerides: 108 mg/dL (ref <150)
VLDL: 22 mg/dL (ref <30)

## 2015-06-21 MED ORDER — ROSUVASTATIN CALCIUM 10 MG PO TABS: 10.0000 mg | ORAL_TABLET | Freq: Every day | ORAL | Status: DC

## 2015-07-16 ENCOUNTER — Ambulatory Visit (INDEPENDENT_AMBULATORY_CARE_PROVIDER_SITE_OTHER): Payer: Medicare Other | Admitting: Family Medicine

## 2015-07-16 ENCOUNTER — Encounter: Payer: Self-pay | Admitting: Family Medicine

## 2015-07-16 VITALS — BP 125/76 | HR 81 | Ht 74.0 in | Wt 240.0 lb

## 2015-07-16 DIAGNOSIS — J449 Chronic obstructive pulmonary disease, unspecified: Secondary | ICD-10-CM

## 2015-07-16 DIAGNOSIS — E785 Hyperlipidemia, unspecified: Secondary | ICD-10-CM | POA: Diagnosis not present

## 2015-07-16 MED ORDER — ROSUVASTATIN CALCIUM 10 MG PO TABS: 10.0000 mg | ORAL_TABLET | Freq: Every day | ORAL | Status: DC

## 2015-07-16 MED ORDER — ALBUTEROL SULFATE (2.5 MG/3ML) 0.083% IN NEBU
2.5000 mg | INHALATION_SOLUTION | Freq: Once | RESPIRATORY_TRACT | Status: DC
  Administered 2015-07-16: 2.5 mg via RESPIRATORY_TRACT

## 2015-07-16 MED ORDER — UMECLIDINIUM-VILANTEROL 62.5-25 MCG/INH IN AEPB: 1.0000 | INHALATION_SPRAY | Freq: Every day | RESPIRATORY_TRACT | Status: DC

## 2015-07-16 NOTE — Progress Notes (Signed)
   Subjective:    Patient ID: [name], male    DOB: 12-21-1950, 65 y.o.   MRN: [id number]  HPI Patient is here today to follow-up for COPD-he is due for repeat spirometry.  He is currently on Spiriva and uses albuterol as needed. He is very physically active and continues to work. He says he mostly notices the shortness of breath when going up flights of stairs.  Hyperlipidemia-he did have some questions about his cholesterol and whether or not he needs to be taking medication for it or not. He checked to the pharmacy and they had said the prescription had not been sent.   Review of Systems     Objective:   Physical Exam  Constitutional: He is oriented to person, place, and time. He appears well-developed and well-nourished.  HENT:  Head: Normocephalic and atraumatic.  Cardiovascular: Normal rate, regular rhythm and normal heart sounds.   Pulmonary/Chest: Effort normal and breath sounds normal.  Neurological: He is alert and oriented to person, place, and time.  Skin: Skin is warm and dry.  Psychiatric: He has a normal mood and affect. His behavior is normal.          Assessment & Plan:  COPD-spirometry today shows FVC of 95%, FEV1 65% and ratio of 52%. There has been a slight decline in FEV1 since 2015 when he was at 71%.  we discussed several options. I would like to put him on combination therapy with a long-acting albuterol. It looks like that his insurance will cover Anoro. New prescription sent to the pharmacy and coupon card given for 30 day free trial.   Hyperlipidemia. - he has not picked u pthe crestor yeWe'll send over new prescription today and did encourage him to take it. We'll recheck lipids and liver enzymes after about 3 months.t.

## 2015-08-26 ENCOUNTER — Other Ambulatory Visit: Payer: Self-pay | Admitting: Family Medicine

## 2015-09-09 ENCOUNTER — Ambulatory Visit: Payer: Medicare Other | Admitting: Family Medicine

## 2015-09-16 ENCOUNTER — Ambulatory Visit (INDEPENDENT_AMBULATORY_CARE_PROVIDER_SITE_OTHER): Payer: Medicare Other | Admitting: Family Medicine

## 2015-09-16 ENCOUNTER — Encounter: Payer: Self-pay | Admitting: Family Medicine

## 2015-09-16 VITALS — BP 127/87 | HR 105 | Wt 242.0 lb

## 2015-09-16 DIAGNOSIS — E785 Hyperlipidemia, unspecified: Secondary | ICD-10-CM | POA: Diagnosis not present

## 2015-09-16 DIAGNOSIS — J209 Acute bronchitis, unspecified: Secondary | ICD-10-CM | POA: Diagnosis not present

## 2015-09-16 DIAGNOSIS — J441 Chronic obstructive pulmonary disease with (acute) exacerbation: Secondary | ICD-10-CM | POA: Diagnosis not present

## 2015-09-16 DIAGNOSIS — J018 Other acute sinusitis: Secondary | ICD-10-CM

## 2015-09-16 DIAGNOSIS — F329 Major depressive disorder, single episode, unspecified: Secondary | ICD-10-CM

## 2015-09-16 DIAGNOSIS — F32A Depression, unspecified: Secondary | ICD-10-CM

## 2015-09-16 DIAGNOSIS — J449 Chronic obstructive pulmonary disease, unspecified: Secondary | ICD-10-CM

## 2015-09-16 MED ORDER — AMOXICILLIN-POT CLAVULANATE 875-125 MG PO TABS: 1.0000 | ORAL_TABLET | Freq: Two times a day (BID) | ORAL | Status: DC

## 2015-09-16 MED ORDER — UMECLIDINIUM-VILANTEROL 62.5-25 MCG/INH IN AEPB: 1.0000 | INHALATION_SPRAY | Freq: Every day | RESPIRATORY_TRACT | Status: DC

## 2015-09-16 MED ORDER — ROSUVASTATIN CALCIUM 10 MG PO TABS: 10.0000 mg | ORAL_TABLET | Freq: Every day | ORAL | Status: DC

## 2015-09-16 MED ORDER — ALBUTEROL SULFATE HFA 108 (90 BASE) MCG/ACT IN AERS: 2.0000 | INHALATION_SPRAY | Freq: Four times a day (QID) | RESPIRATORY_TRACT | Status: DC | PRN

## 2015-09-16 MED ORDER — CITALOPRAM HYDROBROMIDE 20 MG PO TABS
20.0000 mg | ORAL_TABLET | Freq: Every day | ORAL | Status: DC
Start: 2015-09-16 — End: 2016-04-06

## 2015-09-16 MED ORDER — OMEPRAZOLE 20 MG PO CPDR: 20.0000 mg | DELAYED_RELEASE_CAPSULE | Freq: Every day | ORAL | Status: DC

## 2015-09-16 NOTE — Progress Notes (Signed)
Subjective:    Patient ID: Christopher [name], male    DOB: 1951/02/17, 65 y.o.   MRN: [id number]  HPI 8 week f/U for COPD - He is doing well overall but does need refill on his medication. He is currently on Anoro and albuterol. Started him on Anoro when I saw him last time. He is doing great. He says he felt noticeable improvement after switching from Spiriva. He is happy with his current regimen and would like a 90 day supply sent to the pharmacy. He'll be traveling back up [place] to visit his mother who recently suffered a pelvic fracture.  He also complains of a runny nose, nasal congestion, sinus congestion 8-9 days. No fever chills or sweats. He's been using NyQuil mostly. He said it started after recent travel. It started with chest symptoms and then moved up into nasal symptoms. He doesn't feel like he is getting any better but doesn't feel like he is getting worse either.  Also here for follow-up depression. He is currently on citalopram 20 mg daily. He does complain of feeling down several days of the week but denies losing pleasure in doing things. Also was about a little bit of low energy and some sleep issues. No thoughts of wanting to harm himself.   Review of Systems  BP 127/87 mmHg  Pulse 105  Wt 242 lb (109.77 kg)  SpO2 99%    Allergies  Allergen Reactions  . Lorazepam Other (See Comments)    hallucinations  . Nsaids Other (See Comments)    Hx of chronic recurrent GI ulcer.   . Onion Nausea And Vomiting  . Pravastatin Other (See Comments)    myalgias  . Simvastatin Other (See Comments)    myalgias    Past Medical History  Diagnosis Date  . Gastric ulcer   . Pancreatitis     11/2010. 2011    Past Surgical History  Procedure Laterality Date  . Cholecystectomy    . Abdominal reconstructin      X 3  . Roux-en-y gastric bypass      Social History   Social History  . Marital Status: Divorced    Spouse Name: N/A  . Number of Children: N/A  . Years of  Education: N/A   Occupational History  . Not on file.   Social History Main Topics  . Smoking status: Former Research scientist (life sciences)  . Smokeless tobacco: Not on file  . Alcohol Use: No  . Drug Use: No  . Sexual Activity: Not on file   Other Topics Concern  . Not on file   Social History Narrative    Family History  Problem Relation Age of Onset  . Hypertension Mother   . Cancer Father     prostate, lip cancer  . Heart attack Father   . Cancer Sister   . Cancer Brother     Outpatient Encounter Prescriptions as of 09/16/2015  Medication Sig  . albuterol (PROAIR HFA) 108 (90 Base) MCG/ACT inhaler Inhale 2 puffs into the lungs every 6 (six) hours as needed for wheezing.  Marland Kitchen amoxicillin-clavulanate (AUGMENTIN) 875-125 MG tablet Take 1 tablet by mouth 2 (two) times daily.  . citalopram (CELEXA) 20 MG tablet Take 1 tablet (20 mg total) by mouth daily.  Marland Kitchen omeprazole (PRILOSEC) 20 MG capsule Take 1 capsule (20 mg total) by mouth daily.  . rosuvastatin (CRESTOR) 10 MG tablet Take 1 tablet (10 mg total) by mouth daily.  Marland Kitchen umeclidinium-vilanterol (ANORO ELLIPTA) 62.5-25 MCG/INH AEPB Inhale  1 puff into the lungs daily.  . [DISCONTINUED] albuterol (PROAIR HFA) 108 (90 BASE) MCG/ACT inhaler Inhale 2 puffs into the lungs every 6 (six) hours as needed for wheezing.  . [DISCONTINUED] citalopram (CELEXA) 20 MG tablet Take 1 tablet (20 mg total) by mouth daily. Overdue for f/u appt. No Future refills  . [DISCONTINUED] omeprazole (PRILOSEC) 20 MG capsule TAKE ONE CAPSULE BY MOUTH EVERY DAY  . [DISCONTINUED] rosuvastatin (CRESTOR) 10 MG tablet Take 1 tablet (10 mg total) by mouth daily.  . [DISCONTINUED] rosuvastatin (CRESTOR) 10 MG tablet Take 1 tablet (10 mg total) by mouth daily.  . [DISCONTINUED] umeclidinium-vilanterol (ANORO ELLIPTA) 62.5-25 MCG/INH AEPB Inhale 1 puff into the lungs daily.  . [DISCONTINUED] umeclidinium-vilanterol (ANORO ELLIPTA) 62.5-25 MCG/INH AEPB Inhale 1 puff into the lungs daily.    No facility-administered encounter medications on file as of 09/16/2015.          Objective:   Physical Exam  Constitutional: He is oriented to person, place, and time. He appears well-developed and well-nourished.  HENT:  Head: Normocephalic and atraumatic.  Right Ear: External ear normal.  Left Ear: External ear normal.  Nose: Nose normal.  Mouth/Throat: Oropharynx is clear and moist.  TMs and canals are clear.   Eyes: Conjunctivae and EOM are normal. Pupils are equal, round, and reactive to light.  Neck: Neck supple. No thyromegaly present.  Cardiovascular: Normal rate and normal heart sounds.   Pulmonary/Chest: Effort normal and breath sounds normal.  Inspiratory wheeze and rhonchi at the left lung base.  Lymphadenopathy:    He has no cervical adenopathy.  Neurological: He is alert and oriented to person, place, and time.  Skin: Skin is warm and dry.  Psychiatric: He has a normal mood and affect.          Assessment & Plan:  COPD with exacerbation-continue current regimen with Anoro. Follow-up in 6 months.  Acute sinusitis/bronchitis-we'll treat with Augmentin. Call if not better in one week.  Depression-PHQ 9 score of 4 today. Rates his symptoms as not difficult at all. Continue with citalopram 10 mg daily. Follow-up in 6 months.  Hyperlipidemia-would like to refill Crestor for 90 day supply as well. New prescription sent to pharmacy.

## 2016-03-17 ENCOUNTER — Ambulatory Visit (INDEPENDENT_AMBULATORY_CARE_PROVIDER_SITE_OTHER): Payer: Medicare Other | Admitting: Family Medicine

## 2016-03-17 ENCOUNTER — Encounter: Payer: Self-pay | Admitting: Family Medicine

## 2016-03-17 VITALS — BP 148/88 | HR 80 | Ht 74.0 in | Wt 244.0 lb

## 2016-03-17 DIAGNOSIS — Z23 Encounter for immunization: Secondary | ICD-10-CM | POA: Diagnosis not present

## 2016-03-17 DIAGNOSIS — J410 Simple chronic bronchitis: Secondary | ICD-10-CM | POA: Diagnosis not present

## 2016-03-17 DIAGNOSIS — E784 Other hyperlipidemia: Secondary | ICD-10-CM

## 2016-03-17 DIAGNOSIS — E7849 Other hyperlipidemia: Secondary | ICD-10-CM

## 2016-03-17 DIAGNOSIS — F325 Major depressive disorder, single episode, in full remission: Secondary | ICD-10-CM | POA: Diagnosis not present

## 2016-03-17 DIAGNOSIS — J449 Chronic obstructive pulmonary disease, unspecified: Secondary | ICD-10-CM | POA: Diagnosis not present

## 2016-03-17 NOTE — Patient Instructions (Signed)
You are do for pneumovax 23 booster in January of 2018.

## 2016-03-17 NOTE — Progress Notes (Signed)
Subjective:    CC:   HPI:  COPD - Six-month follow-up. He is currently on Anoro and albuterol.  He has not had any recent exacerbations and has been doing well with the fall allergy season and shift in weather. He does have some on Spiriva and wonders if he can use that instead of the Anoro since he has had his Medicare gap.  Follow-up depression-currently on citalopram 20 mg daily. Mood is very well controlled. PHQ 9 he only marked some issues with sleep and it was just several days of the week. He rate his symptoms as not difficult at all.   Hyperlipidemia-we switch him to rosuvastatin and he's been doing well on that without any side effects or myalgias. We have not rechecked his lipids since then.  Past medical history, Surgical history, Family history not pertinant except as noted below, Social history, Allergies, and medications have been entered into the medical record, reviewed, and corrections made.   Review of Systems: No fevers, chills, night sweats, weight loss, chest pain, or shortness of breath.   Objective:    General: Well Developed, well nourished, and in no acute distress.  Neuro: Alert and oriented x3, extra-ocular muscles intact, sensation grossly intact.  HEENT: Normocephalic, atraumatic  Skin: Warm and dry, no rashes. Cardiac: Regular rate and rhythm, no murmurs rubs or gallops, no lower extremity edema.  Respiratory: Clear to auscultation bilaterally. Not using accessory muscles, speaking in full sentences.   Impression and Recommendations:   COPD-okay to substitute Spiriva for the next month or 2 since he has had his Medicare gap. Okay to change back to [facility] when he is able to. Continue to monitor for change or increase in symptoms. He will be due for a Pneumovax 23 booster in January.  Depression-PHQ 9 score of 1.  In remission. Well-controlled. Continue current regimen. Follow up in 6 months. If he is doing well at that point time we can discuss whether or not  to wean the medication.  Hyperlipidemia-due to recheck lipids on the rosuvastatin.

## 2016-04-06 ENCOUNTER — Other Ambulatory Visit: Payer: Self-pay

## 2016-04-06 MED ORDER — CITALOPRAM HYDROBROMIDE 20 MG PO TABS: 20.0000 mg | ORAL_TABLET | Freq: Every day | ORAL | 1 refills | Status: DC

## 2016-04-10 DIAGNOSIS — E784 Other hyperlipidemia: Secondary | ICD-10-CM | POA: Diagnosis not present

## 2016-04-10 LAB — COMPLETE METABOLIC PANEL WITH GFR
ALBUMIN: 4.5 g/dL (ref 3.6–5.1)
ALK PHOS: 104 U/L (ref 40–115)
ALT: 24 U/L (ref 9–46)
AST: 26 U/L (ref 10–35)
BILIRUBIN TOTAL: 0.7 mg/dL (ref 0.2–1.2)
BUN: 21 mg/dL (ref 7–25)
CALCIUM: 9.2 mg/dL (ref 8.6–10.3)
CO2: 25 mmol/L (ref 20–31)
Chloride: 106 mmol/L (ref 98–110)
Creat: 1.3 mg/dL — ABNORMAL HIGH (ref 0.70–1.25)
GFR, EST AFRICAN AMERICAN: 66 mL/min (ref >=60)
GFR, EST NON AFRICAN AMERICAN: 57 mL/min — AB (ref >=60)
GLUCOSE: 101 mg/dL — AB (ref 65–99)
Potassium: 4.7 mmol/L (ref 3.5–5.3)
Sodium: 140 mmol/L (ref 135–146)
TOTAL PROTEIN: 7.2 g/dL (ref 6.1–8.1)

## 2016-04-10 LAB — LIPID PANEL
Cholesterol: 156 mg/dL (ref <200)
HDL: 44 mg/dL (ref >40)
LDL Cholesterol: 95 mg/dL (ref <100)
TRIGLYCERIDES: 86 mg/dL (ref <150)
Total CHOL/HDL Ratio: 3.5 Ratio (ref <5.0)
VLDL: 17 mg/dL (ref <30)

## 2016-04-12 ENCOUNTER — Encounter: Payer: Self-pay | Admitting: Family Medicine

## 2016-04-12 DIAGNOSIS — N183 Chronic kidney disease, stage 3 unspecified: Secondary | ICD-10-CM | POA: Insufficient documentation

## 2016-08-01 DIAGNOSIS — R4182 Altered mental status, unspecified: Secondary | ICD-10-CM | POA: Diagnosis not present

## 2016-08-01 DIAGNOSIS — F1920 Other psychoactive substance dependence, uncomplicated: Secondary | ICD-10-CM | POA: Diagnosis not present

## 2016-08-01 DIAGNOSIS — Z743 Need for continuous supervision: Secondary | ICD-10-CM | POA: Diagnosis not present

## 2016-08-01 DIAGNOSIS — R55 Syncope and collapse: Secondary | ICD-10-CM | POA: Diagnosis not present

## 2016-08-31 NOTE — Progress Notes (Signed)
Subjective:    CC: COPD, Depression - 6 mo f/u HPI:   COPD - Overall he is doing really well. He did switch back to Spiriva temporarily when he hit Medicare gap. And then didn't take anything for about a month because of the cost. He is back on his and wrote and says he does notice a difference when he uses it.  Depression - doing well on citalopram without any side effects or problems. Happy with his current regimen. PHQ 9 score of 1 and GAD 7 score of 2 today.  INsomnia - he still having a little bit of difficulty following asleep. He says he does drink energy drinks and he says that he knows I have encouraged him to cut these out some but really hasn't done that. He says it's not super bothersome.  He will be traveling to [place] for several months and so wants to make sure that his medications have 90 day refills on them.  Past medical history, Surgical history, Family history not pertinant except as noted below, Social history, Allergies, and medications have been entered into the medical record, reviewed, and corrections made.   Review of Systems: No fevers, chills, night sweats, weight loss, chest pain, or shortness of breath.   Objective:    General: Well Developed, well nourished, and in no acute distress.  Neuro: Alert and oriented x3, extra-ocular muscles intact, sensation grossly intact.  HEENT: Normocephalic, atraumatic  Skin: Warm and dry, no rashes. Cardiac: Regular rate and rhythm, no murmurs rubs or gallops, no lower extremity edema.  Respiratory: Clear to auscultation bilaterally. Not using accessory muscles, speaking in full sentences.   Impression and Recommendations:   COPD - Stable. Continue Anoro and when necessary albuterol.  Depression - continue citalopram. PHQ 9 score of 1 and got 7 score of 2. Well controlled. Follow-up in 6 months.  ckd 3 - due to recheck renal function. Follow-up in 6 months.  Insomnia - still encouraged him to reduce/avoid  caffeine.  Given Pneumovax 23 today

## 2016-09-01 ENCOUNTER — Ambulatory Visit (INDEPENDENT_AMBULATORY_CARE_PROVIDER_SITE_OTHER): Payer: Medicare Other | Admitting: Family Medicine

## 2016-09-01 ENCOUNTER — Encounter: Payer: Self-pay | Admitting: Family Medicine

## 2016-09-01 VITALS — BP 177/90 | HR 78 | Wt 235.0 lb

## 2016-09-01 DIAGNOSIS — J410 Simple chronic bronchitis: Secondary | ICD-10-CM | POA: Diagnosis not present

## 2016-09-01 DIAGNOSIS — Z23 Encounter for immunization: Secondary | ICD-10-CM

## 2016-09-01 DIAGNOSIS — G4700 Insomnia, unspecified: Secondary | ICD-10-CM

## 2016-09-01 DIAGNOSIS — N183 Chronic kidney disease, stage 3 unspecified: Secondary | ICD-10-CM

## 2016-09-01 DIAGNOSIS — E7800 Pure hypercholesterolemia, unspecified: Secondary | ICD-10-CM | POA: Diagnosis not present

## 2016-09-01 DIAGNOSIS — F325 Major depressive disorder, single episode, in full remission: Secondary | ICD-10-CM | POA: Diagnosis not present

## 2016-09-01 DIAGNOSIS — Z283 Underimmunization status: Secondary | ICD-10-CM | POA: Diagnosis not present

## 2016-09-01 DIAGNOSIS — Z2839 Other underimmunization status: Secondary | ICD-10-CM

## 2016-09-01 MED ORDER — UMECLIDINIUM-VILANTEROL 62.5-25 MCG/INH IN AEPB: 1.0000 | INHALATION_SPRAY | Freq: Every day | RESPIRATORY_TRACT | 3 refills | Status: DC

## 2016-09-01 MED ORDER — ALBUTEROL SULFATE HFA 108 (90 BASE) MCG/ACT IN AERS: 2.0000 | INHALATION_SPRAY | Freq: Four times a day (QID) | RESPIRATORY_TRACT | 99 refills | Status: DC | PRN

## 2016-09-01 MED ORDER — ROSUVASTATIN CALCIUM 10 MG PO TABS: 10.0000 mg | ORAL_TABLET | Freq: Every day | ORAL | 3 refills | Status: DC

## 2016-09-01 MED ORDER — OMEPRAZOLE 20 MG PO CPDR: 20.0000 mg | DELAYED_RELEASE_CAPSULE | Freq: Every day | ORAL | 3 refills | Status: DC

## 2016-09-01 MED ORDER — CITALOPRAM HYDROBROMIDE 20 MG PO TABS: 20.0000 mg | ORAL_TABLET | Freq: Every day | ORAL | 3 refills | Status: DC

## 2016-09-07 DIAGNOSIS — N183 Chronic kidney disease, stage 3 (moderate): Secondary | ICD-10-CM | POA: Diagnosis not present

## 2016-09-07 LAB — BASIC METABOLIC PANEL WITH GFR
BUN: 23 mg/dL (ref 7–25)
CHLORIDE: 104 mmol/L (ref 98–110)
CO2: 22 mmol/L (ref 20–31)
Calcium: 9 mg/dL (ref 8.6–10.3)
Creat: 1.1 mg/dL (ref 0.70–1.25)
GFR, EST AFRICAN AMERICAN: 80 mL/min (ref >=60)
GFR, Est Non African American: 70 mL/min (ref >=60)
Glucose, Bld: 93 mg/dL (ref 65–99)
POTASSIUM: 4.9 mmol/L (ref 3.5–5.3)
SODIUM: 141 mmol/L (ref 135–146)

## 2016-09-08 NOTE — Progress Notes (Signed)
All labs are normal. 

## 2017-03-02 ENCOUNTER — Encounter: Payer: Self-pay | Admitting: Family Medicine

## 2017-03-02 ENCOUNTER — Ambulatory Visit (INDEPENDENT_AMBULATORY_CARE_PROVIDER_SITE_OTHER): Payer: Medicare Other | Admitting: Family Medicine

## 2017-03-02 VITALS — BP 138/81 | HR 79 | Ht 74.0 in | Wt 230.0 lb

## 2017-03-02 DIAGNOSIS — N183 Chronic kidney disease, stage 3 unspecified: Secondary | ICD-10-CM

## 2017-03-02 DIAGNOSIS — E7849 Other hyperlipidemia: Secondary | ICD-10-CM

## 2017-03-02 DIAGNOSIS — Z125 Encounter for screening for malignant neoplasm of prostate: Secondary | ICD-10-CM | POA: Diagnosis not present

## 2017-03-02 DIAGNOSIS — Z23 Encounter for immunization: Secondary | ICD-10-CM

## 2017-03-02 DIAGNOSIS — J410 Simple chronic bronchitis: Secondary | ICD-10-CM | POA: Diagnosis not present

## 2017-03-02 DIAGNOSIS — F325 Major depressive disorder, single episode, in full remission: Secondary | ICD-10-CM | POA: Diagnosis not present

## 2017-03-02 NOTE — Progress Notes (Signed)
Subjective:    CC: COPD, mood    HPI:  6 mo f/u for COPD - He is doing well overall. He uses his daily controller medication regularly. He says he can't remember the last time he uses albuterol. He has done well.Thought this summer noticed a slight difference when he was exposed to smoke in the air while visiting up [place].   Depression - slight mood is well controlled overall. He is currently taking citalopram 20 mg and is happy with his current regimen.  CKD 3 - no change in kidney function.    Hyperlpidemia - currently on Crestor. Tolerating well without any myalgias. He wants to know if you be able to stop it in the future.  GERD - still taking omeprazole daily. Fearful to stop bc of of his long history of GI issues.   Past medical history, Surgical history, Family history not pertinant except as noted below, Social history, Allergies, and medications have been entered into the medical record, reviewed, and corrections made.   Review of Systems: No fevers, chills, night sweats, weight loss, chest pain, or shortness of breath.   Objective:    General: Well Developed, well nourished, and in no acute distress.  Neuro: Alert and oriented x3, extra-ocular muscles intact, sensation grossly intact.  HEENT: Normocephalic, atraumatic  Skin: Warm and dry, no rashes. Cardiac: Regular rate and rhythm, no murmurs rubs or gallops, no lower extremity edema.  Respiratory: Clear to auscultation bilaterally. Not using accessory muscles, speaking in full sentences.   Impression and Recommendations:    COPD - Stable. Continue with AnoroReminded him that he can certainly use his albuterol if needed particularly if he is noticing some chest tightness coughing or wheezing.  Depression - PHQ T score of 2. He is doing very well and is happy with his current regimen.  CKD3 - due to recheck.   Hyperlipidemia - Due to recheck next month. Labs slip provided. Right now he is mostly on a statin just for  his lipids. He did have a minor stroke after a surgical procedure but they felt like it was directly related to the surgery. No prior history of heart disease. For now he will continue with current regimen. Due for repeat lipid check next month.  GERD - with hx of chronic ulcer. Continue PPI for now.

## 2017-03-16 ENCOUNTER — Ambulatory Visit (INDEPENDENT_AMBULATORY_CARE_PROVIDER_SITE_OTHER): Payer: Medicare Other | Admitting: Family Medicine

## 2017-03-16 ENCOUNTER — Encounter: Payer: Self-pay | Admitting: Family Medicine

## 2017-03-16 VITALS — BP 138/83 | HR 77 | Ht 74.0 in | Wt 226.0 lb

## 2017-03-16 DIAGNOSIS — E7849 Other hyperlipidemia: Secondary | ICD-10-CM | POA: Diagnosis not present

## 2017-03-16 DIAGNOSIS — Z125 Encounter for screening for malignant neoplasm of prostate: Secondary | ICD-10-CM | POA: Diagnosis not present

## 2017-03-16 DIAGNOSIS — L821 Other seborrheic keratosis: Secondary | ICD-10-CM | POA: Diagnosis not present

## 2017-03-16 DIAGNOSIS — L814 Other melanin hyperpigmentation: Secondary | ICD-10-CM | POA: Diagnosis not present

## 2017-03-16 DIAGNOSIS — L989 Disorder of the skin and subcutaneous tissue, unspecified: Secondary | ICD-10-CM

## 2017-03-16 DIAGNOSIS — L570 Actinic keratosis: Secondary | ICD-10-CM | POA: Diagnosis not present

## 2017-03-16 DIAGNOSIS — N183 Chronic kidney disease, stage 3 (moderate): Secondary | ICD-10-CM | POA: Diagnosis not present

## 2017-03-16 LAB — LIPID PANEL W/REFLEX DIRECT LDL
Cholesterol: 142 mg/dL (ref <200)
HDL: 40 mg/dL — ABNORMAL LOW (ref >40)
LDL CHOLESTEROL (CALC): 83 mg/dL
NON-HDL CHOLESTEROL (CALC): 102 mg/dL (ref <130)
TRIGLYCERIDES: 99 mg/dL (ref <150)
Total CHOL/HDL Ratio: 3.6 (calc) (ref <5.0)

## 2017-03-16 LAB — COMPLETE METABOLIC PANEL WITH GFR
AG RATIO: 1.5 (calc) (ref 1.0–2.5)
ALT: 15 U/L (ref 9–46)
AST: 18 U/L (ref 10–35)
Albumin: 4.3 g/dL (ref 3.6–5.1)
Alkaline phosphatase (APISO): 99 U/L (ref 40–115)
BUN: 21 mg/dL (ref 7–25)
CALCIUM: 8.8 mg/dL (ref 8.6–10.3)
CO2: 27 mmol/L (ref 20–32)
CREATININE: 1.18 mg/dL (ref 0.70–1.25)
Chloride: 104 mmol/L (ref 98–110)
GFR, EST AFRICAN AMERICAN: 74 mL/min/{1.73_m2} (ref >=60)
GFR, EST NON AFRICAN AMERICAN: 64 mL/min/{1.73_m2} (ref >=60)
GLUCOSE: 98 mg/dL (ref 65–99)
Globulin: 2.8 g/dL (calc) (ref 1.9–3.7)
POTASSIUM: 5 mmol/L (ref 3.5–5.3)
Sodium: 139 mmol/L (ref 135–146)
TOTAL PROTEIN: 7.1 g/dL (ref 6.1–8.1)
Total Bilirubin: 0.5 mg/dL (ref 0.2–1.2)

## 2017-03-16 LAB — PSA: PSA: 1.5 ng/mL (ref <=4.0)

## 2017-03-16 NOTE — Progress Notes (Signed)
   Subjective:    Patient ID: Christopher [name], male    DOB: 1950/10/08, 66 y.o.   MRN: [id number]  HPI 66 year old male is here today to have a then removed from just anterior to his left ear.  It has been there for a while.  Says he thinks is been there for approximately 3 years but had not noticed that it has been getting larger.  Occasionally itchy.  Review of Systems     Objective:   Physical Exam  Constitutional: He is oriented to person, place, and time. He appears well-developed and well-nourished.  HENT:  Head: Normocephalic and atraumatic.    5 x 7 mm round papular skin colored lesion just in front of the ear.  No drainage or open wounds.  The skin is dry but no significant crusting.  Eyes: Conjunctivae and EOM are normal.  Cardiovascular: Normal rate.   Pulmonary/Chest: Effort normal.  Neurological: He is alert and oriented to person, place, and time.  Skin: Skin is dry. No pallor.  Psychiatric: He has a normal mood and affect. His behavior is normal.  Vitals reviewed.       Assessment & Plan:  Lesion left ear -shave biopsy performed.  Patient tolerated well.  Follow-up wound care discussed.  Sent for pathology since in a significantly sun exposed area.  Shave Biopsy Procedure Note  Pre-operative Diagnosis: Suspicious lesion  Post-operative Diagnosis: same  Locations:anterior to the left ear.    Indications: irritation.   Anesthesia: Lidocaine 1% with epinephrine without added sodium bicarbonate  Procedure Details  Patient informed of the risks (including bleeding and infection) and benefits of the  procedure and Verbal informed consent obtained.  The lesion and surrounding area were given a sterile prep using chlorhexidine and draped in the usual sterile fashion. A scalpel was used to shave an area of skin approximately 46mm by 50mm.  Hemostasis achieved with alumuninum chloride. Antibiotic ointment and a sterile dressing applied.  The specimen was sent for  pathologic examination. The patient tolerated the procedure well.  EBL: trace  Findings: Await pathology   Condition: Stable  Complications: none.  Plan: 1. Instructed to keep the wound dry and covered for 24-48h and clean thereafter. 2. Warning signs of infection were reviewed.   3. Recommended that the patient use OTC acetaminophen as needed for pain.  4. Return PRN.

## 2017-03-16 NOTE — Patient Instructions (Addendum)
Keep wound covered until tomorrow.  After that okay to get wet in the shower.  Do not scrub at the area.  Do not apply alcohol, peroxide, iodine or any other products.  Pat dry after shower and apply a thin layer of Vaseline twice a day.  Only really need to keep covered at that point if potential to get dirt in the wound.

## 2017-03-16 NOTE — Addendum Note (Signed)
Addended by: Teddy Spike on: 03/16/2017 12:18 PM   Modules accepted: Orders

## 2017-03-17 NOTE — Progress Notes (Signed)
All labs are normal. 

## 2017-09-06 ENCOUNTER — Other Ambulatory Visit: Payer: Self-pay | Admitting: Family Medicine

## 2017-09-08 ENCOUNTER — Ambulatory Visit: Payer: Medicare Other | Admitting: Family Medicine

## 2017-09-08 ENCOUNTER — Encounter: Payer: Self-pay | Admitting: Family Medicine

## 2017-09-08 VITALS — BP 158/88 | HR 83 | Ht 74.0 in | Wt 242.0 lb

## 2017-09-08 DIAGNOSIS — J410 Simple chronic bronchitis: Secondary | ICD-10-CM | POA: Diagnosis not present

## 2017-09-08 DIAGNOSIS — F419 Anxiety disorder, unspecified: Secondary | ICD-10-CM | POA: Diagnosis not present

## 2017-09-08 DIAGNOSIS — L2389 Allergic contact dermatitis due to other agents: Secondary | ICD-10-CM | POA: Diagnosis not present

## 2017-09-08 DIAGNOSIS — R030 Elevated blood-pressure reading, without diagnosis of hypertension: Secondary | ICD-10-CM

## 2017-09-08 DIAGNOSIS — N183 Chronic kidney disease, stage 3 unspecified: Secondary | ICD-10-CM

## 2017-09-08 LAB — BASIC METABOLIC PANEL WITH GFR
BUN: 24 mg/dL (ref 7–25)
CO2: 28 mmol/L (ref 20–32)
Calcium: 9.5 mg/dL (ref 8.6–10.3)
Chloride: 101 mmol/L (ref 98–110)
Creat: 1.12 mg/dL (ref 0.70–1.25)
GFR, EST NON AFRICAN AMERICAN: 68 mL/min/{1.73_m2} (ref >=60)
GFR, Est African American: 78 mL/min/{1.73_m2} (ref >=60)
Glucose, Bld: 107 mg/dL — ABNORMAL HIGH (ref 65–99)
Potassium: 4.8 mmol/L (ref 3.5–5.3)
SODIUM: 136 mmol/L (ref 135–146)

## 2017-09-08 MED ORDER — TRIAMCINOLONE ACETONIDE 0.5 % EX OINT: 1.0000 "application " | TOPICAL_OINTMENT | Freq: Two times a day (BID) | CUTANEOUS | 0 refills | Status: DC

## 2017-09-08 NOTE — Progress Notes (Signed)
Subjective:    Patient ID: Christopher [name], male    DOB: 11-03-1950, 67 y.o.   MRN: [id number]  HPI Follow-up COPD-he is currently on Anoro and then uses albuterol as needed. CKD 3-last renal check was in October her creatinine was 1.1 at that time.  Follow-up depression/anxiety-overall he is doing well but symptoms just feels a little extra anxious.  He just feels like he worries more as he gets older than he used to in general but does feel like citalopram is working well.  He also complains of a rash on his arms that started about a week ago.  He says the one on his right forearm actually scabbed and then even bled.  But now he is getting some vesicles between his fingers and a couple of patches on his left inner arm.  He also has a small patch on his right hip.  It is itchy but is not currently treating it.  He does not remember getting into any poison ivy but did put an old sweatshirt on that was lying out in his barn.  He also reports that while recently visiting family he checked his blood pressure on a home blood pressure cuff and it was elevated.  He has not had any chest pain or shortness of breath with it.  Review of Systems  BP (!) 158/88   Pulse 83   Ht 6\' 2"  (1.88 m)   Wt 242 lb (109.8 kg)   SpO2 99%   BMI 31.07 kg/m     Allergies  Allergen Reactions  . Lorazepam Other (See Comments)    hallucinations  . Nsaids Other (See Comments)    Hx of chronic recurrent GI ulcer.   . Onion Nausea And Vomiting  . Pravastatin Other (See Comments)    myalgias  . Simvastatin Other (See Comments)    myalgias    Past Medical History:  Diagnosis Date  . Gastric ulcer   . Pancreatitis    11/2010. 2011    Past Surgical History:  Procedure Laterality Date  . abdominal reconstructin     X 3  . CHOLECYSTECTOMY    . ROUX-EN-Y GASTRIC BYPASS      Social History   Socioeconomic History  . Marital status: Divorced    Spouse name: Not on file  . Number of children: Not on file   . Years of education: Not on file  . Highest education level: Not on file  Occupational History  . Not on file  Social Needs  . Financial resource strain: Not on file  . Food insecurity:    Worry: Not on file    Inability: Not on file  . Transportation needs:    Medical: Not on file    Non-medical: Not on file  Tobacco Use  . Smoking status: Former Research scientist (life sciences)  . Smokeless tobacco: Never Used  Substance and Sexual Activity  . Alcohol use: No  . Drug use: No  . Sexual activity: Not on file  Lifestyle  . Physical activity:    Days per week: Not on file    Minutes per session: Not on file  . Stress: Not on file  Relationships  . Social connections:    Talks on phone: Not on file    Gets together: Not on file    Attends religious service: Not on file    Active member of club or organization: Not on file    Attends meetings of clubs or organizations: Not on file  Relationship status: Not on file  . Intimate partner violence:    Fear of current or ex partner: Not on file    Emotionally abused: Not on file    Physically abused: Not on file    Forced sexual activity: Not on file  Other Topics Concern  . Not on file  Social History Narrative  . Not on file    Family History  Problem Relation Age of Onset  . Hypertension Mother   . Cancer Father        prostate, lip cancer  . Heart attack Father   . Cancer Sister   . Cancer Brother     Outpatient Encounter Medications as of 09/08/2017  Medication Sig  . ANORO ELLIPTA 62.5-25 MCG/INH AEPB INHALE 1 PUFF INTO LUNGS ONCE DAILY  . citalopram (CELEXA) 20 MG tablet Take 1 tablet (20 mg total) by mouth daily.  Marland Kitchen omeprazole (PRILOSEC) 20 MG capsule Take 1 capsule (20 mg total) by mouth daily.  . rosuvastatin (CRESTOR) 10 MG tablet Take 1 tablet (10 mg total) by mouth daily.  Marland Kitchen albuterol (PROAIR HFA) 108 (90 Base) MCG/ACT inhaler Inhale 2 puffs into the lungs every 6 (six) hours as needed for wheezing.  . triamcinolone ointment  (KENALOG) 0.5 % Apply 1 application topically 2 (two) times daily.   No facility-administered encounter medications on file as of 09/08/2017.           Objective:   Physical Exam  Constitutional: He is oriented to person, place, and time. He appears well-developed and well-nourished.  HENT:  Head: Normocephalic and atraumatic.  Cardiovascular: Normal rate, regular rhythm and normal heart sounds.  Pulmonary/Chest: Effort normal and breath sounds normal.  Neurological: He is alert and oriented to person, place, and time.  Skin: Skin is warm and dry.  Erythematous excoriated rash on the right anterior arm.  Between the fourth and fifth fingers he has a small pink papular vesicle that is probably about 2-3 mm in size.  He also has a few erythematous lesions on the left upper inner arm.  Psychiatric: He has a normal mood and affect. His behavior is normal.        Assessment & Plan:  COPD -doing really well on Anora.  Continue current regimen.  Follow-up in 6 months.  CKD 3-to recheck renal function.  Depression/anxiety--PHQ 2 was 0.  He is currently on Celexa 20 mg daily.  He will let me know if he feels like we need to bump up his dose a little bit.  Contact dermatitis-we will treat with topical steroid cream.  If not improving of the next week or 2 then please let me know.  Needed blood pressure-blood pressure was really high today we did recheck that came down but still was in the normal range.  This is unusual for him but he says it happened before when he is gained weight and over the winter months he is been very sedentary and has gained about 10-15 pounds.  He says he plans on getting back into increasing his activity and walking more and try to get the weight off in the next couple of months.  I would like for him to come back in a couple weeks for a repeat blood pressure check with a nurse visit.

## 2017-09-09 NOTE — Progress Notes (Signed)
All labs are normal. 

## 2017-09-14 ENCOUNTER — Ambulatory Visit: Payer: Medicare Other | Admitting: Family Medicine

## 2017-11-17 ENCOUNTER — Other Ambulatory Visit: Payer: Self-pay | Admitting: Family Medicine

## 2017-11-17 DIAGNOSIS — E7800 Pure hypercholesterolemia, unspecified: Secondary | ICD-10-CM

## 2018-03-22 ENCOUNTER — Encounter: Payer: Self-pay | Admitting: Family Medicine

## 2018-03-22 ENCOUNTER — Ambulatory Visit: Payer: Medicare Other | Admitting: Family Medicine

## 2018-03-22 VITALS — BP 148/84 | HR 80 | Ht 74.0 in | Wt 246.0 lb

## 2018-03-22 DIAGNOSIS — Z23 Encounter for immunization: Secondary | ICD-10-CM

## 2018-03-22 DIAGNOSIS — R5383 Other fatigue: Secondary | ICD-10-CM | POA: Diagnosis not present

## 2018-03-22 DIAGNOSIS — Z1211 Encounter for screening for malignant neoplasm of colon: Secondary | ICD-10-CM

## 2018-03-22 DIAGNOSIS — N183 Chronic kidney disease, stage 3 unspecified: Secondary | ICD-10-CM

## 2018-03-22 DIAGNOSIS — I10 Essential (primary) hypertension: Secondary | ICD-10-CM

## 2018-03-22 DIAGNOSIS — Z125 Encounter for screening for malignant neoplasm of prostate: Secondary | ICD-10-CM

## 2018-03-22 MED ORDER — LISINOPRIL-HYDROCHLOROTHIAZIDE 10-12.5 MG PO TABS: 1.0000 | ORAL_TABLET | Freq: Every day | ORAL | 1 refills | Status: DC

## 2018-03-22 NOTE — Patient Instructions (Signed)
DASH Eating Plan DASH stands for "Dietary Approaches to Stop Hypertension." The DASH eating plan is a healthy eating plan that has been shown to reduce high blood pressure (hypertension). It may also reduce your risk for type 2 diabetes, heart disease, and stroke. The DASH eating plan may also help with weight loss. What are tips for following this plan? General guidelines  Avoid eating more than 2,300 mg (milligrams) of salt (sodium) a day. If you have hypertension, you may need to reduce your sodium intake to 1,500 mg a day.  Limit alcohol intake to no more than 1 drink a day for nonpregnant women and 2 drinks a day for men. One drink equals 12 oz of beer, 5 oz of wine, or 1 oz of hard liquor.  Work with your health care provider to maintain a healthy body weight or to lose weight. Ask what an ideal weight is for you.  Get at least 30 minutes of exercise that causes your heart to beat faster (aerobic exercise) most days of the week. Activities may include walking, swimming, or biking.  Work with your health care provider or diet and nutrition specialist (dietitian) to adjust your eating plan to your individual calorie needs. Reading food labels  Check food labels for the amount of sodium per serving. Choose foods with less than 5 percent of the Daily Value of sodium. Generally, foods with less than 300 mg of sodium per serving fit into this eating plan.  To find whole grains, look for the word "whole" as the first word in the ingredient list. Shopping  Buy products labeled as "low-sodium" or "no salt added."  Buy fresh foods. Avoid canned foods and premade or frozen meals. Cooking  Avoid adding salt when cooking. Use salt-free seasonings or herbs instead of table salt or sea salt. Check with your health care provider or pharmacist before using salt substitutes.  Do not fry foods. Cook foods using healthy methods such as baking, boiling, grilling, and broiling instead.  Cook with  heart-healthy oils, such as olive, canola, soybean, or sunflower oil. Meal planning   Eat a balanced diet that includes: ? 5 or more servings of fruits and vegetables each day. At each meal, try to fill half of your plate with fruits and vegetables. ? Up to 6-8 servings of whole grains each day. ? Less than 6 oz of lean meat, poultry, or fish each day. A 3-oz serving of meat is about the same size as a deck of cards. One egg equals 1 oz. ? 2 servings of low-fat dairy each day. ? A serving of nuts, seeds, or beans 5 times each week. ? Heart-healthy fats. Healthy fats called Omega-3 fatty acids are found in foods such as flaxseeds and coldwater fish, like sardines, salmon, and mackerel.  Limit how much you eat of the following: ? Canned or prepackaged foods. ? Food that is high in trans fat, such as fried foods. ? Food that is high in saturated fat, such as fatty meat. ? Sweets, desserts, sugary drinks, and other foods with added sugar. ? Full-fat dairy products.  Do not salt foods before eating.  Try to eat at least 2 vegetarian meals each week.  Eat more home-cooked food and less restaurant, buffet, and fast food.  When eating at a restaurant, ask that your food be prepared with less salt or no salt, if possible. What foods are recommended? The items listed may not be a complete list. Talk with your dietitian about what   dietary choices are best for you. Grains Whole-grain or whole-wheat bread. Whole-grain or whole-wheat pasta. Brown rice. Oatmeal. Quinoa. Bulgur. Whole-grain and low-sodium cereals. Pita bread. Low-fat, low-sodium crackers. Whole-wheat flour tortillas. Vegetables Fresh or frozen vegetables (raw, steamed, roasted, or grilled). Low-sodium or reduced-sodium tomato and vegetable juice. Low-sodium or reduced-sodium tomato sauce and tomato paste. Low-sodium or reduced-sodium canned vegetables. Fruits All fresh, dried, or frozen fruit. Canned fruit in natural juice (without  added sugar). Meat and other protein foods Skinless chicken or turkey. Ground chicken or turkey. Pork with fat trimmed off. Fish and seafood. Egg whites. Dried beans, peas, or lentils. Unsalted nuts, nut butters, and seeds. Unsalted canned beans. Lean cuts of beef with fat trimmed off. Low-sodium, lean deli meat. Dairy Low-fat (1%) or fat-free (skim) milk. Fat-free, low-fat, or reduced-fat cheeses. Nonfat, low-sodium ricotta or cottage cheese. Low-fat or nonfat yogurt. Low-fat, low-sodium cheese. Fats and oils Soft margarine without trans fats. Vegetable oil. Low-fat, reduced-fat, or light mayonnaise and salad dressings (reduced-sodium). Canola, safflower, olive, soybean, and sunflower oils. Avocado. Seasoning and other foods Herbs. Spices. Seasoning mixes without salt. Unsalted popcorn and pretzels. Fat-free sweets. What foods are not recommended? The items listed may not be a complete list. Talk with your dietitian about what dietary choices are best for you. Grains Baked goods made with fat, such as croissants, muffins, or some breads. Dry pasta or rice meal packs. Vegetables Creamed or fried vegetables. Vegetables in a cheese sauce. Regular canned vegetables (not low-sodium or reduced-sodium). Regular canned tomato sauce and paste (not low-sodium or reduced-sodium). Regular tomato and vegetable juice (not low-sodium or reduced-sodium). Pickles. Olives. Fruits Canned fruit in a light or heavy syrup. Fried fruit. Fruit in cream or butter sauce. Meat and other protein foods Fatty cuts of meat. Ribs. Fried meat. Bacon. Sausage. Bologna and other processed lunch meats. Salami. Fatback. Hotdogs. Bratwurst. Salted nuts and seeds. Canned beans with added salt. Canned or smoked fish. Whole eggs or egg yolks. Chicken or turkey with skin. Dairy Whole or 2% milk, cream, and half-and-half. Whole or full-fat cream cheese. Whole-fat or sweetened yogurt. Full-fat cheese. Nondairy creamers. Whipped toppings.  Processed cheese and cheese spreads. Fats and oils Butter. Stick margarine. Lard. Shortening. Ghee. Bacon fat. Tropical oils, such as coconut, palm kernel, or palm oil. Seasoning and other foods Salted popcorn and pretzels. Onion salt, garlic salt, seasoned salt, table salt, and sea salt. Worcestershire sauce. Tartar sauce. Barbecue sauce. Teriyaki sauce. Soy sauce, including reduced-sodium. Steak sauce. Canned and packaged gravies. Fish sauce. Oyster sauce. Cocktail sauce. Horseradish that you find on the shelf. Ketchup. Mustard. Meat flavorings and tenderizers. Bouillon cubes. Hot sauce and Tabasco sauce. Premade or packaged marinades. Premade or packaged taco seasonings. Relishes. Regular salad dressings. Where to find more information:  National Heart, Lung, and Blood Institute: www.nhlbi.nih.gov  American Heart Association: www.heart.org Summary  The DASH eating plan is a healthy eating plan that has been shown to reduce high blood pressure (hypertension). It may also reduce your risk for type 2 diabetes, heart disease, and stroke.  With the DASH eating plan, you should limit salt (sodium) intake to 2,300 mg a day. If you have hypertension, you may need to reduce your sodium intake to 1,500 mg a day.  When on the DASH eating plan, aim to eat more fresh fruits and vegetables, whole grains, lean proteins, low-fat dairy, and heart-healthy fats.  Work with your health care provider or diet and nutrition specialist (dietitian) to adjust your eating plan to your individual   calorie needs. This information is not intended to replace advice given to you by your health care provider. Make sure you discuss any questions you have with your health care provider. Document Released: 04/30/2011 Document Revised: 05/04/2016 Document Reviewed: 05/04/2016 Elsevier Interactive Patient Education  2018 Elsevier Inc.  

## 2018-03-22 NOTE — Progress Notes (Addendum)
Subjective:    CC:   HPI: COPD -currently on Anoro and using [employer] as needed.  Doing well overall.  F/U CKD 3 - no recent changes to urination.    Hyper lipidemia-tolerating Crestor well without any side effects or problems.  Elevated blood pressure-he is been checking his blood pressure intermittently over the summer when he goes to the pharmacy and a few times at home.  Is been mostly running in the 140s and 150s.  He denies any current symptoms from his blood pressure.  He does report feeling more fatigued recently.  He says that he does go for about a 15-minute walk every day and then by the time he goes to bed he is just really tired sometimes will even take a nap after the walk.  BP (!) 148/84   Pulse 80   Ht 6\' 2"  (1.88 m)   Wt 246 lb (111.6 kg)   SpO2 98%   BMI 31.58 kg/m     Allergies  Allergen Reactions  . Lorazepam Other (See Comments)    hallucinations  . Nsaids Other (See Comments)    Hx of chronic recurrent GI ulcer.   . Onion Nausea And Vomiting  . Pravastatin Other (See Comments)    myalgias  . Simvastatin Other (See Comments)    myalgias    Past Medical History:  Diagnosis Date  . Gastric ulcer   . Pancreatitis    11/2010. 2011    Past Surgical History:  Procedure Laterality Date  . abdominal reconstructin     X 3  . CHOLECYSTECTOMY    . ROUX-EN-Y GASTRIC BYPASS      Social History   Socioeconomic History  . Marital status: Divorced    Spouse name: Not on file  . Number of children: Not on file  . Years of education: Not on file  . Highest education level: Not on file  Occupational History  . Not on file  Social Needs  . Financial resource strain: Not on file  . Food insecurity:    Worry: Not on file    Inability: Not on file  . Transportation needs:    Medical: Not on file    Non-medical: Not on file  Tobacco Use  . Smoking status: Former Research scientist (life sciences)  . Smokeless tobacco: Never Used  Substance and Sexual Activity  . Alcohol use: No   . Drug use: No  . Sexual activity: Not on file  Lifestyle  . Physical activity:    Days per week: Not on file    Minutes per session: Not on file  . Stress: Not on file  Relationships  . Social connections:    Talks on phone: Not on file    Gets together: Not on file    Attends religious service: Not on file    Active member of club or organization: Not on file    Attends meetings of clubs or organizations: Not on file    Relationship status: Not on file  . Intimate partner violence:    Fear of current or ex partner: Not on file    Emotionally abused: Not on file    Physically abused: Not on file    Forced sexual activity: Not on file  Other Topics Concern  . Not on file  Social History Narrative  . Not on file    Family History  Problem Relation Age of Onset  . Hypertension Mother   . Heart attack Father   . Prostate cancer  Father        prostate, lip cancer  . Uterine cancer Sister   . Cancer Brother   . Colon cancer Sister 26  . Colon cancer Sister 93    Outpatient Encounter Medications as of 03/22/2018  Medication Sig  . ANORO ELLIPTA 62.5-25 MCG/INH AEPB INHALE 1 PUFF INTO LUNGS ONCE DAILY  . citalopram (CELEXA) 20 MG tablet TAKE 1 TABLET BY MOUTH ONCE DAILY  . omeprazole (PRILOSEC) 20 MG capsule TAKE 1 CAPSULE BY MOUTH ONCE DAILY  . rosuvastatin (CRESTOR) 10 MG tablet TAKE 1 TABLET BY MOUTH ONCE DAILY  . albuterol (PROAIR HFA) 108 (90 Base) MCG/ACT inhaler Inhale 2 puffs into the lungs every 6 (six) hours as needed for wheezing.  Marland Kitchen lisinopril-hydrochlorothiazide (PRINZIDE,ZESTORETIC) 10-12.5 MG tablet Take 1 tablet by mouth daily.  . [DISCONTINUED] triamcinolone ointment (KENALOG) 0.5 % Apply 1 application topically 2 (two) times daily.   No facility-administered encounter medications on file as of 03/22/2018.        Objective:    General: Well Developed, well nourished, and in no acute distress.  Neuro: Alert and oriented x3, extra-ocular muscles  intact, sensation grossly intact.  HEENT: Normocephalic, atraumatic  Skin: Warm and dry, no rashes. Cardiac: Regular rate and rhythm, no murmurs rubs or gallops, no lower extremity edema.  Respiratory: Clear to auscultation bilaterally. Not using accessory muscles, speaking in full sentences.   Impression and Recommendations:    COPD -we will.  Continue current regimen.  Hypertension-new diagnosis.  Discussed medication treatment option in addition to the DASH diet.  Additional information provided about the DASH diet.  I would like to recheck his potassium and kidney function 1 to 2 weeks after he starts the medication.  CKD 3-due to recheck renal function.  Due for screenin g PSA -PSA test ordered.  Fatigue-we will check for anemia since he has had a prior history of GI bleed.

## 2018-04-04 DIAGNOSIS — I10 Essential (primary) hypertension: Secondary | ICD-10-CM | POA: Diagnosis not present

## 2018-04-04 DIAGNOSIS — R5383 Other fatigue: Secondary | ICD-10-CM | POA: Diagnosis not present

## 2018-04-04 DIAGNOSIS — Z125 Encounter for screening for malignant neoplasm of prostate: Secondary | ICD-10-CM | POA: Diagnosis not present

## 2018-04-04 DIAGNOSIS — N183 Chronic kidney disease, stage 3 (moderate): Secondary | ICD-10-CM | POA: Diagnosis not present

## 2018-04-05 LAB — PSA: PSA: 1.5 ng/mL (ref <=4.0)

## 2018-04-05 LAB — CBC
HCT: 40.8 % (ref 38.5–50.0)
HEMOGLOBIN: 14.3 g/dL (ref 13.2–17.1)
MCH: 33.5 pg — ABNORMAL HIGH (ref 27.0–33.0)
MCHC: 35 g/dL (ref 32.0–36.0)
MCV: 95.6 fL (ref 80.0–100.0)
MPV: 9 fL (ref 7.5–12.5)
Platelets: 226 10*3/uL (ref 140–400)
RBC: 4.27 10*6/uL (ref 4.20–5.80)
RDW: 11.8 % (ref 11.0–15.0)
WBC: 4.1 10*3/uL (ref 3.8–10.8)

## 2018-04-05 LAB — COMPLETE METABOLIC PANEL WITH GFR
AG RATIO: 1.8 (calc) (ref 1.0–2.5)
ALKALINE PHOSPHATASE (APISO): 110 U/L (ref 40–115)
ALT: 26 U/L (ref 9–46)
AST: 20 U/L (ref 10–35)
Albumin: 4.5 g/dL (ref 3.6–5.1)
BILIRUBIN TOTAL: 0.7 mg/dL (ref 0.2–1.2)
BUN/Creatinine Ratio: 20 (calc) (ref 6–22)
BUN: 25 mg/dL (ref 7–25)
CO2: 29 mmol/L (ref 20–32)
Calcium: 9.5 mg/dL (ref 8.6–10.3)
Chloride: 104 mmol/L (ref 98–110)
Creat: 1.26 mg/dL — ABNORMAL HIGH (ref 0.70–1.25)
GFR, EST AFRICAN AMERICAN: 68 mL/min/{1.73_m2} (ref >=60)
GFR, EST NON AFRICAN AMERICAN: 59 mL/min/{1.73_m2} — AB (ref >=60)
GLUCOSE: 102 mg/dL — AB (ref 65–99)
Globulin: 2.5 g/dL (calc) (ref 1.9–3.7)
POTASSIUM: 5.2 mmol/L (ref 3.5–5.3)
Sodium: 140 mmol/L (ref 135–146)
TOTAL PROTEIN: 7 g/dL (ref 6.1–8.1)

## 2018-04-05 LAB — LIPID PANEL
CHOL/HDL RATIO: 5 (calc) — AB (ref <5.0)
Cholesterol: 181 mg/dL (ref <200)
HDL: 36 mg/dL — ABNORMAL LOW (ref >40)
LDL Cholesterol (Calc): 113 mg/dL (calc) — ABNORMAL HIGH
NON-HDL CHOLESTEROL (CALC): 145 mg/dL — AB (ref <130)
TRIGLYCERIDES: 197 mg/dL — AB (ref <150)

## 2018-04-06 ENCOUNTER — Ambulatory Visit: Payer: Medicare Other | Admitting: Family Medicine

## 2018-04-06 ENCOUNTER — Encounter: Payer: Self-pay | Admitting: Family Medicine

## 2018-04-06 VITALS — BP 136/86 | HR 76 | Ht 72.05 in | Wt 244.0 lb

## 2018-04-06 DIAGNOSIS — I10 Essential (primary) hypertension: Secondary | ICD-10-CM | POA: Diagnosis not present

## 2018-04-06 DIAGNOSIS — J410 Simple chronic bronchitis: Secondary | ICD-10-CM

## 2018-04-06 MED ORDER — LISINOPRIL-HYDROCHLOROTHIAZIDE 20-12.5 MG PO TABS: 1.0000 | ORAL_TABLET | Freq: Every day | ORAL | 1 refills | Status: DC

## 2018-04-06 NOTE — Progress Notes (Signed)
   Subjective:    Patient ID: Christopher [name], male    DOB: [birth year], 67 y.o.   MRN: [id number]  HPI HTN -for follow-up for new diagnosis of hypertension and new start on medication.  He did go for recheck of his renal function a couple of days ago.  Creatinine was right around 1.2 which is pretty close to his baseline though on the upper end of his baseline.  Now on lisinopril HCTZ.  He has checked his blood pressure at home and is been running in the low 140s.  Has noticed that he is urinating a little bit more frequently.  COPD-overall he is actually doing really well on his Anoro.  No recent flares or exacerbations.  Review of Systems     Objective:   Physical Exam  Constitutional: He is oriented to person, place, and time. He appears well-developed and well-nourished.  HENT:  Head: Normocephalic and atraumatic.  Cardiovascular: Normal rate, regular rhythm and normal heart sounds.  Pulmonary/Chest: Effort normal and breath sounds normal.  Neurological: He is alert and oriented to person, place, and time.  Skin: Skin is warm and dry.  Psychiatric: He has a normal mood and affect. His behavior is normal.       Assessment & Plan:  HTN -blood pressure improved but still not quite at goal.  We will go ahead and increase the lisinopril component of his lisinopril HCTZ.  Follow-up in 1 month for nurse blood pressure check.  COPD  - stable.

## 2018-04-13 ENCOUNTER — Encounter: Payer: Self-pay | Admitting: Family Medicine

## 2018-05-09 ENCOUNTER — Telehealth: Payer: Self-pay | Admitting: Gastroenterology

## 2018-05-09 NOTE — Telephone Encounter (Signed)
Received previous GI records placed on Dr. Donneta Romberg desk for review.

## 2018-05-12 ENCOUNTER — Ambulatory Visit (INDEPENDENT_AMBULATORY_CARE_PROVIDER_SITE_OTHER): Payer: Medicare Other | Admitting: Family Medicine

## 2018-05-12 VITALS — BP 148/80 | HR 83 | Ht 72.05 in | Wt 244.0 lb

## 2018-05-12 DIAGNOSIS — I10 Essential (primary) hypertension: Secondary | ICD-10-CM

## 2018-05-12 NOTE — Progress Notes (Signed)
Patient is here today for a blood pressure check. Patient is currently taking Lisinopril-Hydrochlorothiazide 20-12.5mg . Patient states when he was recently traveling to [place], he went hiking and during his hiking trip he began to feel dizzy. He was wondering if it could be due to the recent blood pressure medication increase. Patient's blood pressure today was 156/93. I allowed patient to sit for about 10 minutes and rechecked the blood pressure manually. The manual blood pressure reading was 148/80. Provider would like for patient to return to clinic the day or two after Christmas to recheck blood pressure. Patient will also decrease blood pressure medication to 10 mg until next nurse visit due to the 20 mg making him feel dizzy. Patient verbalized understanding, and nurse visit has been scheduled.

## 2018-05-12 NOTE — Progress Notes (Signed)
Agree with documentation as above.   Mitul Hallowell, MD  

## 2018-05-24 ENCOUNTER — Ambulatory Visit (INDEPENDENT_AMBULATORY_CARE_PROVIDER_SITE_OTHER): Payer: Medicare Other | Admitting: Family Medicine

## 2018-05-24 VITALS — BP 124/81 | HR 77 | Temp 97.7°F | Wt 243.0 lb

## 2018-05-24 DIAGNOSIS — I10 Essential (primary) hypertension: Secondary | ICD-10-CM | POA: Diagnosis not present

## 2018-05-24 MED ORDER — LISINOPRIL-HYDROCHLOROTHIAZIDE 10-12.5 MG PO TABS: 1.0000 | ORAL_TABLET | Freq: Every day | ORAL | 0 refills | Status: DC

## 2018-05-24 NOTE — Progress Notes (Signed)
Patient in today for BP check. Patient had decreased medication to Lisinopril-HCTZ 10-12.5 mg.  Pt denies headaches, blurred vision, chest pain and shortness of breath and decreased dizziness on this lower dose.BP today in office was 124/81 and pulse 77.  Provider will be notified . If any changes Pt will be called and advised. Pt needs a refill sent to pharmacy.

## 2018-05-24 NOTE — Progress Notes (Signed)
Agree with documentation as above. Med sent.   Beatrice Lecher, MD

## 2018-05-27 NOTE — Telephone Encounter (Signed)
Records reviewed and ok to schedule Direct for Ca Screening. If he is having any other issues he will need an office visit first.  I called and left him a voicemail to call back and schedule.

## 2018-05-30 ENCOUNTER — Encounter: Payer: Self-pay | Admitting: Gastroenterology

## 2018-06-17 ENCOUNTER — Encounter: Payer: Self-pay | Admitting: Gastroenterology

## 2018-06-17 ENCOUNTER — Ambulatory Visit (AMBULATORY_SURGERY_CENTER): Payer: Self-pay

## 2018-06-17 VITALS — Ht 73.0 in | Wt 243.0 lb

## 2018-06-17 DIAGNOSIS — Z1211 Encounter for screening for malignant neoplasm of colon: Secondary | ICD-10-CM

## 2018-06-17 MED ORDER — PEG 3350-KCL-NA BICARB-NACL 420 G PO SOLR: 4000.0000 mL | Freq: Once | ORAL | 0 refills | Status: AC

## 2018-06-17 NOTE — Progress Notes (Signed)
Denies allergies to eggs or soy products. Denies complication of anesthesia or sedation. Denies use of weight loss medication. Denies use of O2.   Emmi instructions declined.    Patient is a direct screening colonoscopy. In Pre-Visit I noticed his very complicated surgical/ medical history. Patient was wondering if a colonoscopy was even possible. The patients daughter is with [facility] and she had many medical questions regarding her fathers health. I suggested that we cancel his colonoscopy and make an appointment with Dr. Rush Landmark in the office prior to a procedure. The patient and his family thought that it would be a great idea to schedule the OV. Procedure will be cancelled and OV scheduled per patients request. Dantre has two sister's that have colon cancer.

## 2018-07-01 ENCOUNTER — Encounter: Payer: Medicare Other | Admitting: Gastroenterology

## 2018-07-12 ENCOUNTER — Other Ambulatory Visit: Payer: Self-pay | Admitting: Gastroenterology

## 2018-07-12 ENCOUNTER — Ambulatory Visit: Payer: Medicare Other | Admitting: Gastroenterology

## 2018-07-12 ENCOUNTER — Encounter: Payer: Self-pay | Admitting: Gastroenterology

## 2018-07-12 VITALS — BP 132/82 | HR 78 | Ht 73.0 in | Wt 245.0 lb

## 2018-07-12 DIAGNOSIS — Z9889 Other specified postprocedural states: Secondary | ICD-10-CM

## 2018-07-12 DIAGNOSIS — K862 Cyst of pancreas: Secondary | ICD-10-CM

## 2018-07-12 DIAGNOSIS — Z800 Family history of malignant neoplasm of digestive organs: Secondary | ICD-10-CM | POA: Diagnosis not present

## 2018-07-12 DIAGNOSIS — Z9189 Other specified personal risk factors, not elsewhere classified: Secondary | ICD-10-CM | POA: Diagnosis not present

## 2018-07-12 MED ORDER — SUPREP BOWEL PREP KIT 17.5-3.13-1.6 GM/177ML PO SOLN: 1.0000 | ORAL | 0 refills | Status: DC

## 2018-07-12 NOTE — Patient Instructions (Signed)
If you are age 69 or older, your body mass index should be between 23-30. Your Body mass index is 32.32 kg/m. If this is out of the aforementioned range listed, please consider follow up with your Primary Care Provider.   You have been scheduled for a colonoscopy. Please follow written instructions given to you at your visit today.  Please pick up your prep supplies at the pharmacy within the next 1-3 days. If you use inhalers (even only as needed), please bring them with you on the day of your procedure. Your physician has requested that you go to www.startemmi.com and enter the access code given to you at your visit today. This web site gives a general overview about your procedure. However, you should still follow specific instructions given to you by our office regarding your preparation for the procedure.   You have been scheduled for an MRI/MRCPI at [facility] on 07/15/2018. Your appointment time is 9:00am. Please arrive 30 minutes prior to your appointment time for registration purposes. Please make certain not to have anything to eat or drink 4 hours prior to your test. In addition, if you have any metal in your body, have a pacemaker or defibrillator, please be sure to let your ordering physician know. This test typically takes 45 minutes to 1 hour to complete. Should you need to reschedule, please call [phone] to do so.  Thank you for choosing me and [facility].  Dr. Rush Landmark

## 2018-07-12 NOTE — Progress Notes (Signed)
[place] VISIT   Primary Care Provider [name], [facility] [place] [address] [phone]  Referring Provider [name], [place], [place] [address] [phone]  Patient Profile: Christopher [name] is a 68 y.o. male with a pmh significant for HTN, HLD, GU s/p Distal Gastrectomy with dehiscence and subsequent Re-do GJ and Biliary Bypass and Cholecystectomy, hypertension, hyperlipidemia, recurrent pancreatitis, CVA, COPD, anxiety, pancreatic cystic disease, family history colon cancer (3 sisters and one under the age of 51).  The patient presents to the [facility] for an evaluation and management of problem(s) noted below:  Problem List 1. High risk for colon cancer   2. Family hx of colon cancer   3. Pancreatic cyst   4. S/P biliary surgery     History of Present Illness: This is the patient's first visit to the outpatient [place] clinic.  The patient was initially scheduled for a direct procedure colonoscopy for colon cancer screening purposes.  However due to his significant prior surgical history as noted below the patient and family wanted to meet and discuss the role of colon cancer screening via colonoscopy and also described other issues.  Back in 2011 the patient had significant issues as a result of a perforated gastric ulcer.  He eventually developed gastric outlet obstruction.  He required TPN.  Eventually had a gastrojejunostomy for bypass performed.  This failed.  He developed significant issues with sepsis requiring transfer to [place].  In [place] he underwent the surgery as noted below.  But in brief had a reconstruction of a retrocolic gastrojejunostomy as well as a redo reconstruction of a Roux-en-Y hepaticojejunostomy with cholecystectomy and bile duct exploration as well as a Phillip Heal patch of a pancreas leak.  After months of healing he  eventually improved and had no further drains.  At some point he was found to have some cystic lesions of the pancreas and was being followed for these by GI back in [place].  He has not had any follow-up in years.  The patient has had a history of acid reflux and abdominal pains however has done exceedingly well and no longer has issues at this point in time.  He has noted hemorrhoids in the past with very infrequent bleeding on the toilet paper.  The patient has an extensive family history of colon cancer in 3 of his sisters.  His last colonoscopy was in 2009 and was done outside of the area and was reportedly normal with plan for a 10-year follow-up which would be due now.  The patient does not take any nonsteroidals or BC Goody powders.  He has had a prior upper endoscopy years ago.  GI Review of Systems Positive as above Negative for dysphagia, odynophagia, nausea, vomiting, change in bowel habits, melena, hematochezia  Review of Systems General: Denies fevers/chills HEENT: Denies oral lesions Cardiovascular: Denies chest pain/palpitations Pulmonary: Denies shortness of breath/nocturnal cough Gastroenterological: See HPI Genitourinary: Denies darkened urine Hematological: Denies easy bruising/bleeding Endocrine: Denies temperature intolerance Dermatological: Denies jaundice Psychological: Mood is stable   Medications Current Outpatient Medications  Medication Sig Dispense Refill  . ANORO ELLIPTA 62.5-25 MCG/INH AEPB INHALE 1 PUFF INTO LUNGS ONCE DAILY 180 each 3  . citalopram (CELEXA) 20 MG tablet TAKE 1 TABLET BY MOUTH ONCE DAILY 90 tablet 3  . lisinopril-hydrochlorothiazide (PRINZIDE,ZESTORETIC) 10-12.5 MG tablet Take 1 tablet by mouth daily. 90 tablet 0  . Multiple Vitamin (MULTIVITAMIN)  tablet Take 1 tablet by mouth daily.    Marland Kitchen omeprazole (PRILOSEC) 20 MG capsule TAKE 1 CAPSULE BY MOUTH ONCE DAILY 90 capsule 3  . rosuvastatin (CRESTOR) 10 MG tablet TAKE 1 TABLET BY MOUTH  ONCE DAILY 90 tablet 3  . albuterol (PROAIR HFA) 108 (90 Base) MCG/ACT inhaler Inhale 2 puffs into the lungs every 6 (six) hours as needed for wheezing. 1 Inhaler prn  . SUPREP BOWEL PREP KIT 17.5-3.13-1.6 GM/177ML SOLN Take 1 kit by mouth as directed. For colonoscopy prep 2 Bottle 0   No current facility-administered medications for this visit.     Allergies Allergies  Allergen Reactions  . Lorazepam Other (See Comments)    hallucinations  . Nsaids Other (See Comments)    Hx of chronic recurrent GI ulcer.   . Onion Nausea And Vomiting  . Pravastatin Other (See Comments)    myalgias  . Simvastatin Other (See Comments)    myalgias    Histories Past Medical History:  Diagnosis Date  . Anxiety   . Blood transfusion without reported diagnosis   . Clotting disorder ([place])   . COPD (chronic obstructive pulmonary disease) ([place])   . Depression   . Gastric ulcer   . GERD (gastroesophageal reflux disease)   . Hyperlipidemia   . Hypertension   . Pancreatitis    11/2010. 2011  . Stroke [facility])    Past Surgical History:  Procedure Laterality Date  . abdominal reconstructin     X 3  . BILE DUCT EXPLORATION    . CHOLECYSTECTOMY    . Duodenum Stricture    . Explorratory Laparotomy    . GASTRECTOMY    . GASTROJEJUNOSTOMY    . LAPAROSCOPIC LYSIS OF ADHESIONS    . Pancreatic leak repair    . Resection of distal stomach and proximal Roux limb    . ROUX-EN-Y GASTRIC BYPASS     Social History   Socioeconomic History  . Marital status: Divorced    Spouse name: Not on file  . Number of children: Not on file  . Years of education: Not on file  . Highest education level: Not on file  Occupational History  . Not on file  Social Needs  . Financial resource strain: Not on file  . Food insecurity:    Worry: Not on file    Inability: Not on file  . Transportation needs:    Medical: Not on file    Non-medical: Not on file  Tobacco Use  . Smoking status: Former Research scientist (life sciences)  . Smokeless  tobacco: Current User  . Tobacco comment: Quit 15 years ago  Substance and Sexual Activity  . Alcohol use: Yes    Comment: Once a week mixed drink   . Drug use: No  . Sexual activity: Not on file  Lifestyle  . Physical activity:    Days per week: Not on file    Minutes per session: Not on file  . Stress: Not on file  Relationships  . Social connections:    Talks on phone: Not on file    Gets together: Not on file    Attends religious service: Not on file    Active member of club or organization: Not on file    Attends meetings of clubs or organizations: Not on file    Relationship status: Not on file  . Intimate partner violence:    Fear of current or ex partner: Not on file    Emotionally abused: Not on file  Physically abused: Not on file    Forced sexual activity: Not on file  Other Topics Concern  . Not on file  Social History Narrative  . Not on file   Family History  Problem Relation Age of Onset  . Hypertension Mother   . Heart attack Father   . Prostate cancer Father        prostate, lip cancer  . Uterine cancer Sister   . Lung cancer Brother   . Colon cancer Sister 49  . Colon cancer Sister 30  . Ovarian cancer Sister   . Esophageal cancer Neg Hx   . Stomach cancer Neg Hx   . Rectal cancer Neg Hx   . Inflammatory bowel disease Neg Hx   . Liver disease Neg Hx   . Pancreatic cancer Neg Hx    I have reviewed his medical, social, and family history in detail and updated the electronic medical record as necessary.    PHYSICAL EXAMINATION  BP 132/82   Pulse 78   Ht '6\' 1"'  (1.854 m)   Wt 245 lb (111.1 kg)   BMI 32.32 kg/m  Wt Readings from Last 3 Encounters:  07/12/18 245 lb (111.1 kg)  06/17/18 243 lb (110.2 kg)  05/24/18 243 lb (110.2 kg)  GEN: NAD, appears stated age, doesn't appear chronically ill, accompanied by daughter PSYCH: Cooperative, without pressured speech EYE: Conjunctivae pink, sclerae anicteric ENT: MMM, without oral ulcers, no  erythema or exudates noted NECK: Supple CV: RR without R/Gs  RESP: CTAB posteriorly, without wheezing GI: NABS, soft, midline surgical scar and incisional hernia present, NT, without rebound or guarding, no HSM appreciated MSK/EXT: No lower extremity edema SKIN: No jaundice NEURO:  Alert & Oriented x 3, no focal deficits   REVIEW OF DATA  I reviewed the following data at the time of this encounter:  GI Procedures and Studies  Reported 2009 colonoscopy outside Reportedly normal with plan for 10-year follow-up (we do not have that record  Laboratory Studies  Reviewed in epic and care everywhere  Imaging Studies  2015 MRI/MRCP Decrease conspicuity of the 2 to 3 mm T2 hyperintense lesion in the head of the pancreas. There is a persistent channel to the main hepatic duct, however, the lesion is not well visualized. 2 additional tiny 2 to 3 mm lesions identified in the pancreatic body and tail, with the lesion in the pancreatic body demonstrating a channel to the main pancreatic duct. ADDITIONAL FINDINGS: Lower Chest: . Heart: Normal size. No pericardial effusion. . Lungs/Pleura: No masses, consolidations, or effusions.  . Additional: None. Abdomen: . Liver: Normal size without focal lesions. . Gallbladder/Biliary: Cholecystectomy. No intra or extrahepatic biliary ductal dilatation. Marland Kitchen Spleen: Unchanged nonspecific T2 hyperintense lesion within the inferior spleen. . Pancreas: Please see problem specific findings. . Adrenals: No focal lesion. . Kidneys: No concerning renal masses. No hydronephrosis. Multiple T2 hyperintense lesions in the right kidney, the largest in the interpolar region measuring 10 mm, compatible with renal cysts and unchanged compared to prior. . Stomach/bowel: Post surgical changes of distal gastrectomy and Roux-en-Y gastric bypass. Please see surgical history as above. . Peritoneum: No free fluid. . Nodes: No celiac or mesenteric adenopathy. .  Musculoskeletal: No fracture or destructive lesions. .  Additional comments: None  2013 MRI/MRCP 1. Complex postsurgical changes, including distal gastrectomy and proximal duodenectomy with choledochoduodenostomy and gastrojejunostomy, and choledochojejunostomy at the level of the proximal common duct. 2. Unchanged prominent main pancreatic duct at the level of the pancreatic head  and body. The choledochoduodenostomy is not well evaluated on this examination. In addition, the mid and distal aspect of the common bile duct, which is located distal to the  choledochojejunostomy, is mildly dilated (diameter 9 mm), exhibits mild wall thickening, and is filled with fluid. These findings raise the concern for retention of fluid within the mid and distal aspect of the common bile duct with possible  cholangitis. Correlation with symptomatology is recommended. 3. A 4 mm T2 hyperintense lesion is visualized within the pancreatic head, which communicates with the main pancreatic duct. This finding is favored to represent a small side branch intraductal papillary mucinous neoplasm (IPMN). One year follow up  examination is recommended.  Outside Operative Record Summary  The full operative note will be scanned into the chart Oct 15, 2009 op note Preop diagnosis Biliary/pancreatic leak after failed upper GI reconstruction Gastric outlet obstruction in the gastrojejunal outflow tract  Postoperative diagnoses Leak in the gallbladder anastomosis Leak from gastrojejunostomy Pancreas head leak Strictured duodenum at D1/D2 leading to discontinuity between stomach and duodenum Strictured Roux-en-Y jejunostomy under the transverse colon  Procedures performed Ex lap Lysis of adhesions Resection of distal stomach and proximal Roux limb Reconstruction with retrocolic proximal gastrojejunostomy 8 cm past the ligament of Treitz with an EF parent limb of 30 cm Redo reconstruction with Roux-en-Y  hepaticojejunostomy 60 cm length Intraoperative ultrasound Cholecystectomy Bile duct exploration Graham patch repair of pancreas leak  The entirety of the procedure note will be in the chart   ASSESSMENT  Christopher [name] is a 68 y.o. male with a pmh significant for HTN, HLD, GU s/p Distal Gastrectomy with dehiscence and subsequent Re-do GJ and Biliary Bypass and Cholecystectomy, hypertension, hyperlipidemia, recurrent pancreatitis, CVA, COPD, anxiety, pancreatic cystic disease, family history colon cancer (3 sisters and one under the age of 60).  The patient is seen today for evaluation and management of:  1. High risk for colon cancer   2. Family hx of colon cancer   3. Pancreatic cyst   4. S/P biliary surgery    This is a clinically and hemodynamically stable patient.  He has a very complicated biliary history but at this point in time has done exceedingly well since his last surgeries years ago.  He is not also had any recurrent pancreatitis and the etiology of that remains unclear though the fatty have his previous issues with his bile duct and gallbladder I suspect there were issues that surrounded that.  At this point in time there are not significant upper GI symptoms that I think require an upper endoscopy as a diagnostic procedure.  He does however require a colonoscopy for colon cancer screening and we will move forward with having that completed in the coming weeks.  Even if he has no polyps based on his high risk with 3 family members who have colon cancer were first-degree relatives he should be on a 5-year plan no matter what.  The patient also has a history of previously noted possible IPMN versus pancreatic cystic lesions.  These were last followed 5 years ago.  We will obtain an MRI/MRCP to evaluate the pancreas and if there are no significant changes in his cystic lesions compared to his 2015 MRI/MRCP then I do not think we need to continue surveillance at a significant rate if there was  no change over 5 years.  We discussed briefly why pancreatic cysts and in particular IPMNs are followed because of the small risk of development of pancreas cancer but  this is implicated mostly in patient to have main duct or mixed IPMN that are rapidly changing.  We will see what his MRI shows.  The risks and benefits of endoscopic evaluation were discussed with the patient; these include but are not limited to the risk of perforation, infection, bleeding, missed lesions, lack of diagnosis, severe illness requiring hospitalization, as well as anesthesia and sedation related illnesses.  The patient is agreeable to proceed.  All patient questions were answered, to the best of my ability, and the patient agrees to the aforementioned plan of action with follow-up as indicated.   PLAN  Proceed with scheduling colonoscopy for screening purposes and he will remain a high risk screening are moving forward Proceed with scheduling MRI/MRCP to evaluate pancreatic cystic lesions Try to obtain MRI/MRCP from [facility] to have uploaded into our system so that it can be reviewed and compared with the new MRI that is performed   Orders Placed This Encounter  Procedures  . MR ABDOMEN MRCP W WO CONTAST  . Ambulatory referral to Gastroenterology    New Prescriptions   SUPREP BOWEL PREP KIT 17.5-3.13-1.6 GM/177ML SOLN    Take 1 kit by mouth as directed. For colonoscopy prep   Modified Medications   No medications on file    Planned Follow Up: No follow-ups on file.   Justice Britain, MD [facility] Office # [phone]

## 2018-07-12 NOTE — Telephone Encounter (Signed)
Message left with patient that hey may come to our office to pick up a sample of Supep.

## 2018-07-13 ENCOUNTER — Telehealth: Payer: Self-pay | Admitting: Gastroenterology

## 2018-07-13 NOTE — Telephone Encounter (Signed)
Pt's daughter Larene Beach called to inform that she was not able to find the cd of pt's previous MRI. She would like to speak with you.

## 2018-07-14 DIAGNOSIS — Z9889 Other specified postprocedural states: Secondary | ICD-10-CM | POA: Insufficient documentation

## 2018-07-14 DIAGNOSIS — K862 Cyst of pancreas: Secondary | ICD-10-CM | POA: Insufficient documentation

## 2018-07-14 DIAGNOSIS — Z9189 Other specified personal risk factors, not elsewhere classified: Secondary | ICD-10-CM | POA: Insufficient documentation

## 2018-07-14 DIAGNOSIS — Z800 Family history of malignant neoplasm of digestive organs: Secondary | ICD-10-CM | POA: Insufficient documentation

## 2018-07-14 NOTE — Telephone Encounter (Signed)
Left message for Christopher [name], asking for return call.

## 2018-07-15 ENCOUNTER — Ambulatory Visit (HOSPITAL_COMMUNITY)
Admission: RE | Admit: 2018-07-15 | Discharge: 2018-07-15 | Disposition: A | Discharge status: Home / Self Care | Payer: Medicare Other | Source: Ambulatory Visit | Attending: Gastroenterology | Admitting: Gastroenterology

## 2018-07-15 ENCOUNTER — Other Ambulatory Visit: Payer: Self-pay | Admitting: Gastroenterology

## 2018-07-15 DIAGNOSIS — Z800 Family history of malignant neoplasm of digestive organs: Secondary | ICD-10-CM | POA: Insufficient documentation

## 2018-07-15 DIAGNOSIS — K862 Cyst of pancreas: Secondary | ICD-10-CM

## 2018-07-15 DIAGNOSIS — R935 Abnormal findings on diagnostic imaging of other abdominal regions, including retroperitoneum: Secondary | ICD-10-CM | POA: Diagnosis not present

## 2018-07-15 LAB — POCT I-STAT CREATININE: Creatinine, Ser: 1.4 mg/dL — ABNORMAL HIGH (ref 0.61–1.24)

## 2018-07-15 MED ORDER — GADOBUTROL 1 MMOL/ML IV SOLN
10.0000 mL | Freq: Once | INTRAVENOUS | Status: AC | PRN
  Administered 2018-07-15: 10 mL via INTRAVENOUS

## 2018-07-15 NOTE — Telephone Encounter (Signed)
Spoke with patients daughter , she informed me that she does not have disc of MRI. I have faxed record release to [facility] requesting disc be sent to Dr.Mansouraty.

## 2018-07-18 ENCOUNTER — Encounter: Payer: Self-pay | Admitting: Family Medicine

## 2018-07-18 DIAGNOSIS — I719 Aortic aneurysm of unspecified site, without rupture: Secondary | ICD-10-CM | POA: Insufficient documentation

## 2018-07-19 ENCOUNTER — Telehealth: Payer: Self-pay | Admitting: Gastroenterology

## 2018-07-19 NOTE — Telephone Encounter (Signed)
PT daughter states she is returning your call from yesterday about her dad.

## 2018-07-19 NOTE — Telephone Encounter (Signed)
Returned call to [place], left another message.

## 2018-08-16 ENCOUNTER — Encounter: Payer: Medicare Other | Admitting: Gastroenterology

## 2018-08-24 ENCOUNTER — Ambulatory Visit: Payer: Medicare Other

## 2018-08-24 ENCOUNTER — Other Ambulatory Visit: Payer: Self-pay | Admitting: Family Medicine

## 2018-09-09 ENCOUNTER — Encounter: Payer: Medicare Other | Admitting: Gastroenterology

## 2018-09-25 NOTE — Progress Notes (Signed)
Subjective:   Christopher [name] is a 68 y.o. male who presents for an Initial Medicare Annual Wellness Visit.  Review of Systems  No ROS.  Medicare Wellness Virtual Visit. UTA vital signs.  Additional risk factors are reflected in the social history.    Cardiac Risk Factors include: advanced age (>70mn, >>5women);male gender;hypertension  Sleep patterns: Getting 6 hours of sleep at night. Wakes up 1 time a night to go to the bathroom. Wakes up feeling rested. Does take a nap during the day Home Safety/Smoke Alarms: Feels safe in home. Smoke alarms in place.  Living environment; Lives with daughter and grandkids in a 2 story home. Stairs have handrails on them. SHower is a step over tub and no grab bars in place. Seat Belt Safety/Bike Helmet: Wears seat belt.  Male:   CCS-  [place] have done at end of [place]  PSA-  UTD Lab Results  Component Value Date   PSA 1.5 04/04/2018   PSA 1.5 03/16/2017       Objective:    Today's Vitals   09/26/18 0927  BP: 128/82  Weight: 245 lb (111.1 kg)  Height: _0  (1.88 m)   Body mass index is 31.46 kg/m.  Advanced Directives 09/26/2018  Does Patient Have a Medical Advance Directive? Yes  Type of [place]  Does patient want to make changes to medical advance directive? No - Patient declined  Copy of HCedaredgein Chart? No - copy requested    Current Medications (verified) Outpatient Encounter Medications as of 09/26/2018  Medication Sig  . ANORO ELLIPTA 62.5-25 MCG/INH AEPB INHALE 1 PUFF INTO LUNGS ONCE DAILY  . citalopram (CELEXA) 20 MG tablet TAKE 1 TABLET BY MOUTH ONCE DAILY  . lisinopril-hydrochlorothiazide (PRINZIDE,ZESTORETIC) 10-12.5 MG tablet Take 1 tablet by mouth once daily  . Multiple Vitamin (MULTIVITAMIN) tablet Take 1 tablet by mouth daily.  .Marland Kitchenomeprazole (PRILOSEC) 20 MG capsule TAKE 1 CAPSULE BY MOUTH ONCE DAILY  . rosuvastatin  (CRESTOR) 10 MG tablet TAKE 1 TABLET BY MOUTH ONCE DAILY  . albuterol (PROAIR HFA) 108 (90 Base) MCG/ACT inhaler Inhale 2 puffs into the lungs every 6 (six) hours as needed for wheezing.  .Marland KitchenSUPREP BOWEL PREP KIT 17.5-3.13-1.6 GM/177ML SOLN Take 1 kit by mouth as directed. For colonoscopy prep (Patient not taking: Reported on 09/26/2018)   No facility-administered encounter medications on file as of 09/26/2018.     Allergies (verified) Lorazepam; Nsaids; Onion; Pravastatin; and Simvastatin   History: Past Medical History:  Diagnosis Date  . Anxiety   . Blood transfusion without reported diagnosis   . [place]   . COPD [place]ronic obstructive [place] disease) ([place]   . Depression   . Gastric ulcer   . GERD (gastroesophageal reflux disease)   . Hyperlipidemia   . Hypertension   . Pancreatitis    11/2010. 2011  . Stroke ([facility]    Past Surgical History:  Procedure Laterality Date  . abdominal reconstructin     X 3  . BILE DUCT EXPLORATION    . CHOLECYSTECTOMY    . Duodenum Stricture    . Explorratory Laparotomy    . GASTRECTOMY    . GASTROJEJUNOSTOMY    . LAPAROSCOPIC LYSIS OF ADHESIONS    . Pancreatic leak repair    . Resection of distal stomach and proximal Roux limb    . ROUX-EN-Y GASTRIC BYPASS     Family History  Problem Relation Age of Onset  . Hypertension Mother   . Heart attack Father   . Prostate cancer Father        prostate, lip cancer  . Uterine cancer Sister   . Lung cancer Brother   . Colon cancer Sister 85  . Colon cancer Sister 25  . Ovarian cancer Sister   . Esophageal cancer Neg Hx   . Stomach cancer Neg Hx   . Rectal cancer Neg Hx   . Inflammatory bowel disease Neg Hx   . Liver disease Neg Hx   . Pancreatic cancer Neg Hx    Social History   Socioeconomic History  . Marital status: Divorced    Spouse name: Not on file  . Number of children: 4  . Years of education: 1  . Highest education level: 12th grade  Occupational History   . 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 Financial resource strain: Not hard at all  . Food insecurity:    Worry: Never true    Inability: Never true  . Transportation needs:    Medical: No    Non-medical: No  Tobacco Use  . Smoking status: Former Research scientist (life sciences)  . Smokeless tobacco: Current User    Types: Chew  . Tobacco comment: Quit 15 years ago  Substance and Sexual Activity  . Alcohol use: Yes    Comment: Once a week mixed drink   . Drug use: No  . Sexual activity: Not Currently  Lifestyle  . Physical activity:    Days per week: 5 days    Minutes per session: 10 min  . Stress: Not at all  Relationships  . Social connections:    Talks on phone: Three times a week    Gets together: Three times a week    Attends religious service: Never    Active member of club or organization: No    Attends meetings of clubs or organizations: Never    Relationship status: Divorced  Other Topics Concern  . Not on file  Social History Narrative   Getting ready to go to [place] for the summer.   Lives with daughter and grandkids   Tobacco Counseling Ready to quit: Not Answered Counseling given: No Comment: Quit 15 years ago   Clinical Intake:  Pre-visit preparation completed: Yes  Pain : No/denies pain     Nutritional Risks: None Diabetes: No  How often do you need to have someone help you when you read instructions, pamphlets, or other written materials from your doctor or pharmacy?: 1 - Never What is the last grade level you completed in school?: 12  Interpreter Needed?: No  Information entered by :: Orlie Dakin, LPN  Activities of Daily Living In your present state of health, do you have any difficulty performing the following activities: 09/26/2018  Hearing? Y  Comment has n oticed some hearing loss and will get checked at end of the year  Vision? N  Difficulty concentrating or making decisions? N  Walking or climbing stairs? N  Dressing or  bathing? N  Doing errands, shopping? N  Preparing Food and eating ? N  Using the Toilet? N  In the past six months, have you accidently leaked urine? N  Do you have problems with loss of bowel control? N  Managing your Medications? N  Managing your Finances? N  Housekeeping or managing your Housekeeping? N  Some recent data might be hidden     Immunizations and  Health Maintenance Immunization History  Administered Date(s) Administered  . Influenza Split 02/23/2012  . Influenza Whole 03/05/2010, 01/16/2011  . Influenza, High Dose Seasonal PF 03/17/2016, 03/02/2017, 03/22/2018  . Influenza,inj,Quad PF,6+ Mos 01/12/2014, 02/04/2015  . Influenza-Unspecified 04/10/2013  . Pneumococcal Conjugate-13 06/13/2015  . Pneumococcal Polysaccharide-23 04/01/2010, 09/01/2016  . Tdap 01/16/2011  . Zoster 01/01/2011   There are no preventive care reminders to display for this patient.  Patient Care Team: [name], MD as PCP - General Dr Carlis Abbott (Gastroenterology)  Indicate any recent Medical Services you may have received from other than [facility] providers in the past year (date may be approximate).    Assessment:   This is a routine wellness examination for Valente.Physical assessment deferred to PCP.   Hearing/Vision screen Hearing Screening Comments: Hearing test not done- visit done over the telephone due to the [place] pandemic Vision Screening Comments: Vision test not done- visit done over the telephone due to [place] pandemic  Dietary issues and exercise activities discussed: Current Exercise Habits: Home exercise routine, Type of exercise: walking, Time (Minutes): 10, Frequency (Times/Week): 5, Weekly Exercise (Minutes/Week): 50, Intensity: Mild, Exercise limited by: None identified Diet Right now eats healthy but eats bigger portions since being inside from the COVID pandemic Breakfast: oatmeal and toast Lunch: leftovers Dinner:  Meat and vegetables and potatoes. Eats a  banana everyday. Uses lemon in every drink. Drinks 4-5 16 ounce bottle a day.Eats cheese for dairy does not like milk.      Goals    . Patient Stated     Patient states would like to eat a healthier diet and smaller portions.      Depression Screen PHQ 2/9 Scores 09/26/2018 09/08/2017 03/02/2017 09/01/2016  PHQ - 2 Score 0 0 0 0  PHQ- 9 Score - - - -    Fall Risk Fall Risk  09/26/2018 09/08/2017 03/16/2017 09/16/2015 01/04/2013  Falls in the past year? 0 No No No No  Follow up Falls prevention discussed - - - -    Is the patient's home free of loose throw rugs in walkways, pet beds, electrical cords, etc?   yes      Grab bars in the bathroom? no      Handrails on the stairs?   yes      Adequate lighting?   yes  Cognitive Function:     6CIT Screen 09/26/2018  What Year? 0 points  What month? 0 points  What time? 0 points  Count back from 20 0 points  Months in reverse 0 points  Repeat phrase 0 points  Total Score 0    Screening Tests Health Maintenance  Topic Date Due  . COLONOSCOPY  04/07/2019 (Originally 05/25/2017)  . INFLUENZA VACCINE  12/24/2018  . TETANUS/TDAP  01/15/2021  . Hepatitis C Screening  Completed  . PNA vac Low Risk Adult  Completed        Plan:      Mr. Skowron , Thank you for taking time to come for your Medicare Wellness Visit. I appreciate your ongoing commitment to your health goals. Please review the following plan we discussed and let me know if I can assist you in the future.   Please schedule your next medicare wellness visit with me in 1 yr.   These are the goals we discussed: Goals    . Patient Stated     Patient states would like to eat a healthier diet and smaller portions.       This  is a list of the screening recommended for you and due dates:  Health Maintenance  Topic Date Due  . Colon Cancer Screening  04/07/2019*  . Flu Shot  12/24/2018  . Tetanus Vaccine  01/15/2021  .  Hepatitis C: One time screening is recommended by Center  for Disease Control  (CDC) for  adults born from [birth year] through 1966.   Completed  . Pneumonia vaccines  Completed  *Topic was postponed. The date shown is not the original due date.     I have personally reviewed and noted the following in the patient's chart:   . Medical and social history . Use of alcohol, tobacco or illicit drugs  . Current medications and supplements . Functional ability and status . Nutritional status . Physical activity . Advanced directives . List of other physicians . Hospitalizations, surgeries, and ER visits in previous 12 months . Vitals . Screenings to include cognitive, depression, and falls . Referrals and appointments  In addition, I have reviewed and discussed with patient certain preventive protocols, quality metrics, and best practice recommendations. A written personalized care plan for preventive services as well as general preventive health recommendations were provided to patient.     Joanne Chars, LPN   5/0/1586

## 2018-09-26 ENCOUNTER — Ambulatory Visit (INDEPENDENT_AMBULATORY_CARE_PROVIDER_SITE_OTHER): Payer: Medicare Other | Admitting: *Deleted

## 2018-09-26 VITALS — BP 128/82 | Ht 74.0 in | Wt 245.0 lb

## 2018-09-26 DIAGNOSIS — Z0000 Encounter for general adult medical examination without abnormal findings: Secondary | ICD-10-CM | POA: Diagnosis not present

## 2018-09-26 NOTE — Patient Instructions (Addendum)
Christopher [name] , Thank you for taking time to come for your Medicare Wellness Visit. I appreciate your ongoing commitment to your health goals. Please review the following plan we discussed and let me know if I can assist you in the future.   Please schedule your next medicare wellness visit with me in 1 yr.  These are the goals we discussed: Goals    . Patient Stated     Patient states would like to eat a healthier diet and smaller portions.

## 2018-09-29 ENCOUNTER — Telehealth: Payer: Self-pay | Admitting: *Deleted

## 2018-09-29 NOTE — Telephone Encounter (Signed)
Ok to schedule colonoscopy per Dr. Rush Landmark.  Spoke with pt- he is in [place] until September.  Recall put in for 02-23-19 for him to reschedule appointment.  Pt is aware that he will be receiving this letter- [phone].

## 2018-10-20 ENCOUNTER — Telehealth: Payer: Self-pay | Admitting: *Deleted

## 2018-10-20 NOTE — Telephone Encounter (Signed)
Called pt to schedule he 6 mo f/u. pt is in [place] and will rtn to [place] in the fall. Will call to schedule an appt when he gets back.Maryruth Eve, Lahoma Crocker, CMA

## 2018-12-05 ENCOUNTER — Other Ambulatory Visit: Payer: Self-pay | Admitting: Family Medicine

## 2018-12-05 ENCOUNTER — Other Ambulatory Visit: Payer: Self-pay | Admitting: *Deleted

## 2018-12-05 DIAGNOSIS — E7800 Pure hypercholesterolemia, unspecified: Secondary | ICD-10-CM

## 2018-12-05 MED ORDER — CITALOPRAM HYDROBROMIDE 20 MG PO TABS: 20.0000 mg | ORAL_TABLET | Freq: Every day | ORAL | 3 refills | Status: DC

## 2018-12-05 MED ORDER — LISINOPRIL-HYDROCHLOROTHIAZIDE 10-12.5 MG PO TABS: 1.0000 | ORAL_TABLET | Freq: Every day | ORAL | 0 refills | Status: DC

## 2018-12-08 ENCOUNTER — Other Ambulatory Visit: Payer: Self-pay | Admitting: *Deleted

## 2018-12-08 MED ORDER — ANORO ELLIPTA 62.5-25 MCG/INH IN AEPB: INHALATION_SPRAY | RESPIRATORY_TRACT | 3 refills | Status: DC

## 2019-01-02 ENCOUNTER — Encounter: Payer: Self-pay | Admitting: Gastroenterology

## 2019-02-27 ENCOUNTER — Encounter: Payer: Self-pay | Admitting: Gastroenterology

## 2019-03-08 ENCOUNTER — Other Ambulatory Visit: Payer: Self-pay | Admitting: *Deleted

## 2019-03-08 ENCOUNTER — Encounter: Payer: Self-pay | Admitting: Family Medicine

## 2019-03-08 ENCOUNTER — Ambulatory Visit (INDEPENDENT_AMBULATORY_CARE_PROVIDER_SITE_OTHER): Payer: Medicare Other | Admitting: Family Medicine

## 2019-03-08 ENCOUNTER — Other Ambulatory Visit: Payer: Self-pay

## 2019-03-08 VITALS — BP 118/73 | HR 93 | Ht 74.0 in | Wt 248.0 lb

## 2019-03-08 DIAGNOSIS — N1831 Chronic kidney disease, stage 3a: Secondary | ICD-10-CM

## 2019-03-08 DIAGNOSIS — M545 Low back pain, unspecified: Secondary | ICD-10-CM

## 2019-03-08 DIAGNOSIS — H60542 Acute eczematoid otitis externa, left ear: Secondary | ICD-10-CM

## 2019-03-08 DIAGNOSIS — Z125 Encounter for screening for malignant neoplasm of prostate: Secondary | ICD-10-CM

## 2019-03-08 DIAGNOSIS — J410 Simple chronic bronchitis: Secondary | ICD-10-CM

## 2019-03-08 DIAGNOSIS — Z23 Encounter for immunization: Secondary | ICD-10-CM | POA: Diagnosis not present

## 2019-03-08 DIAGNOSIS — I10 Essential (primary) hypertension: Secondary | ICD-10-CM

## 2019-03-08 MED ORDER — ALBUTEROL SULFATE HFA 108 (90 BASE) MCG/ACT IN AERS: 2.0000 | INHALATION_SPRAY | Freq: Four times a day (QID) | RESPIRATORY_TRACT | 4 refills | Status: DC | PRN

## 2019-03-08 MED ORDER — LISINOPRIL-HYDROCHLOROTHIAZIDE 10-12.5 MG PO TABS: 1.0000 | ORAL_TABLET | Freq: Every day | ORAL | 1 refills | Status: DC

## 2019-03-08 MED ORDER — TRIAMCINOLONE ACETONIDE 0.1 % EX CREA: 1.0000 "application " | TOPICAL_CREAM | Freq: Every day | CUTANEOUS | 0 refills | Status: DC | PRN

## 2019-03-08 NOTE — Assessment & Plan Note (Addendum)
Due to recheck renal function. 

## 2019-03-08 NOTE — Progress Notes (Signed)
Called pharmacy and spoke w/Janice and asked that the Triamcinolone cream be discontinued.Maryruth Eve, Lahoma Crocker, CMA

## 2019-03-08 NOTE — Progress Notes (Addendum)
Established Patient Office Visit  Subjective:  Patient ID: Christopher [name], male    DOB: [birth year]  Age: 68 y.o. MRN: [id number]  CC:  Chief Complaint  Patient presents with  . Hypertension  . COPD    HPI Christopher [name] presents for   Hypertension- Pt denies chest pain, SOB, dizziness, or heart palpitations.  Taking meds as directed w/o problems.  Denies medication side effects.    F/U COPD -overall he is doing well he stays active.  He says he can even remember the last time he used his albuterol he thinks it might be expired.  He has been walking 5 days a week for exercise most of the summer he was visiting family and did not exercise consistently while he was there.  F/U CKD3 -recent changes.  Has noticed some decreased hearing he wonders if it could be cerumen blockage.  He did try using some Debrox drops for a couple of days.  He wanted his ears checked.  He also complains of bilateral low back pain just above the buttock area and below the SI joints.  He says is worse after he has traveled inside in the car for 16 hours in fact it is so painful for a couple of days that it is hard to do a lot and that it gradually gets better on its own.  He denies any numbness and tingling down to the legs.  Thinks it is his hips.   Past Medical History:  Diagnosis Date  . Anxiety   . Blood transfusion without reported diagnosis   . Clotting disorder ([place])   . COPD (chronic obstructive pulmonary disease) ([place])   . Depression   . Gastric ulcer   . GERD (gastroesophageal reflux disease)   . Hyperlipidemia   . Hypertension   . Pancreatitis    11/2010. 2011  . Stroke [facility])     Past Surgical History:  Procedure Laterality Date  . abdominal reconstructin     X 3  . BILE DUCT EXPLORATION    . CHOLECYSTECTOMY    . Duodenum Stricture    . Explorratory Laparotomy    . GASTRECTOMY    . GASTROJEJUNOSTOMY    . LAPAROSCOPIC LYSIS OF ADHESIONS    . Pancreatic leak repair    . Resection of  distal stomach and proximal Roux limb    . ROUX-EN-Y GASTRIC BYPASS      Family History  Problem Relation Age of Onset  . Hypertension Mother   . Heart attack Father   . Prostate cancer Father        prostate, lip cancer  . Uterine cancer Sister   . Lung cancer Brother   . Colon cancer Sister 18  . Colon cancer Sister 39  . Ovarian cancer Sister   . Esophageal cancer Neg Hx   . Stomach cancer Neg Hx   . Rectal cancer Neg Hx   . Inflammatory bowel disease Neg Hx   . Liver disease Neg Hx   . Pancreatic cancer Neg Hx     Social History   Socioeconomic History  . Marital status: Divorced    Spouse name: Not on file  . Number of children: 4  . Years of education: 71  . Highest education level: 12th grade  Occupational History  . 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 Financial resource strain: Not hard at all  . Food insecurity    Worry: Never true  Inability: Never true  . Transportation needs    Medical: No    Non-medical: No  Tobacco Use  . Smoking status: Former Research scientist (life sciences)  . Smokeless tobacco: Current User    Types: Chew  . Tobacco comment: Quit 15 years ago  Substance and Sexual Activity  . Alcohol use: Yes    Comment: Once a week mixed drink   . Drug use: No  . Sexual activity: Not Currently  Lifestyle  . Physical activity    Days per week: 5 days    Minutes per session: 10 min  . Stress: Not at all  Relationships  . Social Herbalist on phone: Three times a week    Gets together: Three times a week    Attends religious service: Never    Active member of club or organization: No    Attends meetings of clubs or organizations: Never    Relationship status: Divorced  . Intimate partner violence    Fear of current or ex partner: No    Emotionally abused: No    Physically abused: No    Forced sexual activity: No  Other Topics Concern  . Not on file  Social History Narrative   Getting ready to go to [place] for the  summer.   Lives with daughter and grandkids    Outpatient Medications Prior to Visit  Medication Sig Dispense Refill  . citalopram (CELEXA) 20 MG tablet Take 1 tablet (20 mg total) by mouth daily. 90 tablet 3  . Multiple Vitamin (MULTIVITAMIN) tablet Take 1 tablet by mouth daily.    Marland Kitchen omeprazole (PRILOSEC) 20 MG capsule Take 1 capsule by mouth once daily 90 capsule 3  . rosuvastatin (CRESTOR) 10 MG tablet Take 1 tablet by mouth once daily 90 tablet 1  . umeclidinium-vilanterol (ANORO ELLIPTA) 62.5-25 MCG/INH AEPB INHALE 1 PUFF INTO LUNGS ONCE DAILY 180 each 3  . lisinopril-hydrochlorothiazide (ZESTORETIC) 10-12.5 MG tablet Take 1 tablet by mouth daily. 90 tablet 0  . albuterol (PROAIR HFA) 108 (90 Base) MCG/ACT inhaler Inhale 2 puffs into the lungs every 6 (six) hours as needed for wheezing. 1 Inhaler prn  . SUPREP BOWEL PREP KIT 17.5-3.13-1.6 GM/177ML SOLN Take 1 kit by mouth as directed. For colonoscopy prep (Patient not taking: Reported on 09/26/2018) 2 Bottle 0   No facility-administered medications prior to visit.     Allergies  Allergen Reactions  . Lorazepam Other (See Comments)    hallucinations  . Nsaids Other (See Comments)    Hx of chronic recurrent GI ulcer.   . Onion Nausea And Vomiting  . Pravastatin Other (See Comments)    myalgias  . Simvastatin Other (See Comments)    myalgias    ROS Review of Systems    Objective:    Physical Exam  Constitutional: He is oriented to person, place, and time. He appears well-developed and well-nourished.  HENT:  Head: Normocephalic and atraumatic.  Right Ear: External ear normal.  Left Ear: External ear normal.  Some cerumen blocking about half of TM bilaterally.    Cardiovascular: Normal rate, regular rhythm and normal heart sounds.  Pulmonary/Chest: Effort normal and breath sounds normal.  Neurological: He is alert and oriented to person, place, and time.  Skin: Skin is warm and dry.  Psychiatric: He has a normal mood  and affect. His behavior is normal.    BP 118/73   Pulse 93   Ht '6\' 2"'  (1.88 m)   Wt 248 lb (112.5 kg)  SpO2 94%   BMI 31.84 kg/m  Wt Readings from Last 3 Encounters:  03/08/19 248 lb (112.5 kg)  09/26/18 245 lb (111.1 kg)  07/12/18 245 lb (111.1 kg)     There are no preventive care reminders to display for this patient.  There are no preventive care reminders to display for this patient.  Lab Results  Component Value Date   TSH 1.252 10/13/2012   Lab Results  Component Value Date   WBC 4.1 04/04/2018   HGB 14.3 04/04/2018   HCT 40.8 04/04/2018   MCV 95.6 04/04/2018   PLT 226 04/04/2018   Lab Results  Component Value Date   NA 140 04/04/2018   K 5.2 04/04/2018   CO2 29 04/04/2018   GLUCOSE 102 (H) 04/04/2018   BUN 25 04/04/2018   CREATININE 1.40 (H) 07/15/2018   BILITOT 0.7 04/04/2018   ALKPHOS 104 04/10/2016   AST 20 04/04/2018   ALT 26 04/04/2018   PROT 7.0 04/04/2018   ALBUMIN 4.5 04/10/2016   CALCIUM 9.5 04/04/2018   Lab Results  Component Value Date   CHOL 181 04/04/2018   Lab Results  Component Value Date   HDL 36 (L) 04/04/2018   Lab Results  Component Value Date   LDLCALC 113 (H) 04/04/2018   Lab Results  Component Value Date   TRIG 197 (H) 04/04/2018   Lab Results  Component Value Date   CHOLHDL 5.0 (H) 04/04/2018   No results found for: HGBA1C    Assessment & Plan:   Problem List Items Addressed This Visit      Cardiovascular and Mediastinum   Essential hypertension - Primary    Well controlled. Continue current regimen. Follow up in  62mo     Relevant Medications   lisinopril-hydrochlorothiazide (ZESTORETIC) 10-12.5 MG tablet   Other Relevant Orders   Lipid Panel w/reflex Direct LDL   COMPLETE METABOLIC PANEL WITH GFR     Respiratory   [place]    Table.  Continue with Anoro.  We discussed trying to use his albuterol before exercise to see if this makes a difference in his  stamina.  Also need to get him scheduled for repeat spirometry.  Think the last one was about 5 years ago.      Relevant Medications   albuterol (PROAIR HFA) 108 (90 Base) MCG/ACT inhaler     Nervous and Auditory   RESOLVED: Eczema of left external ear    Sent over prescription for triamcinolone cream to use as needed.        Genitourinary   CKD (chronic kidney disease) stage 3, GFR 30-59 ml/min    Due to recheck renal function.      Relevant Orders   Lipid Panel w/reflex Direct LDL   COMPLETE METABOLIC PANEL WITH GFR     Other   Acute bilateral low back pain without sciatica    Think some of his pain is actually coming from his low back and not his hip.  It sounds discogenic in nature.  Given a handout for stretches to do on his own at home.  Call if not improving or if suddenly getting worse.  It really only flares when he has been sitting for long periods or traveling.       Other Visit Diagnoses    Screening for prostate cancer       Relevant Orders   PSA   Need for immunization against influenza  Relevant Orders   Flu Vaccine QUAD High Dose(Fluad) (Completed)     Low back pain-sounds like it is more discogenic.  Handout for exercises given.  He is actually feeling better today and just seems to be exacerbated with prolonged sitting and travel.  Do not think that this is a hip pathology.  If getting worse or not improving then please follow-up.  Meds ordered this encounter  Medications  . albuterol (PROAIR HFA) 108 (90 Base) MCG/ACT inhaler    Sig: Inhale 2 puffs into the lungs every 6 (six) hours as needed for wheezing.    Dispense:  3 g    Refill:  4  . lisinopril-hydrochlorothiazide (ZESTORETIC) 10-12.5 MG tablet    Sig: Take 1 tablet by mouth daily.    Dispense:  90 tablet    Refill:  1  . DISCONTD: triamcinolone cream (KENALOG) 0.1 %    Sig: Apply 1 application topically daily as needed.    Dispense:  30 g    Refill:  0    Follow-up: Return in about  6 months (around 09/06/2019) for Hypertension and COPD.    Beatrice Lecher, MD

## 2019-03-08 NOTE — Assessment & Plan Note (Signed)
Think some of his pain is actually coming from his low back and not his hip.  It sounds discogenic in nature.  Given a handout for stretches to do on his own at home.  Call if not improving or if suddenly getting worse.  It really only flares when he has been sitting for long periods or traveling.

## 2019-03-08 NOTE — Assessment & Plan Note (Addendum)
Sent over prescription for triamcinolone cream to use as needed.

## 2019-03-08 NOTE — Assessment & Plan Note (Signed)
Well controlled. Continue current regimen. Follow up in  6 mo  

## 2019-03-08 NOTE — Assessment & Plan Note (Addendum)
Table.  Continue with Anoro.  We discussed trying to use his albuterol before exercise to see if this makes a difference in his stamina.  Also need to get him scheduled for repeat spirometry.  Think the last one was about 5 years ago.

## 2019-03-09 LAB — LIPID PANEL W/REFLEX DIRECT LDL
Cholesterol: 176 mg/dL (ref <200)
HDL: 37 mg/dL — ABNORMAL LOW (ref >=40)
LDL Cholesterol (Calc): 99 mg/dL (calc)
Non-HDL Cholesterol (Calc): 139 mg/dL (calc) — ABNORMAL HIGH (ref <130)
Total CHOL/HDL Ratio: 4.8 (calc) (ref <5.0)
Triglycerides: 302 mg/dL — ABNORMAL HIGH (ref <150)

## 2019-03-09 LAB — COMPLETE METABOLIC PANEL WITH GFR
AG Ratio: 1.7 (calc) (ref 1.0–2.5)
ALT: 26 U/L (ref 9–46)
AST: 23 U/L (ref 10–35)
Albumin: 4.5 g/dL (ref 3.6–5.1)
Alkaline phosphatase (APISO): 99 U/L (ref 35–144)
BUN/Creatinine Ratio: 17 (calc) (ref 6–22)
BUN: 23 mg/dL (ref 7–25)
CO2: 26 mmol/L (ref 20–32)
Calcium: 9.4 mg/dL (ref 8.6–10.3)
Chloride: 104 mmol/L (ref 98–110)
Creat: 1.33 mg/dL — ABNORMAL HIGH (ref 0.70–1.25)
GFR, Est African American: 63 mL/min/{1.73_m2} (ref >=60)
GFR, Est Non African American: 55 mL/min/{1.73_m2} — ABNORMAL LOW (ref >=60)
Globulin: 2.6 g/dL (calc) (ref 1.9–3.7)
Glucose, Bld: 128 mg/dL — ABNORMAL HIGH (ref 65–99)
Potassium: 4.9 mmol/L (ref 3.5–5.3)
Sodium: 140 mmol/L (ref 135–146)
Total Bilirubin: 0.5 mg/dL (ref 0.2–1.2)
Total Protein: 7.1 g/dL (ref 6.1–8.1)

## 2019-03-09 LAB — PSA: PSA: 1.8 ng/mL (ref <=4.0)

## 2019-03-14 ENCOUNTER — Other Ambulatory Visit: Payer: Self-pay | Admitting: Family Medicine

## 2019-03-14 DIAGNOSIS — E7800 Pure hypercholesterolemia, unspecified: Secondary | ICD-10-CM

## 2019-03-21 ENCOUNTER — Encounter: Payer: Self-pay | Admitting: Gastroenterology

## 2019-03-21 ENCOUNTER — Ambulatory Visit (AMBULATORY_SURGERY_CENTER): Payer: Self-pay

## 2019-03-21 ENCOUNTER — Other Ambulatory Visit: Payer: Self-pay

## 2019-03-21 VITALS — Temp 96.8°F | Ht 73.0 in | Wt 249.6 lb

## 2019-03-21 DIAGNOSIS — Z800 Family history of malignant neoplasm of digestive organs: Secondary | ICD-10-CM

## 2019-03-21 MED ORDER — NA SULFATE-K SULFATE-MG SULF 17.5-3.13-1.6 GM/177ML PO SOLN: 1.0000 | Freq: Once | ORAL | 0 refills | Status: AC

## 2019-03-21 NOTE — Progress Notes (Signed)
Denies allergies to eggs or soy products. Denies complication of anesthesia or sedation. Denies use of weight loss medication. Denies use of O2.   Emmi instructions given for colonoscopy.  Covid screening is scheduled for 04/04/19 @ 8:20 Am. Patient was previously scheduled for a colonoscopy but it was postponed due to Covid 19. The patient already had his Suprep.

## 2019-03-30 ENCOUNTER — Other Ambulatory Visit: Payer: Self-pay | Admitting: Gastroenterology

## 2019-03-30 DIAGNOSIS — Z1159 Encounter for screening for other viral diseases: Secondary | ICD-10-CM | POA: Diagnosis not present

## 2019-03-30 LAB — SARS CORONAVIRUS 2 (TAT 6-24 HRS): SARS Coronavirus 2: NEGATIVE

## 2019-04-04 ENCOUNTER — Ambulatory Visit (AMBULATORY_SURGERY_CENTER): Payer: Medicare Other | Admitting: Gastroenterology

## 2019-04-04 ENCOUNTER — Other Ambulatory Visit: Payer: Self-pay | Admitting: Gastroenterology

## 2019-04-04 ENCOUNTER — Encounter: Payer: Self-pay | Admitting: Gastroenterology

## 2019-04-04 ENCOUNTER — Other Ambulatory Visit: Payer: Self-pay

## 2019-04-04 VITALS — BP 130/62 | HR 66 | Temp 98.2°F | Resp 12 | Ht 74.0 in | Wt 248.0 lb

## 2019-04-04 DIAGNOSIS — D124 Benign neoplasm of descending colon: Secondary | ICD-10-CM | POA: Diagnosis not present

## 2019-04-04 DIAGNOSIS — Z1211 Encounter for screening for malignant neoplasm of colon: Secondary | ICD-10-CM

## 2019-04-04 DIAGNOSIS — Z800 Family history of malignant neoplasm of digestive organs: Secondary | ICD-10-CM

## 2019-04-04 MED ORDER — SODIUM CHLORIDE 0.9 % IV SOLN: 500.0000 mL | Freq: Once | INTRAVENOUS | Status: DC

## 2019-04-04 NOTE — Patient Instructions (Signed)
YOU HAD AN ENDOSCOPIC PROCEDURE TODAY AT [facility]:   Refer to the procedure report that was given to you for any specific questions about what was found during the examination.  If the procedure report does not answer your questions, please call your gastroenterologist to clarify.  If you requested that your care partner not be given the details of your procedure findings, then the procedure report has been included in a sealed envelope for you to review at your convenience later.  YOU SHOULD EXPECT: Some feelings of bloating in the abdomen. Passage of more gas than usual.  Walking can help get rid of the air that was put into your GI tract during the procedure and reduce the bloating. If you had a lower endoscopy (such as a colonoscopy or flexible sigmoidoscopy) you may notice spotting of blood in your stool or on the toilet paper. If you underwent a bowel prep for your procedure, you may not have a normal bowel movement for a few days.  Please Note:  You might notice some irritation and congestion in your nose or some drainage.  This is from the oxygen used during your procedure.  There is no need for concern and it should clear up in a day or so.  SYMPTOMS TO REPORT IMMEDIATELY:   Following lower endoscopy (colonoscopy or flexible sigmoidoscopy):  Excessive amounts of blood in the stool  Significant tenderness or worsening of abdominal pains  Swelling of the abdomen that is new, acute  Fever of 100F or higher   For urgent or emergent issues, a gastroenterologist can be reached at any hour by calling [phone].   DIET:  We do recommend a small meal at first, but then you may proceed to your regular diet.  Drink plenty of fluids but you should avoid alcoholic beverages for 24 hours.  MEDICATIONS: Continue present medications. Use FiberCon 1 tablet by mouth daily.  Please see handouts given to you by your recovery nurse.  ACTIVITY:  You should plan to take it easy  for the rest of today and you should NOT DRIVE or use heavy machinery until tomorrow (because of the sedation medicines used during the test).    FOLLOW UP: Our staff will call the number listed on your records 48-72 hours following your procedure to check on you and address any questions or concerns that you may have regarding the information given to you following your procedure. If we do not reach you, we will leave a message.  We will attempt to reach you two times.  During this call, we will ask if you have developed any symptoms of COVID 19. If you develop any symptoms (ie: fever, flu-like symptoms, shortness of breath, cough etc.) before then, please call [phone].  If you test positive for Covid 19 in the 2 weeks post procedure, please call and report this information to [place].    If any biopsies were taken you will be contacted by phone or by letter within the next 1-3 weeks.  Please call us at [phone] if you have not heard about the biopsies in 3 weeks.   Thank you for allowing [place] to provide for your healthcare needs today.   SIGNATURES/CONFIDENTIALITY: You and/or your care partner have signed paperwork which will be entered into your electronic medical record.  These signatures attest to the fact that that the information above on your After Visit Summary has been reviewed and is understood.  Full responsibility of the confidentiality of  this discharge information lies with you and/or your care-partner.

## 2019-04-04 NOTE — Progress Notes (Signed)
Pt's states no medical or surgical changes since previsit or office visit.  JB - temp CW - vitals. 

## 2019-04-04 NOTE — Progress Notes (Signed)
To PACU< VSS. Report to [email or web address] 

## 2019-04-04 NOTE — Progress Notes (Signed)
Called to room to assist during endoscopic procedure.  Patient ID and intended procedure confirmed with present staff. Received instructions for my participation in the procedure from the performing physician.  

## 2019-04-04 NOTE — Op Note (Signed)
[facility] Patient Name: Christopher [name] Procedure Date: 04/04/2019 9:42 AM MRN: [id number] Endoscopist: Justice Britain , MD Age: 68 Referring MD:  Date of Birth: [birth year] Gender: Male Account #: [id number] Procedure:                Colonoscopy Indications:              Screening patient at increased risk: Family history                            of colorectal cancer in multiple 1st-degree                            relatives Medicines:                Monitored Anesthesia Care Procedure:                Pre-Anesthesia Assessment:                           - Prior to the procedure, a History and Physical                            was performed, and patient medications and                            allergies were reviewed. The patient's tolerance of                            previous anesthesia was also reviewed. The risks                            and benefits of the procedure and the sedation                            options and risks were discussed with the patient.                            All questions were answered, and informed consent                            was obtained. Prior Anticoagulants: The patient has                            taken no previous anticoagulant or antiplatelet                            agents. ASA Grade Assessment: II - A patient with                            mild systemic disease. After reviewing the risks                            and benefits, the patient was deemed in  satisfactory condition to undergo the procedure.                           After obtaining informed consent, the colonoscope                            was passed under direct vision. Throughout the                            procedure, the patient's blood pressure, pulse, and                            oxygen saturations were monitored continuously. The                            Colonoscope was introduced through the anus and                          advanced to the 5 cm into the ileum. The                            colonoscopy was performed without difficulty. The                            patient tolerated the procedure. The quality of the                            bowel preparation was good. The terminal ileum,                            ileocecal valve, appendiceal orifice, and rectum                            were photographed. Scope In: 10:00:34 AM Scope Out: 10:15:43 AM Scope Withdrawal Time: 0 hours 11 minutes 37 seconds  Total Procedure Duration: 0 hours 15 minutes 9 seconds  Findings:                 The digital rectal exam findings include                            hemorrhoids. Pertinent negatives include no                            palpable rectal lesions.                           The terminal ileum and ileocecal valve appeared                            normal.                           A 10 mm polyp was found in the descending colon.  The polyp was sessile. The polyp was removed with a                            cold snare. Resection and retrieval were complete.                           A few small-mouthed diverticula were found in the                            recto-sigmoid colon and sigmoid colon.                           Normal mucosa was found in the entire colon                            otherwise.                           Non-bleeding non-thrombosed internal hemorrhoids                            were found during retroflexion, during perianal                            exam and during digital exam. The hemorrhoids were                            Grade II (internal hemorrhoids that prolapse but                            reduce spontaneously). Complications:            No immediate complications. Estimated Blood Loss:     Estimated blood loss was minimal. Impression:               - Hemorrhoids found on digital rectal exam.                           -  The examined portion of the ileum was normal.                           - One 10 mm polyp in the descending colon, removed                            with a cold snare. Resected and retrieved.                           - Diverticulosis in the recto-sigmoid colon and in                            the sigmoid colon.                           - Normal mucosa in the entire examined colon  otherwise.                           - Non-bleeding non-thrombosed internal hemorrhoids. Recommendation:           - The patient will be observed post-procedure,                            until all discharge criteria are met.                           - Discharge patient to home.                           - Patient has a contact number available for                            emergencies. The signs and symptoms of potential                            delayed complications were discussed with the                            patient. Return to normal activities tomorrow.                            Written discharge instructions were provided to the                            patient.                           - High fiber diet.                           - Use FiberCon 1 tablet PO daily.                           - Continue present medications.                           - Await pathology results.                           - Repeat colonoscopy in 3 years for surveillance.                           - The findings and recommendations were discussed                            with the patient. Justice Britain, MD 04/04/2019 10:21:18 AM

## 2019-04-06 ENCOUNTER — Encounter: Payer: Self-pay | Admitting: Gastroenterology

## 2019-04-06 ENCOUNTER — Telehealth: Payer: Self-pay

## 2019-04-06 NOTE — Telephone Encounter (Signed)
  Follow up Call-  Call back number 04/04/2019  Post procedure Call Back phone  # [phone]  Permission to leave phone message Yes  Some recent data might be hidden     Patient questions:  Do you have a fever, pain , or abdominal swelling? No. Pain Score  0 *  Have you tolerated food without any problems? Yes.    Have you been able to return to your normal activities? Yes.    Do you have any questions about your discharge instructions: Diet   No. Medications  No. Follow up visit  No.  Do you have questions or concerns about your Care? No.  Actions: * If pain score is 4 or above: 1. No action needed, pain <4.Have you developed a fever since your procedure? no  2.   Have you had an respiratory symptoms (SOB or cough) since your procedure? no  3.   Have you tested positive for COVID 19 since your procedure no  4.   Have you had any family members/close contacts diagnosed with the COVID 19 since your procedure?  no   If yes to any of these questions please route to Joylene John, RN and Alphonsa Gin, Therapist, sports.

## 2019-04-18 DIAGNOSIS — H5213 Myopia, bilateral: Secondary | ICD-10-CM | POA: Diagnosis not present

## 2019-06-15 ENCOUNTER — Telehealth: Payer: Self-pay

## 2019-06-15 NOTE — Telephone Encounter (Signed)
Kostas wanted to know if he should be concerned about getting Bell's Palsy. He has an appointment for the vaccine for Tuesday.   As of right now there is no known contraindication to getting the covid vaccine. When you have more risk factors to complications of covid that is more reason to get the vaccine. Our providers have been recommending vaccine to all patients. It is important to remember when people are having the "reactions" this is an immune response. So you don't want to do anything preliminary to block immune response but after if it is more severe and you are uncomfortable you can take a bendardyl and ibuprofen. If having any trouble breathing go to UC or ED. Very few people are having immune responses so strong they need to go to ED but many people are not feeling well for 1 to 2 days after vaccine given. We currently do not have any information about if our office will be distributing the COVID vaccine. You can contact your local Health Department or go online to  [email or web address] For questions related to vaccine distribution or appointments, please email [email or web address] or call [phone].

## 2019-08-16 ENCOUNTER — Ambulatory Visit: Payer: Medicare Other | Admitting: Family Medicine

## 2019-08-16 ENCOUNTER — Other Ambulatory Visit: Payer: Self-pay

## 2019-08-16 ENCOUNTER — Encounter: Payer: Self-pay | Admitting: Family Medicine

## 2019-08-16 VITALS — BP 122/70 | HR 87 | Ht 74.0 in | Wt 244.0 lb

## 2019-08-16 DIAGNOSIS — I10 Essential (primary) hypertension: Secondary | ICD-10-CM | POA: Diagnosis not present

## 2019-08-16 DIAGNOSIS — M7130 Other bursal cyst, unspecified site: Secondary | ICD-10-CM | POA: Diagnosis not present

## 2019-08-16 DIAGNOSIS — J410 Simple chronic bronchitis: Secondary | ICD-10-CM | POA: Diagnosis not present

## 2019-08-16 DIAGNOSIS — R42 Dizziness and giddiness: Secondary | ICD-10-CM

## 2019-08-16 LAB — BASIC METABOLIC PANEL WITH GFR
BUN/Creatinine Ratio: 21 (calc) (ref 6–22)
BUN: 27 mg/dL — ABNORMAL HIGH (ref 7–25)
CO2: 28 mmol/L (ref 20–32)
Calcium: 9.4 mg/dL (ref 8.6–10.3)
Chloride: 105 mmol/L (ref 98–110)
Creat: 1.27 mg/dL — ABNORMAL HIGH (ref 0.70–1.25)
GFR, Est African American: 66 mL/min/{1.73_m2} (ref >=60)
GFR, Est Non African American: 57 mL/min/{1.73_m2} — ABNORMAL LOW (ref >=60)
Glucose, Bld: 98 mg/dL (ref 65–139)
Potassium: 4.9 mmol/L (ref 3.5–5.3)
Sodium: 140 mmol/L (ref 135–146)

## 2019-08-16 NOTE — Patient Instructions (Addendum)
Ganglion Cyst ( also called a Synovial cyst)  A ganglion cyst is a non-cancerous, fluid-filled lump that occurs near a joint or tendon. The cyst grows out of a joint or the lining of a tendon. Ganglion cysts most often develop in the hand or wrist, but they can also develop in the shoulder, elbow, hip, knee, ankle, or foot. Ganglion cysts are ball-shaped or egg-shaped. Their size can range from the size of a pea to larger than a grape. Increased activity may cause the cyst to get bigger because more fluid starts to build up. What are the causes? The exact cause of this condition is not known, but it may be related to:  Inflammation or irritation around the joint.  An injury.  Repetitive movements or overuse.  Arthritis. What increases the risk? You are more likely to develop this condition if:  You are a woman.  You are 25-20 years old. What are the signs or symptoms? The main symptom of this condition is a lump. It most often appears on the hand or wrist. In many cases, there are no other symptoms, but a cyst can sometimes cause:  Tingling.  Pain.  Numbness.  Muscle weakness.  Weak grip.  Less range of motion in a joint. How is this diagnosed? Ganglion cysts are usually diagnosed based on a physical exam. Your health care provider will feel the lump and may shine a light next to it. If it is a ganglion cyst, the light will likely shine through it. Your health care provider may order an X-ray, ultrasound, or MRI to rule out other conditions. How is this treated? Ganglion cysts often go away on their own without treatment. If you have pain or other symptoms, treatment may be needed. Treatment is also needed if the ganglion cyst limits your movement or if it gets infected. Treatment may include:  Wearing a brace or splint on your wrist or finger.  Taking anti-inflammatory medicine.  Having fluid drained from the lump with a needle (aspiration).  Getting a steroid injected  into the joint.  Having surgery to remove the ganglion cyst.  Placing a pad on your shoe or wearing shoes that will not rub against the cyst if it is on your foot. Follow these instructions at home:  Do not press on the ganglion cyst, poke it with a needle, or hit it.  Take over-the-counter and prescription medicines only as told by your health care provider.  If you have a brace or splint: ? Wear it as told by your health care provider. ? Remove it as told by your health care provider. Ask if you need to remove it when you take a shower or a bath.  Watch your ganglion cyst for any changes.  Keep all follow-up visits as told by your health care provider. This is important. Contact a health care provider if:  Your ganglion cyst becomes larger or more painful.  You have pus coming from the lump.  You have weakness or numbness in the affected area.  You have a fever or chills. Get help right away if:  You have a fever and have any of these in the cyst area: ? Increased redness. ? Red streaks. ? Swelling. Summary  A ganglion cyst is a non-cancerous, fluid-filled lump that occurs near a joint or tendon.  Ganglion cysts most often develop in the hand or wrist, but they can also develop in the shoulder, elbow, hip, knee, ankle, or foot.  Ganglion cysts often go  away on their own without treatment. This information is not intended to replace advice given to you by your health care provider. Make sure you discuss any questions you have with your health care provider. Document Revised: 04/23/2017 Document Reviewed: 01/08/2017 Elsevier Patient Education  [place].

## 2019-08-16 NOTE — Progress Notes (Signed)
He reports that he

## 2019-08-16 NOTE — Assessment & Plan Note (Signed)
Stable.  Due for spirometry again. He will be out of town for 6 mo. Can schedule when he returns.

## 2019-08-16 NOTE — Progress Notes (Signed)
Established Patient Office Visit  Subjective:  Patient ID: Christopher [name], male    DOB: June 06, 1950  Age: 69 y.o. MRN: [id number]  CC:  Chief Complaint  Patient presents with  . Hypertension  . COPD    HPI [name] presents for   Hypertension- Pt denies chest pain, SOB, dizziness, or heart palpitations.  Taking meds as directed w/o problems.  Denies medication side effects.    F/U COPD - Stable.  No recent flares or exacerbations.  Also more recently been experiencing some lightheadedness.  He feels like he hydrates well.  He has not had any recent changes in medications.  He says been going on for a couple of weeks it was definitely more noticeable and bothersome last week and seems a little bit better this week but it still happening he also had 1 episode reaction got nauseated with it not case he was actually lying on his back and he turned his head he was working on a car and suddenly felt nauseated and actually vomited with the dizziness.  But most of the time the lightheadedness is noticeable when he changes position.  He is also noticed a mild tremor that he says occasionally comes and goes its been there for several months it has been very gradual no family history of tremor.  It does affect both hands when it happens he does drink a couple cups of coffee in the morning but no excess caffeine.  Again no other known triggers that he does not take any new medications he does take Celexa though.  He is also noticed a knot at the base of the right thumb.  He says its not bothersome most of the time occasionally sore if he bumps it and he would like to know what it is and if it is worrisome or needs to be removed.  Past Medical History:  Diagnosis Date  . Anxiety   . Blood transfusion without reported diagnosis   . Clotting disorder ([place])   . COPD (chronic obstructive pulmonary disease) ([place])   . Depression   . Gastric ulcer   . GERD (gastroesophageal reflux disease)   .  Hyperlipidemia   . Hypertension   . Pancreatitis    11/2010. 2011  . Stroke [facility])     Past Surgical History:  Procedure Laterality Date  . abdominal reconstructin     X 3  . BILE DUCT EXPLORATION    . CHOLECYSTECTOMY    . Duodenum Stricture    . Explorratory Laparotomy    . GASTRECTOMY    . GASTROJEJUNOSTOMY    . LAPAROSCOPIC LYSIS OF ADHESIONS    . Pancreatic leak repair    . Resection of distal stomach and proximal Roux limb    . ROUX-EN-Y GASTRIC BYPASS      Family History  Problem Relation Age of Onset  . Hypertension Mother   . Heart attack Father   . Prostate cancer Father        prostate, lip cancer  . Uterine cancer Sister   . Lung cancer Brother   . Colon cancer Sister 30  . Colon cancer Sister 20  . Ovarian cancer Sister   . Esophageal cancer Neg Hx   . Stomach cancer Neg Hx   . Rectal cancer Neg Hx   . Inflammatory bowel disease Neg Hx   . Liver disease Neg Hx   . Pancreatic cancer Neg Hx     Social History   Socioeconomic History  . Marital  status: Divorced    Spouse name: Not on file  . Number of children: 4  . Years of education: 29  . Highest education level: 12th grade  Occupational History  . Occupation: Games developer work    Comment: retired  Tobacco Use  . Smoking status: Former Research scientist (life sciences)  . Smokeless tobacco: Current User    Types: Chew  . Tobacco comment: Quit 15 years ago  Substance and Sexual Activity  . Alcohol use: Yes    Comment: Once a week mixed drink   . Drug use: No  . Sexual activity: Not Currently  Other Topics Concern  . Not on file  Social History Narrative   Getting ready to go to [place] for the summer.   Lives with daughter and grandkids   Social Determinants of Health   Financial Resource Strain: Low Risk   . Difficulty of Paying Living Expenses: Not hard at all  Food Insecurity: No Food Insecurity  . Worried About Charity fundraiser in the Last Year: Never true  . Ran Out of Food in the Last Year: Never true   Transportation Needs: No Transportation Needs  . Lack of Transportation (Medical): No  . Lack of Transportation (Non-Medical): No  Physical Activity: Insufficiently Active  . Days of Exercise per Week: 5 days  . Minutes of Exercise per Session: 10 min  Stress: No Stress Concern Present  . Feeling of Stress : Not at all  Social Connections: Moderately Isolated  . Frequency of Communication with Friends and Family: Three times a week  . Frequency of Social Gatherings with Friends and Family: Three times a week  . Attends Religious Services: Never  . Active Member of Clubs or Organizations: No  . Attends Archivist Meetings: Never  . Marital Status: Divorced  Human resources officer Violence: Not At Risk  . Fear of Current or Ex-Partner: No  . Emotionally Abused: No  . Physically Abused: No  . Sexually Abused: No    Outpatient Medications Prior to Visit  Medication Sig Dispense Refill  . albuterol (PROAIR HFA) 108 (90 Base) MCG/ACT inhaler Inhale 2 puffs into the lungs every 6 (six) hours as needed for wheezing. 3 g 4  . citalopram (CELEXA) 20 MG tablet Take 1 tablet by mouth once daily 90 tablet 1  . lisinopril-hydrochlorothiazide (ZESTORETIC) 10-12.5 MG tablet Take 1 tablet by mouth daily. 90 tablet 1  . Multiple Vitamin (MULTIVITAMIN) tablet Take 1 tablet by mouth daily.    Marland Kitchen omeprazole (PRILOSEC) 20 MG capsule Take 1 capsule by mouth once daily 90 capsule 3  . rosuvastatin (CRESTOR) 10 MG tablet Take 1 tablet by mouth once daily 90 tablet 3  . umeclidinium-vilanterol (ANORO ELLIPTA) 62.5-25 MCG/INH AEPB INHALE 1 PUFF INTO LUNGS ONCE DAILY 180 each 3  . 0.9 %  sodium chloride infusion      No facility-administered medications prior to visit.    Allergies  Allergen Reactions  . Lorazepam Other (See Comments)    hallucinations  . Nsaids Other (See Comments)    Hx of chronic recurrent GI ulcer.   . Onion Nausea And Vomiting  . Pravastatin Other (See Comments)    myalgias   . Simvastatin Other (See Comments)    myalgias    ROS Review of Systems    Objective:    Physical Exam  Constitutional: He is oriented to person, place, and time. He appears well-developed and well-nourished.  HENT:  Head: Normocephalic and atraumatic.  Right Ear: External ear normal.  Left Ear: External ear normal.  Nose: Nose normal.  Mouth/Throat: Oropharynx is clear and moist.  TMs and canals are clear.   Eyes: Pupils are equal, round, and reactive to light. Conjunctivae and EOM are normal.  Neck: No thyromegaly present.  Cardiovascular: Normal rate, regular rhythm and normal heart sounds.  Pulmonary/Chest: Effort normal and breath sounds normal.  Musculoskeletal:     Cervical back: Neck supple.     Comments:   At the base of the thumb at the right near the wrist he has what is consistent with a synovial cyst.  It is pea-sized.  Nontender on exam.  Lymphadenopathy:    He has no cervical adenopathy.  Neurological: He is alert and oriented to person, place, and time.  He does have a very mild high-frequency tremor in both hands. Negative Diks-Hallpike manuver.  Skin: Skin is warm and dry.  Psychiatric: He has a normal mood and affect. His behavior is normal.    BP 122/70   Pulse 87   Ht 6\' 2"  (1.88 m)   Wt 244 lb (110.7 kg)   SpO2 98%   BMI 31.33 kg/m  Wt Readings from Last 3 Encounters:  08/16/19 244 lb (110.7 kg)  04/04/19 248 lb (112.5 kg)  03/21/19 249 lb 9.6 oz (113.2 kg)     There are no preventive care reminders to display for this patient.  There are no preventive care reminders to display for this patient.  Lab Results  Component Value Date   TSH 1.252 10/13/2012   Lab Results  Component Value Date   WBC 4.1 04/04/2018   HGB 14.3 04/04/2018   HCT 40.8 04/04/2018   MCV 95.6 04/04/2018   PLT 226 04/04/2018   Lab Results  Component Value Date   NA 140 03/08/2019   K 4.9 03/08/2019   CO2 26 03/08/2019   GLUCOSE 128 (H) 03/08/2019   BUN 23  03/08/2019   CREATININE 1.33 (H) 03/08/2019   BILITOT 0.5 03/08/2019   ALKPHOS 104 04/10/2016   AST 23 03/08/2019   ALT 26 03/08/2019   PROT 7.1 03/08/2019   ALBUMIN 4.5 04/10/2016   CALCIUM 9.4 03/08/2019   Lab Results  Component Value Date   CHOL 176 03/08/2019   Lab Results  Component Value Date   HDL 37 (L) 03/08/2019   Lab Results  Component Value Date   LDLCALC 99 03/08/2019   Lab Results  Component Value Date   TRIG 302 (H) 03/08/2019   Lab Results  Component Value Date   CHOLHDL 4.8 03/08/2019   No results found for: HGBA1C    Assessment & Plan:   Problem List Items Addressed This Visit      Cardiovascular and Mediastinum   Essential hypertension - Primary    Well controlled. Continue current regimen. Follow up in  6 mo      Relevant Orders   BASIC METABOLIC PANEL WITH GFR     Respiratory   COPD (chronic obstructive pulmonary disease) (HCC)    Stable.  Due for spirometry again. He will be out of town for 6 mo. Can schedule when he returns.         Musculoskeletal and Integument   Synovial cyst    Other Visit Diagnoses    Lightheadedness         Lightheadedness-unclear etiology.  The episode where he was lying on his back and turned his head sounds very consistent with benign positional vertigo but he had a deck negative negative  Dix-Hallpike maneuver today.  Most of the  episodes seem to be more consistent with orthostasis.  We did do orthostatics on him and his blood pressure did drop about 14 points from sitting to standing.  With the lowest blood pressure being 104/53.  Pulse remained stable.  We discussed maybe hydrating well.  Also encouraged him to check his blood pressures at home.  Normally when he first comes in it is a little elevated we always have to recheck it.  This time it was perfect on the initial check so I am wondering if he actually is getting a little bit lower pressures at home and that could be contributing to the  lightheadedness he has been experiencing encouraged him to call me back in the next week or 2 with those blood pressures.  Synovial cyst-gave reassurance about the benign nature of this lesion.  Certainly it can be treated with aspiration but can recur.  Right now is not bothering him so we will just continue to monitor.  No orders of the defined types were placed in this encounter.   Follow-up: Return in about 6 months (around 02/16/2020) for copd/bp.    Beatrice Lecher, MD

## 2019-08-16 NOTE — Assessment & Plan Note (Signed)
Well controlled. Continue current regimen. Follow up in  6 mo  

## 2019-08-24 ENCOUNTER — Other Ambulatory Visit: Payer: Self-pay | Admitting: Family Medicine

## 2019-08-24 DIAGNOSIS — E7800 Pure hypercholesterolemia, unspecified: Secondary | ICD-10-CM

## 2019-09-11 ENCOUNTER — Ambulatory Visit: Payer: Medicare Other | Admitting: Family Medicine

## 2020-02-01 ENCOUNTER — Other Ambulatory Visit: Payer: Self-pay | Admitting: Family Medicine

## 2020-03-04 ENCOUNTER — Ambulatory Visit: Payer: Medicare Other | Admitting: Family Medicine

## 2020-03-11 ENCOUNTER — Other Ambulatory Visit: Payer: Self-pay

## 2020-03-11 ENCOUNTER — Ambulatory Visit (INDEPENDENT_AMBULATORY_CARE_PROVIDER_SITE_OTHER): Payer: Medicare Other | Admitting: Family Medicine

## 2020-03-11 ENCOUNTER — Encounter: Payer: Self-pay | Admitting: Family Medicine

## 2020-03-11 VITALS — BP 128/68 | HR 68 | Ht 74.0 in | Wt 252.0 lb

## 2020-03-11 DIAGNOSIS — J410 Simple chronic bronchitis: Secondary | ICD-10-CM

## 2020-03-11 DIAGNOSIS — I10 Essential (primary) hypertension: Secondary | ICD-10-CM | POA: Diagnosis not present

## 2020-03-11 DIAGNOSIS — E7800 Pure hypercholesterolemia, unspecified: Secondary | ICD-10-CM

## 2020-03-11 DIAGNOSIS — Z125 Encounter for screening for malignant neoplasm of prostate: Secondary | ICD-10-CM | POA: Diagnosis not present

## 2020-03-11 DIAGNOSIS — Z23 Encounter for immunization: Secondary | ICD-10-CM

## 2020-03-11 DIAGNOSIS — N1831 Chronic kidney disease, stage 3a: Secondary | ICD-10-CM

## 2020-03-11 DIAGNOSIS — F325 Major depressive disorder, single episode, in full remission: Secondary | ICD-10-CM

## 2020-03-11 MED ORDER — ROSUVASTATIN CALCIUM 10 MG PO TABS: 10.0000 mg | ORAL_TABLET | Freq: Every day | ORAL | 2 refills | Status: DC

## 2020-03-11 MED ORDER — CITALOPRAM HYDROBROMIDE 20 MG PO TABS: 20.0000 mg | ORAL_TABLET | Freq: Every day | ORAL | 1 refills | Status: DC

## 2020-03-11 MED ORDER — OMEPRAZOLE 20 MG PO CPDR: 20.0000 mg | DELAYED_RELEASE_CAPSULE | Freq: Every day | ORAL | 1 refills | Status: DC

## 2020-03-11 MED ORDER — LISINOPRIL-HYDROCHLOROTHIAZIDE 10-12.5 MG PO TABS: 1.0000 | ORAL_TABLET | Freq: Every day | ORAL | 1 refills | Status: DC

## 2020-03-11 NOTE — Assessment & Plan Note (Signed)
Following renal function every 6 months. 

## 2020-03-11 NOTE — Assessment & Plan Note (Signed)
Able on citalopram.  Would like to continue medication for now.  Low up in 6 months.

## 2020-03-11 NOTE — Progress Notes (Signed)
Established Patient Office Visit  Subjective:  Patient ID: Christopher [name], male    DOB: September 06, 1950  Age: 69 y.o. MRN: [id number]  CC:  Chief Complaint  Patient presents with  . COPD  . Hypertension    HPI [name] presents for   Hypertension- Pt denies chest pain, SOB, dizziness, or heart palpitations.  Taking meds as directed w/o problems.  Denies medication side effects.    Follow up COPD- no recent flares or exacerbations.    Hyperlipidemia - tolerating stating well with no myalgias or significant side effects.  Lab Results  Component Value Date   CHOL 176 03/08/2019   HDL 37 (L) 03/08/2019   LDLCALC 99 03/08/2019   TRIG 302 (H) 03/08/2019   CHOLHDL 4.8 03/08/2019    F/U CKD 3 -  No recent changes.    Past Medical History:  Diagnosis Date  . Anxiety   . Blood transfusion without reported diagnosis   . Clotting disorder ([place])   . COPD (chronic obstructive pulmonary disease) ([place])   . Depression   . Gastric ulcer   . GERD (gastroesophageal reflux disease)   . Hyperlipidemia   . Hypertension   . Pancreatitis    11/2010. 2011  . Stroke [facility])     Past Surgical History:  Procedure Laterality Date  . abdominal reconstructin     X 3  . BILE DUCT EXPLORATION    . CHOLECYSTECTOMY    . Duodenum Stricture    . Explorratory Laparotomy    . GASTRECTOMY    . GASTROJEJUNOSTOMY    . LAPAROSCOPIC LYSIS OF ADHESIONS    . Pancreatic leak repair    . Resection of distal stomach and proximal Roux limb    . ROUX-EN-Y GASTRIC BYPASS      Family History  Problem Relation Age of Onset  . Hypertension Mother   . Heart attack Father   . Prostate cancer Father        prostate, lip cancer  . Uterine cancer Sister   . Lung cancer Brother   . Colon cancer Sister 36  . Colon cancer Sister 59  . Ovarian cancer Sister   . Esophageal cancer Neg Hx   . Stomach cancer Neg Hx   . Rectal cancer Neg Hx   . Inflammatory bowel disease Neg Hx   . Liver disease Neg Hx   .  Pancreatic cancer Neg Hx     Social History   Socioeconomic History  . Marital status: Divorced    Spouse name: Not on file  . Number of children: 4  . Years of education: 58  . Highest education level: 12th grade  Occupational History  . Occupation: Games developer work    Comment: retired  Tobacco Use  . Smoking status: Former Research scientist (life sciences)  . Smokeless tobacco: Current User    Types: Chew  . Tobacco comment: Quit 15 years ago  Vaping Use  . Vaping Use: Never used  Substance and Sexual Activity  . Alcohol use: Yes    Comment: Once a week mixed drink   . Drug use: No  . Sexual activity: Not Currently  Other Topics Concern  . Not on file  Social History Narrative   Getting ready to go to [place] for the summer.   Lives with daughter and grandkids   Social Determinants of Health   Financial Resource Strain:   . Difficulty of Paying Living Expenses: Not on file  Food Insecurity:   . Worried About [employer] of  Food in the Last Year: Not on file  . Ran Out of Food in the Last Year: Not on file  Transportation Needs:   . Lack of Transportation (Medical): Not on file  . Lack of Transportation (Non-Medical): Not on file  Physical Activity:   . Days of Exercise per Week: Not on file  . Minutes of Exercise per Session: Not on file  Stress:   . Feeling of Stress : Not on file  Social Connections:   . Frequency of Communication with Friends and Family: Not on file  . Frequency of Social Gatherings with Friends and Family: Not on file  . Attends Religious Services: Not on file  . Active Member of Clubs or Organizations: Not on file  . Attends Archivist Meetings: Not on file  . Marital Status: Not on file  Intimate Partner Violence:   . Fear of Current or Ex-Partner: Not on file  . Emotionally Abused: Not on file  . Physically Abused: Not on file  . Sexually Abused: Not on file    Outpatient Medications Prior to Visit  Medication Sig Dispense Refill  .  umeclidinium-vilanterol (ANORO ELLIPTA) 62.5-25 MCG/INH AEPB Inhale 1 puff by mouth once daily 180 each 0  . albuterol (PROAIR HFA) 108 (90 Base) MCG/ACT inhaler Inhale 2 puffs into the lungs every 6 (six) hours as needed for wheezing. 3 g 4  . Multiple Vitamin (MULTIVITAMIN) tablet Take 1 tablet by mouth daily.    . citalopram (CELEXA) 20 MG tablet Take 1 tablet by mouth once daily 90 tablet 1  . lisinopril-hydrochlorothiazide (ZESTORETIC) 10-12.5 MG tablet Take 1 tablet by mouth once daily 90 tablet 1  . omeprazole (PRILOSEC) 20 MG capsule Take 1 capsule by mouth once daily 90 capsule 1  . rosuvastatin (CRESTOR) 10 MG tablet Take 1 tablet by mouth once daily 90 tablet 1   No facility-administered medications prior to visit.    Allergies  Allergen Reactions  . Lorazepam Other (See Comments)    hallucinations  . Nsaids Other (See Comments)    Hx of chronic recurrent GI ulcer.   . Onion Nausea And Vomiting  . Pravastatin Other (See Comments)    myalgias  . Simvastatin Other (See Comments)    myalgias    ROS Review of Systems    Objective:    Physical Exam Constitutional:      Appearance: He is well-developed.  HENT:     Head: Normocephalic and atraumatic.  Cardiovascular:     Rate and Rhythm: Normal rate and regular rhythm.     Heart sounds: Normal heart sounds.  Pulmonary:     Effort: Pulmonary effort is normal.     Breath sounds: Normal breath sounds.  Skin:    General: Skin is warm and dry.  Neurological:     Mental Status: He is alert and oriented to person, place, and time.  Psychiatric:        Behavior: Behavior normal.     BP 136/71   Pulse 76   Ht 6\' 2"  (1.88 m)   Wt 252 lb (114.3 kg)   SpO2 98%   BMI 32.35 kg/m  Wt Readings from Last 3 Encounters:  03/11/20 252 lb (114.3 kg)  08/16/19 244 lb (110.7 kg)  04/04/19 248 lb (112.5 kg)     There are no preventive care reminders to display for this patient.  There are no preventive care reminders to  display for this patient.  Lab Results  Component Value  Date   TSH 1.252 10/13/2012   Lab Results  Component Value Date   WBC 4.1 04/04/2018   HGB 14.3 04/04/2018   HCT 40.8 04/04/2018   MCV 95.6 04/04/2018   PLT 226 04/04/2018   Lab Results  Component Value Date   NA 140 08/16/2019   K 4.9 08/16/2019   CO2 28 08/16/2019   GLUCOSE 98 08/16/2019   BUN 27 (H) 08/16/2019   CREATININE 1.27 (H) 08/16/2019   BILITOT 0.5 03/08/2019   ALKPHOS 104 04/10/2016   AST 23 03/08/2019   ALT 26 03/08/2019   PROT 7.1 03/08/2019   ALBUMIN 4.5 04/10/2016   CALCIUM 9.4 08/16/2019   Lab Results  Component Value Date   CHOL 176 03/08/2019   Lab Results  Component Value Date   HDL 37 (L) 03/08/2019   Lab Results  Component Value Date   LDLCALC 99 03/08/2019   Lab Results  Component Value Date   TRIG 302 (H) 03/08/2019   Lab Results  Component Value Date   CHOLHDL 4.8 03/08/2019   No results found for: HGBA1C    Assessment & Plan:   Problem List Items Addressed This Visit      Cardiovascular and Mediastinum   Essential hypertension   Relevant Medications   lisinopril-hydrochlorothiazide (ZESTORETIC) 10-12.5 MG tablet   rosuvastatin (CRESTOR) 10 MG tablet   Other Relevant Orders   COMPLETE METABOLIC PANEL WITH GFR   Lipid Panel w/reflex Direct LDL   PSA     Respiratory   COPD (chronic obstructive pulmonary disease) (HCC)    No recent flares or exacerbations.  Due for updated spirometry please schedule the next couple of months.        Genitourinary   CKD (chronic kidney disease) stage 3, GFR 30-59 ml/min (HCC)    Following renal function every 6 months.      Relevant Orders   COMPLETE METABOLIC PANEL WITH GFR     Other   Hyperlipidemia    Tolerating statin well without any significant side effects.  Refills sent to pharmacy.      Relevant Medications   lisinopril-hydrochlorothiazide (ZESTORETIC) 10-12.5 MG tablet   rosuvastatin (CRESTOR) 10 MG tablet    Other Relevant Orders   COMPLETE METABOLIC PANEL WITH GFR   Lipid Panel w/reflex Direct LDL   PSA   Depression    Able on citalopram.  Would like to continue medication for now.  Low up in 6 months.      Relevant Medications   citalopram (CELEXA) 20 MG tablet    Other Visit Diagnoses    Need for immunization against influenza    -  Primary   Relevant Orders   Flu Vaccine QUAD High Dose(Fluad) (Completed)   Screening for prostate cancer       Relevant Orders   COMPLETE METABOLIC PANEL WITH GFR   Lipid Panel w/reflex Direct LDL   PSA      Meds ordered this encounter  Medications  . lisinopril-hydrochlorothiazide (ZESTORETIC) 10-12.5 MG tablet    Sig: Take 1 tablet by mouth daily.    Dispense:  90 tablet    Refill:  1  . citalopram (CELEXA) 20 MG tablet    Sig: Take 1 tablet (20 mg total) by mouth daily.    Dispense:  90 tablet    Refill:  1  . omeprazole (PRILOSEC) 20 MG capsule    Sig: Take 1 capsule (20 mg total) by mouth daily.    Dispense:  90 capsule  Refill:  1  . rosuvastatin (CRESTOR) 10 MG tablet    Sig: Take 1 tablet (10 mg total) by mouth daily.    Dispense:  90 tablet    Refill:  2    Follow-up: Return in about 6 months (around 09/09/2020) for Hypertension and mood.    Beatrice Lecher, MD

## 2020-03-11 NOTE — Assessment & Plan Note (Signed)
No recent flares or exacerbations.  Due for updated spirometry please schedule the next couple of months.

## 2020-03-11 NOTE — Assessment & Plan Note (Addendum)
Tolerating statin well without any significant side effects.  Refills sent to pharmacy.

## 2020-03-12 LAB — COMPLETE METABOLIC PANEL WITH GFR
AG Ratio: 1.7 (calc) (ref 1.0–2.5)
ALT: 36 U/L (ref 9–46)
AST: 23 U/L (ref 10–35)
Albumin: 4.4 g/dL (ref 3.6–5.1)
Alkaline phosphatase (APISO): 98 U/L (ref 35–144)
BUN/Creatinine Ratio: 21 (calc) (ref 6–22)
BUN: 26 mg/dL — ABNORMAL HIGH (ref 7–25)
CO2: 26 mmol/L (ref 20–32)
Calcium: 9.1 mg/dL (ref 8.6–10.3)
Chloride: 107 mmol/L (ref 98–110)
Creat: 1.23 mg/dL (ref 0.70–1.25)
GFR, Est African American: 69 mL/min/{1.73_m2} (ref >=60)
GFR, Est Non African American: 60 mL/min/{1.73_m2} (ref >=60)
Globulin: 2.6 g/dL (calc) (ref 1.9–3.7)
Glucose, Bld: 93 mg/dL (ref 65–139)
Potassium: 4.5 mmol/L (ref 3.5–5.3)
Sodium: 142 mmol/L (ref 135–146)
Total Bilirubin: 0.5 mg/dL (ref 0.2–1.2)
Total Protein: 7 g/dL (ref 6.1–8.1)

## 2020-03-12 LAB — PSA: PSA: 1.53 ng/mL (ref <=4.0)

## 2020-03-12 LAB — LIPID PANEL W/REFLEX DIRECT LDL
Cholesterol: 148 mg/dL
HDL: 37 mg/dL — ABNORMAL LOW
LDL Cholesterol (Calc): 81 mg/dL
Non-HDL Cholesterol (Calc): 111 mg/dL
Total CHOL/HDL Ratio: 4 (calc)
Triglycerides: 199 mg/dL — ABNORMAL HIGH

## 2020-05-30 ENCOUNTER — Other Ambulatory Visit: Payer: Self-pay | Admitting: Family Medicine

## 2020-08-28 ENCOUNTER — Ambulatory Visit (INDEPENDENT_AMBULATORY_CARE_PROVIDER_SITE_OTHER): Payer: Medicare Other | Admitting: Family Medicine

## 2020-08-28 DIAGNOSIS — Z0000 Encounter for general adult medical examination without abnormal findings: Secondary | ICD-10-CM | POA: Diagnosis not present

## 2020-08-28 NOTE — Patient Instructions (Addendum)
[place] Maintenance Summary and Written Plan of Care  [name] ,  Thank you for allowing me to perform your Medicare Annual Wellness Visit and for your ongoing commitment to your health.   Health Maintenance & Immunization History Health Maintenance  Topic Date Due  . INFLUENZA VACCINE  12/23/2020  . TETANUS/TDAP  01/15/2021  . COLONOSCOPY (Pts 45-67yrs Insurance coverage will need to be confirmed)  04/03/2022  . COVID-19 Vaccine  Completed  . Hepatitis C Screening  Completed  . PNA vac Low Risk Adult  Completed  . HPV VACCINES  Aged Out   Immunization History  Administered Date(s) Administered  . Fluad Quad(high Dose 65+) 03/08/2019, 03/11/2020  . Influenza Split 02/23/2012  . Influenza Whole 03/05/2010, 01/16/2011  . Influenza, High Dose Seasonal PF 03/17/2016, 03/02/2017, 03/22/2018  . Influenza,inj,Quad PF,6+ Mos 01/12/2014, 02/04/2015  . Influenza-Unspecified 04/10/2013  . Moderna SARS-COV2 Booster Vaccination 03/20/2020  . Moderna Sars-Covid-2 Vaccination 06/20/2019, 07/18/2019  . Pneumococcal Conjugate-13 06/13/2015  . Pneumococcal Polysaccharide-23 04/01/2010, 09/01/2016  . Tdap 01/16/2011  . Zoster 01/01/2011    These are the patient goals that we discussed: Goals Addressed              This Visit's Progress   .  Patient Stated (pt-stated)        08/28/2020 AWV Goal: Exercise for General Health   Patient will verbalize understanding of the benefits of increased physical activity:  Exercising regularly is important. It will improve your overall fitness, flexibility, and endurance.  Regular exercise also will improve your overall health. It can help you control your weight, reduce stress, and improve your bone density.  Over the next year, patient will increase physical activity as tolerated with a goal of at least 150 minutes of moderate physical activity per week.   You can tell that you are exercising at a moderate  intensity if your heart starts beating faster and you start breathing faster but can still hold a conversation.  Moderate-intensity exercise ideas include:  Walking 1 mile (1.6 km) in about 15 minutes  Biking  Hiking  Golfing  Dancing  Water aerobics  Patient will verbalize understanding of everyday activities that increase physical activity by providing examples like the following: ? Yard work, such as: ? Pushing a Conservation officer, nature ? Raking and bagging leaves ? Washing your car ? Pushing a stroller ? Shoveling snow ? Gardening ? Washing windows or floors  Patient will be able to explain general safety guidelines for exercising:   Before you start a new exercise program, talk with your health care provider.  Do not exercise so much that you hurt yourself, feel dizzy, or get very short of breath.  Wear comfortable clothes and wear shoes with good support.  Drink plenty of water while you exercise to prevent dehydration or heat stroke.  Work out until your breathing and your heartbeat get faster.         This is a list of Health Maintenance Items that are overdue or due now: Shingrix  Orders/Referrals Placed Today: No orders of the defined types were placed in this encounter.  (Contact our referral department at [phone] if you have not spoken with someone about your referral appointment within the next 5 days)    Follow-up Plan . Follow-up with [name], MD as planned . Discuss Shingrix with Dr. Madilyn Fireman . Medicare wellness exam in one year.  Health Maintenance, Male Adopting a healthy lifestyle and getting preventive care are  important in promoting health and wellness. Ask your health care provider about:  The right schedule for you to have regular tests and exams.  Things you can do on your own to prevent diseases and keep yourself healthy. What should I know about diet, weight, and exercise? Eat a healthy diet  Eat a diet that includes  plenty of vegetables, fruits, low-fat dairy products, and lean protein.  Do not eat a lot of foods that are high in solid fats, added sugars, or sodium.   Maintain a healthy weight Body mass index (BMI) is a measurement that can be used to identify possible weight problems. It estimates body fat based on height and weight. Your health care provider can help determine your BMI and help you achieve or maintain a healthy weight. Get regular exercise Get regular exercise. This is one of the most important things you can do for your health. Most adults should:  Exercise for at least 150 minutes each week. The exercise should increase your heart rate and make you sweat (moderate-intensity exercise).  Do strengthening exercises at least twice a week. This is in addition to the moderate-intensity exercise.  Spend less time sitting. Even light physical activity can be beneficial. Watch cholesterol and blood lipids Have your blood tested for lipids and cholesterol at 71 years of age, then have this test every 5 years. You may need to have your cholesterol levels checked more often if:  Your lipid or cholesterol levels are high.  You are older than 71 years of age.  You are at high risk for heart disease. What should I know about cancer screening? Many types of cancers can be detected early and may often be prevented. Depending on your health history and family history, you may need to have cancer screening at various ages. This may include screening for:  Colorectal cancer.  Prostate cancer.  Skin cancer.  Lung cancer. What should I know about heart disease, diabetes, and high blood pressure? Blood pressure and heart disease  High blood pressure causes heart disease and increases the risk of stroke. This is more likely to develop in people who have high blood pressure readings, are of African descent, or are overweight.  Talk with your health care provider about your target blood pressure  readings.  Have your blood pressure checked: ? Every 3-5 years if you are 50-73 years of age. ? Every year if you are 82 years old or older.  If you are between the ages of 32 and 62 and are a current or former smoker, ask your health care provider if you should have a one-time screening for abdominal aortic aneurysm (AAA). Diabetes Have regular diabetes screenings. This checks your fasting blood sugar level. Have the screening done:  Once every three years after age 70 if you are at a normal weight and have a low risk for diabetes.  More often and at a younger age if you are overweight or have a high risk for diabetes. What should I know about preventing infection? Hepatitis B If you have a higher risk for hepatitis B, you should be screened for this virus. Talk with your health care provider to find out if you are at risk for hepatitis B infection. Hepatitis C Blood testing is recommended for:  Everyone born from [birth year] through 1966.  Anyone with known risk factors for hepatitis C. Sexually transmitted infections (STIs)  You should be screened each year for STIs, including gonorrhea and chlamydia, if: ? You are  sexually active and are younger than 71 years of age. ? You are older than 71 years of age and your health care provider tells you that you are at risk for this type of infection. ? Your sexual activity has changed since you were last screened, and you are at increased risk for chlamydia or gonorrhea. Ask your health care provider if you are at risk.  Ask your health care provider about whether you are at high risk for HIV. Your health care provider may recommend a prescription medicine to help prevent HIV infection. If you choose to take medicine to prevent HIV, you should first get tested for HIV. You should then be tested every 3 months for as long as you are taking the medicine. Follow these instructions at home: Lifestyle  Do not use any products that contain nicotine or  tobacco, such as cigarettes, e-cigarettes, and chewing tobacco. If you need help quitting, ask your health care provider.  Do not use street drugs.  Do not share needles.  Ask your health care provider for help if you need support or information about quitting drugs. Alcohol use  Do not drink alcohol if your health care provider tells you not to drink.  If you drink alcohol: ? Limit how much you have to 0-2 drinks a day. ? Be aware of how much alcohol is in your drink. In the [place] one drink equals one 12 oz bottle of beer (355 mL), one 5 oz glass of wine (148 mL), or one 1 oz glass of hard liquor (44 mL). General instructions  Schedule regular health, dental, and eye exams.  Stay current with your vaccines.  Tell your health care provider if: ? You often feel depressed. ? You have ever been abused or do not feel safe at home. Summary  Adopting a healthy lifestyle and getting preventive care are important in promoting health and wellness.  Follow your health care provider's instructions about healthy diet, exercising, and getting tested or screened for diseases.  Follow your health care provider's instructions on monitoring your cholesterol and blood pressure. This information is not intended to replace advice given to you by your health care provider. Make sure you discuss any questions you have with your health care provider. Document Revised: 05/04/2018 Document Reviewed: 05/04/2018 Elsevier Patient Education  2021 [employer].
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
## 2020-09-06 ENCOUNTER — Other Ambulatory Visit: Payer: Self-pay | Admitting: Family Medicine

## 2020-09-06 DIAGNOSIS — I10 Essential (primary) hypertension: Secondary | ICD-10-CM

## 2020-09-06 DIAGNOSIS — F325 Major depressive disorder, single episode, in full remission: Secondary | ICD-10-CM

## 2020-09-09 ENCOUNTER — Ambulatory Visit: Payer: Medicare Other | Admitting: Family Medicine

## 2020-09-12 ENCOUNTER — Ambulatory Visit (INDEPENDENT_AMBULATORY_CARE_PROVIDER_SITE_OTHER): Payer: Medicare Other | Admitting: Family Medicine

## 2020-09-12 ENCOUNTER — Encounter: Payer: Self-pay | Admitting: Family Medicine

## 2020-09-12 VITALS — BP 132/76 | HR 88 | Ht 74.0 in | Wt 250.0 lb

## 2020-09-12 DIAGNOSIS — K219 Gastro-esophageal reflux disease without esophagitis: Secondary | ICD-10-CM

## 2020-09-12 DIAGNOSIS — F325 Major depressive disorder, single episode, in full remission: Secondary | ICD-10-CM

## 2020-09-12 DIAGNOSIS — J410 Simple chronic bronchitis: Secondary | ICD-10-CM

## 2020-09-12 DIAGNOSIS — N1831 Chronic kidney disease, stage 3a: Secondary | ICD-10-CM

## 2020-09-12 DIAGNOSIS — Z23 Encounter for immunization: Secondary | ICD-10-CM

## 2020-09-12 DIAGNOSIS — I10 Essential (primary) hypertension: Secondary | ICD-10-CM

## 2020-09-12 MED ORDER — CITALOPRAM HYDROBROMIDE 20 MG PO TABS: 20.0000 mg | ORAL_TABLET | Freq: Every day | ORAL | 0 refills | Status: DC

## 2020-09-12 MED ORDER — ANORO ELLIPTA 62.5-25 MCG/INH IN AEPB: INHALATION_SPRAY | RESPIRATORY_TRACT | 0 refills | Status: DC

## 2020-09-12 MED ORDER — OMEPRAZOLE 20 MG PO CPDR: 1.0000 | DELAYED_RELEASE_CAPSULE | Freq: Every day | ORAL | 0 refills | Status: DC

## 2020-09-12 MED ORDER — ZOSTER VAC RECOMB ADJUVANTED 50 MCG/0.5ML IM SUSR: 0.5000 mL | Freq: Once | INTRAMUSCULAR | 0 refills | Status: AC

## 2020-09-12 MED ORDER — TETANUS-DIPHTH-ACELL PERTUSSIS 5-2.5-18.5 LF-MCG/0.5 IM SUSP: 0.5000 mL | Freq: Once | INTRAMUSCULAR | 0 refills | Status: AC

## 2020-09-12 MED ORDER — LISINOPRIL-HYDROCHLOROTHIAZIDE 10-12.5 MG PO TABS: 1.0000 | ORAL_TABLET | Freq: Every day | ORAL | 0 refills | Status: DC

## 2020-09-12 NOTE — Assessment & Plan Note (Signed)
Stable. No recent flares that did have bronchitis about a week aog.
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
## 2020-09-12 NOTE — Assessment & Plan Note (Signed)
Recheck renal function Q 6 mo

## 2020-09-12 NOTE — Assessment & Plan Note (Signed)
Stable on citalopram. Continue current regimen. F/U 6 mo .

## 2020-09-12 NOTE — Assessment & Plan Note (Signed)
Well controlled. Continue current regimen. Follow up in  6 mo  

## 2020-09-13 LAB — BASIC METABOLIC PANEL WITH GFR
BUN/Creatinine Ratio: 19 (calc) (ref 6–22)
BUN: 24 mg/dL (ref 7–25)
CO2: 28 mmol/L (ref 20–32)
Calcium: 9.3 mg/dL (ref 8.6–10.3)
Chloride: 100 mmol/L (ref 98–110)
Creat: 1.27 mg/dL — ABNORMAL HIGH (ref 0.70–1.18)
GFR, Est African American: 66 mL/min/{1.73_m2} (ref >=60)
GFR, Est Non African American: 57 mL/min/{1.73_m2} — ABNORMAL LOW (ref >=60)
Glucose, Bld: 86 mg/dL (ref 65–99)
Potassium: 4.4 mmol/L (ref 3.5–5.3)
Sodium: 137 mmol/L (ref 135–146)

## 2020-11-27 DIAGNOSIS — N323 Diverticulum of bladder: Secondary | ICD-10-CM | POA: Diagnosis not present

## 2020-11-27 DIAGNOSIS — G928 Other toxic encephalopathy: Secondary | ICD-10-CM | POA: Diagnosis not present

## 2020-11-27 DIAGNOSIS — Z0389 Encounter for observation for other suspected diseases and conditions ruled out: Secondary | ICD-10-CM | POA: Diagnosis not present

## 2020-11-27 DIAGNOSIS — Z9889 Other specified postprocedural states: Secondary | ICD-10-CM | POA: Diagnosis not present

## 2020-11-27 DIAGNOSIS — K861 Other chronic pancreatitis: Secondary | ICD-10-CM | POA: Diagnosis not present

## 2020-11-27 DIAGNOSIS — J449 Chronic obstructive pulmonary disease, unspecified: Secondary | ICD-10-CM | POA: Diagnosis not present

## 2020-11-27 DIAGNOSIS — Z903 Acquired absence of stomach [part of]: Secondary | ICD-10-CM | POA: Diagnosis not present

## 2020-11-27 DIAGNOSIS — K632 Fistula of intestine: Secondary | ICD-10-CM | POA: Diagnosis not present

## 2020-11-27 DIAGNOSIS — B370 Candidal stomatitis: Secondary | ICD-10-CM | POA: Diagnosis not present

## 2020-11-27 DIAGNOSIS — R188 Other ascites: Secondary | ICD-10-CM | POA: Diagnosis not present

## 2020-11-27 DIAGNOSIS — R162 Hepatomegaly with splenomegaly, not elsewhere classified: Secondary | ICD-10-CM | POA: Diagnosis not present

## 2020-11-27 DIAGNOSIS — I129 Hypertensive chronic kidney disease with stage 1 through stage 4 chronic kidney disease, or unspecified chronic kidney disease: Secondary | ICD-10-CM | POA: Diagnosis not present

## 2020-11-27 DIAGNOSIS — N281 Cyst of kidney, acquired: Secondary | ICD-10-CM | POA: Diagnosis not present

## 2020-11-27 DIAGNOSIS — K862 Cyst of pancreas: Secondary | ICD-10-CM | POA: Diagnosis not present

## 2020-11-27 DIAGNOSIS — N189 Chronic kidney disease, unspecified: Secondary | ICD-10-CM | POA: Diagnosis not present

## 2020-11-27 DIAGNOSIS — R109 Unspecified abdominal pain: Secondary | ICD-10-CM | POA: Diagnosis not present

## 2020-11-27 DIAGNOSIS — R1011 Right upper quadrant pain: Secondary | ICD-10-CM | POA: Diagnosis not present

## 2020-11-27 DIAGNOSIS — N400 Enlarged prostate without lower urinary tract symptoms: Secondary | ICD-10-CM | POA: Diagnosis not present

## 2020-11-27 DIAGNOSIS — Z980 Intestinal bypass and anastomosis status: Secondary | ICD-10-CM | POA: Diagnosis not present

## 2020-11-27 DIAGNOSIS — K222 Esophageal obstruction: Secondary | ICD-10-CM | POA: Diagnosis not present

## 2020-11-27 DIAGNOSIS — I10 Essential (primary) hypertension: Secondary | ICD-10-CM | POA: Diagnosis not present

## 2020-11-27 DIAGNOSIS — K5730 Diverticulosis of large intestine without perforation or abscess without bleeding: Secondary | ICD-10-CM | POA: Diagnosis not present

## 2020-11-27 DIAGNOSIS — Z20822 Contact with and (suspected) exposure to covid-19: Secondary | ICD-10-CM | POA: Diagnosis not present

## 2020-11-27 DIAGNOSIS — Z8719 Personal history of other diseases of the digestive system: Secondary | ICD-10-CM | POA: Diagnosis not present

## 2020-11-27 DIAGNOSIS — E785 Hyperlipidemia, unspecified: Secondary | ICD-10-CM | POA: Diagnosis not present

## 2020-11-27 DIAGNOSIS — K449 Diaphragmatic hernia without obstruction or gangrene: Secondary | ICD-10-CM | POA: Diagnosis not present

## 2020-11-27 DIAGNOSIS — R4182 Altered mental status, unspecified: Secondary | ICD-10-CM | POA: Diagnosis not present

## 2020-11-27 DIAGNOSIS — K3189 Other diseases of stomach and duodenum: Secondary | ICD-10-CM | POA: Diagnosis not present

## 2020-11-27 DIAGNOSIS — R000 Tachycardia, unspecified: Secondary | ICD-10-CM | POA: Diagnosis not present

## 2020-11-27 DIAGNOSIS — Z9049 Acquired absence of other specified parts of digestive tract: Secondary | ICD-10-CM | POA: Diagnosis not present

## 2020-11-27 DIAGNOSIS — Z9181 History of falling: Secondary | ICD-10-CM | POA: Diagnosis not present

## 2020-11-27 DIAGNOSIS — K858 Other acute pancreatitis without necrosis or infection: Secondary | ICD-10-CM | POA: Diagnosis not present

## 2020-11-27 DIAGNOSIS — K859 Acute pancreatitis without necrosis or infection, unspecified: Secondary | ICD-10-CM | POA: Diagnosis not present

## 2020-11-27 DIAGNOSIS — K8050 Calculus of bile duct without cholangitis or cholecystitis without obstruction: Secondary | ICD-10-CM | POA: Diagnosis not present

## 2020-11-27 DIAGNOSIS — N179 Acute kidney failure, unspecified: Secondary | ICD-10-CM | POA: Diagnosis not present

## 2020-11-27 DIAGNOSIS — E870 Hyperosmolality and hypernatremia: Secondary | ICD-10-CM | POA: Diagnosis not present

## 2020-11-27 DIAGNOSIS — K8591 Acute pancreatitis with uninfected necrosis, unspecified: Secondary | ICD-10-CM | POA: Diagnosis not present

## 2020-12-06 DIAGNOSIS — D72829 Elevated white blood cell count, unspecified: Secondary | ICD-10-CM | POA: Diagnosis not present

## 2020-12-06 DIAGNOSIS — Z4803 Encounter for change or removal of drains: Secondary | ICD-10-CM | POA: Diagnosis not present

## 2020-12-06 DIAGNOSIS — K8689 Other specified diseases of pancreas: Secondary | ICD-10-CM | POA: Diagnosis not present

## 2020-12-06 DIAGNOSIS — K863 Pseudocyst of pancreas: Secondary | ICD-10-CM | POA: Diagnosis not present

## 2020-12-06 DIAGNOSIS — I700 Atherosclerosis of aorta: Secondary | ICD-10-CM | POA: Diagnosis not present

## 2020-12-06 DIAGNOSIS — K8581 Other acute pancreatitis with uninfected necrosis: Secondary | ICD-10-CM | POA: Diagnosis not present

## 2020-12-06 DIAGNOSIS — N183 Chronic kidney disease, stage 3 unspecified: Secondary | ICD-10-CM | POA: Diagnosis not present

## 2020-12-06 DIAGNOSIS — R0602 Shortness of breath: Secondary | ICD-10-CM | POA: Diagnosis not present

## 2020-12-06 DIAGNOSIS — R7989 Other specified abnormal findings of blood chemistry: Secondary | ICD-10-CM | POA: Diagnosis not present

## 2020-12-06 DIAGNOSIS — K651 Peritoneal abscess: Secondary | ICD-10-CM | POA: Diagnosis not present

## 2020-12-06 DIAGNOSIS — K858 Other acute pancreatitis without necrosis or infection: Secondary | ICD-10-CM | POA: Diagnosis not present

## 2020-12-06 DIAGNOSIS — N179 Acute kidney failure, unspecified: Secondary | ICD-10-CM | POA: Diagnosis not present

## 2020-12-06 DIAGNOSIS — J449 Chronic obstructive pulmonary disease, unspecified: Secondary | ICD-10-CM | POA: Diagnosis not present

## 2020-12-06 DIAGNOSIS — R918 Other nonspecific abnormal finding of lung field: Secondary | ICD-10-CM | POA: Diagnosis not present

## 2020-12-06 DIAGNOSIS — J439 Emphysema, unspecified: Secondary | ICD-10-CM | POA: Diagnosis not present

## 2020-12-06 DIAGNOSIS — I10 Essential (primary) hypertension: Secondary | ICD-10-CM | POA: Diagnosis not present

## 2020-12-06 DIAGNOSIS — K760 Fatty (change of) liver, not elsewhere classified: Secondary | ICD-10-CM | POA: Diagnosis not present

## 2020-12-06 DIAGNOSIS — Z4659 Encounter for fitting and adjustment of other gastrointestinal appliance and device: Secondary | ICD-10-CM | POA: Diagnosis not present

## 2020-12-06 DIAGNOSIS — K859 Acute pancreatitis without necrosis or infection, unspecified: Secondary | ICD-10-CM | POA: Diagnosis not present

## 2020-12-06 DIAGNOSIS — K862 Cyst of pancreas: Secondary | ICD-10-CM | POA: Diagnosis not present

## 2020-12-06 DIAGNOSIS — R531 Weakness: Secondary | ICD-10-CM | POA: Diagnosis not present

## 2020-12-06 DIAGNOSIS — K8502 Idiopathic acute pancreatitis with infected necrosis: Secondary | ICD-10-CM | POA: Diagnosis not present

## 2020-12-06 DIAGNOSIS — L03311 Cellulitis of abdominal wall: Secondary | ICD-10-CM | POA: Diagnosis not present

## 2020-12-06 DIAGNOSIS — A419 Sepsis, unspecified organism: Secondary | ICD-10-CM | POA: Diagnosis not present

## 2020-12-06 DIAGNOSIS — K9189 Other postprocedural complications and disorders of digestive system: Secondary | ICD-10-CM | POA: Diagnosis not present

## 2020-12-06 DIAGNOSIS — Z9049 Acquired absence of other specified parts of digestive tract: Secondary | ICD-10-CM | POA: Diagnosis not present

## 2020-12-06 DIAGNOSIS — K219 Gastro-esophageal reflux disease without esophagitis: Secondary | ICD-10-CM | POA: Diagnosis not present

## 2020-12-06 DIAGNOSIS — E785 Hyperlipidemia, unspecified: Secondary | ICD-10-CM | POA: Diagnosis not present

## 2020-12-06 DIAGNOSIS — K8050 Calculus of bile duct without cholangitis or cholecystitis without obstruction: Secondary | ICD-10-CM | POA: Diagnosis not present

## 2020-12-06 DIAGNOSIS — R509 Fever, unspecified: Secondary | ICD-10-CM | POA: Diagnosis not present

## 2020-12-06 DIAGNOSIS — K9181 Other intraoperative complications of digestive system: Secondary | ICD-10-CM | POA: Diagnosis not present

## 2020-12-06 DIAGNOSIS — R000 Tachycardia, unspecified: Secondary | ICD-10-CM | POA: Diagnosis not present

## 2020-12-06 DIAGNOSIS — Z9884 Bariatric surgery status: Secondary | ICD-10-CM | POA: Diagnosis not present

## 2020-12-06 DIAGNOSIS — L02211 Cutaneous abscess of abdominal wall: Secondary | ICD-10-CM | POA: Diagnosis not present

## 2020-12-06 DIAGNOSIS — K567 Ileus, unspecified: Secondary | ICD-10-CM | POA: Diagnosis not present

## 2020-12-06 DIAGNOSIS — J9601 Acute respiratory failure with hypoxia: Secondary | ICD-10-CM | POA: Diagnosis not present

## 2020-12-06 DIAGNOSIS — Z8719 Personal history of other diseases of the digestive system: Secondary | ICD-10-CM | POA: Diagnosis not present

## 2020-12-06 DIAGNOSIS — R0902 Hypoxemia: Secondary | ICD-10-CM | POA: Diagnosis not present

## 2020-12-06 DIAGNOSIS — R1084 Generalized abdominal pain: Secondary | ICD-10-CM | POA: Diagnosis not present

## 2020-12-06 DIAGNOSIS — K8582 Other acute pancreatitis with infected necrosis: Secondary | ICD-10-CM | POA: Diagnosis not present

## 2020-12-06 DIAGNOSIS — K8591 Acute pancreatitis with uninfected necrosis, unspecified: Secondary | ICD-10-CM | POA: Diagnosis not present

## 2020-12-06 DIAGNOSIS — K8501 Idiopathic acute pancreatitis with uninfected necrosis: Secondary | ICD-10-CM | POA: Diagnosis not present

## 2021-01-08 DIAGNOSIS — D649 Anemia, unspecified: Secondary | ICD-10-CM | POA: Diagnosis not present

## 2021-01-08 DIAGNOSIS — K8591 Acute pancreatitis with uninfected necrosis, unspecified: Secondary | ICD-10-CM | POA: Diagnosis not present

## 2021-01-08 DIAGNOSIS — S31109S Unspecified open wound of abdominal wall, unspecified quadrant without penetration into peritoneal cavity, sequela: Secondary | ICD-10-CM | POA: Diagnosis not present

## 2021-01-08 DIAGNOSIS — R7989 Other specified abnormal findings of blood chemistry: Secondary | ICD-10-CM | POA: Diagnosis not present

## 2021-01-08 DIAGNOSIS — R5381 Other malaise: Secondary | ICD-10-CM | POA: Diagnosis not present

## 2021-01-09 DIAGNOSIS — S31109D Unspecified open wound of abdominal wall, unspecified quadrant without penetration into peritoneal cavity, subsequent encounter: Secondary | ICD-10-CM | POA: Diagnosis not present

## 2021-01-09 DIAGNOSIS — Z4803 Encounter for change or removal of drains: Secondary | ICD-10-CM | POA: Diagnosis not present

## 2021-01-09 DIAGNOSIS — J449 Chronic obstructive pulmonary disease, unspecified: Secondary | ICD-10-CM | POA: Diagnosis not present

## 2021-01-09 DIAGNOSIS — I129 Hypertensive chronic kidney disease with stage 1 through stage 4 chronic kidney disease, or unspecified chronic kidney disease: Secondary | ICD-10-CM | POA: Diagnosis not present

## 2021-01-09 DIAGNOSIS — F418 Other specified anxiety disorders: Secondary | ICD-10-CM | POA: Diagnosis not present

## 2021-01-09 DIAGNOSIS — E785 Hyperlipidemia, unspecified: Secondary | ICD-10-CM | POA: Diagnosis not present

## 2021-01-09 DIAGNOSIS — Z48815 Encounter for surgical aftercare following surgery on the digestive system: Secondary | ICD-10-CM | POA: Diagnosis not present

## 2021-01-09 DIAGNOSIS — K859 Acute pancreatitis without necrosis or infection, unspecified: Secondary | ICD-10-CM | POA: Diagnosis not present

## 2021-01-09 DIAGNOSIS — N183 Chronic kidney disease, stage 3 unspecified: Secondary | ICD-10-CM | POA: Diagnosis not present

## 2021-01-11 DIAGNOSIS — K8502 Idiopathic acute pancreatitis with infected necrosis: Secondary | ICD-10-CM | POA: Diagnosis not present

## 2021-01-11 DIAGNOSIS — R509 Fever, unspecified: Secondary | ICD-10-CM | POA: Diagnosis not present

## 2021-01-11 DIAGNOSIS — K863 Pseudocyst of pancreas: Secondary | ICD-10-CM | POA: Diagnosis not present

## 2021-01-11 DIAGNOSIS — Z20822 Contact with and (suspected) exposure to covid-19: Secondary | ICD-10-CM | POA: Diagnosis not present

## 2021-01-11 DIAGNOSIS — R109 Unspecified abdominal pain: Secondary | ICD-10-CM | POA: Diagnosis not present

## 2021-01-12 DIAGNOSIS — K8502 Idiopathic acute pancreatitis with infected necrosis: Secondary | ICD-10-CM | POA: Diagnosis not present

## 2021-01-12 DIAGNOSIS — L988 Other specified disorders of the skin and subcutaneous tissue: Secondary | ICD-10-CM | POA: Diagnosis not present

## 2021-01-13 DIAGNOSIS — K8502 Idiopathic acute pancreatitis with infected necrosis: Secondary | ICD-10-CM | POA: Diagnosis not present

## 2021-01-14 DIAGNOSIS — K8502 Idiopathic acute pancreatitis with infected necrosis: Secondary | ICD-10-CM | POA: Diagnosis not present

## 2021-01-23 ENCOUNTER — Telehealth: Payer: Self-pay | Admitting: Lab

## 2021-01-23 NOTE — Chronic Care Management (AMB) (Signed)
  Chronic Care Management   Note  01/23/2021 Name: Daril Oliveria MRN: [id number] DOB: [birth year]  Arvle Prideaux is a 70 y.o. year old male who is a primary care patient of [name], Rene Kocher, MD. I reached out to [name] by phone today in response to a referral sent by Mr. Devendra Whirley's PCP, [name], MD.   Mr. Arnet was given information about Chronic Care Management services today including:  CCM service includes personalized support from designated clinical staff supervised by his physician, including individualized plan of care and coordination with other care providers 24/7 contact phone numbers for assistance for urgent and routine care needs. Service will only be billed when office clinical staff spend 20 minutes or more in a month to coordinate care. Only one practitioner may furnish and bill the service in a calendar month. The patient may stop CCM services at any time (effective at the end of the month) by phone call to the office staff.   Patient agreed to services and verbal consent obtained.   Follow up plan:   [place]

## 2021-01-29 DIAGNOSIS — K859 Acute pancreatitis without necrosis or infection, unspecified: Secondary | ICD-10-CM | POA: Diagnosis not present

## 2021-01-29 DIAGNOSIS — K861 Other chronic pancreatitis: Secondary | ICD-10-CM | POA: Diagnosis not present

## 2021-01-30 ENCOUNTER — Telehealth: Payer: Self-pay | Admitting: *Deleted

## 2021-01-30 DIAGNOSIS — K8502 Idiopathic acute pancreatitis with infected necrosis: Secondary | ICD-10-CM | POA: Diagnosis not present

## 2021-01-30 DIAGNOSIS — K9189 Other postprocedural complications and disorders of digestive system: Secondary | ICD-10-CM | POA: Diagnosis not present

## 2021-01-30 NOTE — Telephone Encounter (Signed)
Letter for Jury duty Referral to Dr. Maggie Font for f/u of pancreatitis

## 2021-02-24 ENCOUNTER — Other Ambulatory Visit: Payer: Self-pay | Admitting: Family Medicine

## 2021-02-24 DIAGNOSIS — E7800 Pure hypercholesterolemia, unspecified: Secondary | ICD-10-CM

## 2021-02-26 ENCOUNTER — Telehealth: Payer: Self-pay

## 2021-02-26 NOTE — Progress Notes (Signed)
Chronic Care Management Pharmacy Assistant   Name: Christopher [name]  MRN: [id number] DOB: [birth year]  Christopher [name] is an 70 y.o. year old male who presents for his initial CCM visit with the clinical pharmacist.   Recent office visits:  09/12/20 Christopher Marry MD - Seen for hypertension - Labs ordered -  [name] medication changes noted  - Follow up in 6 months  08/28/20 Christopher Marry MD - Seen for medicare annual wellness exam - [name] medication changes noted  - Follow up in 1 year   Recent consult visits:  01/30/21 Christopher Mallow NP - Telemedicine - Infectious disease specialty clinic - Seen for idiopathic acute pancreatitis - [name] medication changes noted - [name] follow up noted  01/29/21  Christopher [name] - Radiology - Telemedicine - Seen for Chronic pancreatitis - CT scan ordered - [name] medication changes noted - [name] follow up noted  12/06/20 Christopher [name] and Christopher [name], Christopher [name] - [name] notes available    Hospital visits:   Admitted to the hospital on 01/11/21 due to Abdominal discomfort. Discharge date was unknown. Discharged from [place]?Medications Started at [facility] Discharge:?? Unable to view document   Medication Changes at Hospital Discharge: Unable to view document   Medications Discontinued at Hospital Discharge: Unable to view document   Medications that remain the same after Hospital Discharge:??  -All other medications will remain the same.    Admitted to the hospital on 12/06/20 due to idiopathic acute pancreatitis. Discharge date was 01/03/21. Discharged from [place]?Medications Started at [facility] Discharge:?? -started  acetaminophen 500 mg tablet Take 2 Tablets (1,000 mg) by mouth 3 times daily. Max acetaminophen dose: 4000mg  in 24 hrs.  amoxicillin-clavulanate (875 MG-125 MG) tablet, Take 1 Tablet by mouth in the morning and 1 Tablet in the evening. Take with meals. Do all this for 28  days.  Medication Changes at Hospital Discharge: -Changed  albuterol HFA 90 mcg/actuation inhaler, Inhale 2 Puffs by mouth 4 times daily if needed for Shortness Of Breath. Anoro Ellipta 62.5-25 mcg/actuation inhaler, Inhale 1 Puff by mouth once daily. Discard inhaler 6 weeks after opening or when the counter reads '0'  Medications Discontinued at Hospital Discharge: N/a  Medications that remain the same after Hospital Discharge:??  -All other medications will remain the same.    Admitted to the hospital on 11/27/20 due to Necrotizing pain. Discharge date was Unknown. Discharged from [employer]?Medications Started at [facility] Discharge:?? N/a  Medication Changes at Hospital Discharge: N/a  Medications Discontinued at Hospital Discharge: N/a  Medications that remain the same after Hospital Discharge:??  -All other medications will remain the same.    Medications: Outpatient Encounter Medications as of 02/26/2021  Medication Sig   albuterol (PROAIR HFA) 108 (90 Base) MCG/ACT inhaler Inhale 2 puffs into the lungs every 6 (six) hours as needed for wheezing.   citalopram (CELEXA) 20 MG tablet Take 1 tablet (20 mg total) by mouth daily.   lisinopril-hydrochlorothiazide (ZESTORETIC) 10-12.5 MG tablet Take 1 tablet by mouth daily.   Multiple Vitamin (MULTIVITAMIN) tablet Take 1 tablet by mouth daily.   omeprazole (PRILOSEC) 20 MG capsule Take 1 capsule (20 mg total) by mouth daily.   rosuvastatin (CRESTOR) 10 MG tablet Take 1 tablet (10 mg total) by mouth daily. **PATIENT NEEDS OFFICE VISIT FOR ADDITIONAL REFILLS**   umeclidinium-vilanterol (ANORO ELLIPTA) 62.5-25 MCG/INH AEPB Inhale 1 puff by  mouth once daily   [name] facility-administered encounter medications on file as of 02/26/2021.    Care Gaps: Zoster Vaccines- Shingrix Overdue - never done COVID-19 Vaccine (4 - Booster for Moderna series) Last completed: Mar 20, 2020 INFLUENZA VACCINE Last completed:  Mar 11, 2020 TETANUS/TDAP Last completed: Jan 16, 2011   albuterol [facility]) [id number] Base) MCG/ACT inhaler (Expired) - Last fill 01/02/2021 17 DS citalopram (CELEXA) 20 MG tablet - Last fill 02/24/2021 90 DS lisinopril-hydrochlorothiazide (ZESTORETIC) 10-12.5 MG tablet - Last fill 02/24/2021 90 DS Multiple Vitamin (MULTIVITAMIN) tablet - Last fill 06/17/18 omeprazole (PRILOSEC) 20 MG capsule - Last fill 02/24/2021 90 DS rosuvastatin (CRESTOR) 10 MG tablet - Last fill 02/24/2021 30 DS  umeclidinium-vilanterol (ANORO ELLIPTA) 62.5-25 MCG/INH AEPB  - Last fill 02/24/2021 90 DS    Star Rating Drugs: rosuvastatin (CRESTOR) 10 MG tablet - Last fill 02/24/2021 30 DS  lisinopril-hydrochlorothiazide (ZESTORETIC) 10-12.5 MG tablet - Last fill 02/24/2021 90 DS    Christopher [name], CMA

## 2021-03-06 ENCOUNTER — Other Ambulatory Visit: Payer: Self-pay

## 2021-03-06 ENCOUNTER — Ambulatory Visit (INDEPENDENT_AMBULATORY_CARE_PROVIDER_SITE_OTHER): Payer: Medicare Other | Admitting: Pharmacist

## 2021-03-06 DIAGNOSIS — I10 Essential (primary) hypertension: Secondary | ICD-10-CM

## 2021-03-06 DIAGNOSIS — E782 Mixed hyperlipidemia: Secondary | ICD-10-CM

## 2021-03-06 DIAGNOSIS — J410 Simple chronic bronchitis: Secondary | ICD-10-CM

## 2021-03-06 NOTE — Patient Instructions (Signed)
Hi [place] and [place],  It was great chatting with you today! Below is the link to download and print the form for Anoro ellipta cost assistance. If you have trouble, I will leave a hard copy at the front desk just in case. Please fill out & return the forms, then we will get them sent away. Have a great weekend and I will be in touch in about a month!  [email or web address]  Larinda Buttery, PharmD Clinical Pharmacist [facility] [phone]   Visit Information   PATIENT GOALS:   Goals Addressed             This Visit's Progress    Medication Management       Patient Goals/Self-Care Activities Over the next 30 days, patient will:  take medications as prescribed and collaborate with provider on medication access solutions  Follow Up Plan: Telephone follow up appointment with care management team member scheduled for:  1 month         Consent to CCM Services: Mr. Hanna was given information about Chronic Care Management services including:  CCM service includes personalized support from designated clinical staff supervised by his physician, including individualized plan of care and coordination with other care providers 24/7 contact phone numbers for assistance for urgent and routine care needs. Service will only be billed when office clinical staff spend 20 minutes or more in a month to coordinate care. Only one practitioner may furnish and bill the service in a calendar month. The patient may stop CCM services at any time (effective at the end of the month) by phone call to the office staff. The patient will be responsible for cost sharing (co-pay) of up to 20% of the service fee (after annual deductible is met).  Patient agreed to services and verbal consent obtained.   Patient verbalizes understanding of instructions provided today and agrees to view in [place].    Telephone follow up appointment with care management team member scheduled for: 1 month  [place]: Patient Care Plan: Medication Management     Problem Identified: HTN, HLD, COPD      Long-Range Goal: Disease Progression Prevention   Start Date: 03/06/2021  This Visit's Progress: On track  Priority: High  Note:   Current Barriers:  Unable to independently afford treatment regimen Anoro Ellipta   Pharmacist Clinical Goal(s):  Over the next 30 days, patient will adhere to plan to optimize therapeutic regimen for chronic conditions as evidenced by report of adherence to recommended medication management changes through collaboration with PharmD and provider.   Interventions: 1:1 collaboration with [name], MD regarding development and update of comprehensive plan of care as evidenced by provider attestation and co-signature Inter-disciplinary care team collaboration (see longitudinal plan of care) Comprehensive medication review performed; medication list updated in electronic medical record  Hypertension:  Controlled; current treatment:lisinopril-hctz 10-12.38m daily taking 1/2 tablet;   Current home readings: 139/90, previously very soft, in SBP 100s when in hospital and recovery  Denies hypotensive/hypertensive symptoms  Counseled on appropriate BP goals Recommended continue current regimen,  Hyperlipidemia:  Controlled; current treatment:rosuvastatin 195mdaily; LDL 81  Medications previously tried: pravastatin, simvastatin   Recommended continue current regimen Chronic Obstructive Pulmonary Disease:  Controlled; current treatment:Anoro Ellipta daily, albuterol PRN;   0 exacerbations requiring treatment in the last 6 months   Recommended continue current regimen Assessed patient finances. Patient is eligible for anoro ellipta patient assistance based on brief  discussion regarding finances, will facilitate leaving paperwork at  front office for patient to complete and return.  Patient Goals/Self-Care Activities Over the next 30 days, patient will:  take medications as prescribed and collaborate with provider on medication access solutions  Follow Up Plan: Telephone follow up appointment with care management team member scheduled for:  1 month

## 2021-03-06 NOTE — Progress Notes (Signed)
Chronic [name] Management Pharmacy Note  03/06/2021 Name:  Christopher [name] MRN:  [id number] DOB:  1951/03/19  Summary: addressed HTN, HLD, COPD. Patient was instructed to take 1/2 tablet of lisinopril-hctz 10-12.72m daily after hospital stay due to softer pressures. Patient reports less frequent dizziness upon standing since reduced, and BP checks daily at home range SBP 130-140s. Patient also admits to spacing out Anoro inhaler usage due to cost.  Recommendations/Changes made from today's visit: will facilitate Anoro ellipta patient assistance - providing PDF link in the instructions for mychart, and if issues downoading, will leave copy at front desk for patient & daughter to pick up, fill out, and return.  Plan: f/u with pharmacist in 1 month  Subjective: Christopher [name] an 70y.o. year old male who is Christopher primary patient of Christopher [name], Christopher [name].  The CCM team was consulted for assistance with disease management and [name] coordination needs.    Engaged with patient by telephone for initial visit in response to provider referral for pharmacy case management and/or [name] coordination services.   Consent to Services:  The patient was given information about Chronic [name] Management services, agreed to services, and gave verbal consent prior to initiation of services.  Please see initial visit note for detailed documentation.   Patient [name] Team: Christopher [name] as PCP - General Dr CCarlis Abbott(Gastroenterology) [facility] Pharmac[facility] (Pharmac[facility])   Recent office visits:  09/12/20 Christopher [name] - Seen for hypertension - Labs ordered -  No medication changes noted  - Follow up in 6 months  08/28/20 Christopher [name] - Seen for medicare annual wellness exam - No medication changes noted  - Follow up in 1 year    Recent consult visits:  01/30/21 Christopher [name] - Telemedicine - Infectious disease specialty clinic - Seen for idiopathic acute pancreatitis - No  medication changes noted - No follow up noted  01/29/21 Christopher [name] - Radiology - Telemedicine - Seen for Chronic pancreatitis - CT scan ordered - No medication changes noted - No follow up noted  12/06/20 Christopher [name] 10 phillips - No notes available      Hospital visits:    Admitted to the hospital on 01/11/21 due to Abdominal discomfort. Discharge date was unknown. [facility] to view [facility]    Medication Changes at Hospital Discharge: Unable to view document    [name] Discontinued at Hospital Discharge: Unable to view document    [name] that remain the same after Hospital Discharge:??  -All other [name] will remain the same.     Admitted to the hospital on 12/06/20 due to idiopathic acute pancreatitis. Discharge date was 01/03/21. [name] -started  acetaminophen 500 mg tablet Take 2 Tablets (1,000 mg) by mouth 3 times daily. Max acetaminophen dose: 40014min 24 hrs.  amoxicillin-clavulanate (875 MG-125 MG) tablet, Take 1 Tablet by mouth in the morning and 1 Tablet in the evening. Take with meals. Do all this for 28 days.   Medication Changes at Hospital Discharge: -Changed  albuterol HFA 90 mcg/actuation inhaler, Inhale 2 Puffs by mouth 4 times daily if needed for Shortness Of Breath. Anoro Ellipta 62.5-25 mcg/actuation inhaler, Inhale 1 Puff by mouth once daily. Discard inhaler 6 weeks after opening or when the counter reads '0'  [name] Discontinued at Hospital Discharge: N/Christopher   [name] that remain the same after Hospital Discharge:??  -All other [name] will remain the same.     Admitted to the hospital on 11/27/20 due to Necrotizing pain. Discharge date was Unknown. [facility]charged from Christopher [name] Discharge:?? N/Christopher   Medication Changes at Hospital Discharge: N/Christopher   [name] Discontinued at Hospital Discharge: N/Christopher   [name] that remain the same after Hospital Discharge:??  -All other [name] will remain the same.    Objective:  Lab Results  Component Value Date   CREATININE 1.27 (H) 09/12/2020   CREATININE 1.23 03/11/2020   CREATININE 1.27 (H) 08/16/2019    No results found for: HGBA1C Last diabetic Eye exam: No results found for: HMDIABEYEEXA  Last diabetic Foot exam: No results found for: HMDIABFOOTEX      Component Value Date/Time   CHOL 148 03/11/2020 0949   TRIG 199 (H) 03/11/2020 0949   HDL 37 (L) 03/11/2020 0949   CHOLHDL 4.0 03/11/2020 0949   VLDL 17 04/10/2016 0853   LDLCALC 81 03/11/2020 0949    Hepatic Function Latest Ref Rng & Units 03/11/2020 03/08/2019 04/04/2018  Total Protein 6.1 - 8.1 g/dL 7.0 7.1 7.0  Albumin 3.6 - 5.1 g/dL - - -  AST 10 - 35 U/L '23 23 20  ' ALT 9 - 46 U/L 36 26 26  Alk Phosphatase 40 - 115 U/L - - -  Total Bilirubin 0.2 - 1.2 mg/dL 0.5 0.5 0.7    Lab Results  Component Value Date/Time   TSH 1.252 10/13/2012 09:41 AM    CBC Latest Ref Rng & Units 04/04/2018 01/24/2014 10/13/2012  WBC 3.8 - 10.8 Thousand/uL 4.1 4.3 8.6  Hemoglobin 13.2 - 17.1 g/dL 14.3 14.6 12.9(L)  Hematocrit 38.5 - 50.0 % 40.8 42.1 37.6(L)  Platelets 140 - 400 Thousand/uL 226 205 443(H)    Clinical ASCVD:  The 10-year ASCVD risk score (Arnett DK, et al., 2019) is: 16.8%   Values used to calculate the score:     Age: 34 years     Sex: Male     Is Non-Hispanic African American: No     Diabetic: No     Tobacco smoker: No     Systolic Blood Pressure: 203 mmHg     Is BP treated: Yes     HDL Cholesterol: 37 mg/dL     Total Cholesterol: 148 mg/dL     Social History   Tobacco Use  Smoking Status Former  Smokeless Tobacco Current   Types: Chew  Tobacco Comments   Quit 15 years ago   BP Readings  from Last 3 Encounters:  09/12/20 132/76  03/11/20 128/68  08/16/19 122/70   Pulse Readings from Last 3 Encounters:  09/12/20 88  03/11/20 68  08/16/19 87   Wt Readings from Last 3 Encounters:  09/12/20 250 lb (113.4 kg)  03/11/20 252 lb (114.3 kg)  08/16/19 244 lb (110.7 kg)    Assessment: Review of patient past medical history, allergies, [name], health status, including review of consultants reports, laboratory and other test data, was performed as part of comprehensive evaluation and provision of chronic [name] management services.   SDOH:  (Social Determinants of Health) assessments and interventions performed:    CCM [name] Plan  Allergies  Allergen Reactions   Lorazepam Other (See Comments)    hallucinations   Nsaids Other (See Comments)    Hx of chronic recurrent GI ulcer.  Onion Nausea And Vomiting   Pravastatin Other (See Comments)    myalgias   Simvastatin Other (See Comments)    myalgias    [name] Reviewed Today     Reviewed by Teddy Spike, CMA (Certified Medical Assistant) on 09/12/20 at 1104  Med List Status: <None>   Medication Order Taking? Sig Documenting Provider Last Dose Status Informant  albuterol (PROAIR HFA) 108 (90 Base) MCG/ACT inhaler [id number] Yes Inhale 2 puffs into the lungs every 6 (six) hours as needed for wheezing. [name], [name] Taking Expired 03/07/20 2359   ANORO ELLIPTA 62.5-25 MCG/INH AEPB [id number] Yes Inhale 1 puff by mouth once daily [name], [name] Taking Active   citalopram (CELEXA) 20 MG tablet [id number] Yes Take 1 tablet by mouth once daily [name], [name] Taking Active   lisinopril-hydrochlorothiazide (ZESTORETIC) 10-12.5 MG tablet [id number] Yes Take 1 tablet by mouth once daily [name], [name] Taking Active   Multiple Vitamin (MULTIVITAMIN) tablet [id number] Yes Take 1 tablet by mouth daily. Provider, Historical, [name] Taking Active   omeprazole (PRILOSEC) 20 MG capsule  [id number] Yes Take 1 capsule by mouth once daily [name], [name] Taking Active   rosuvastatin (CRESTOR) 10 MG tablet [id number] Yes Take 1 tablet (10 mg total) by mouth daily. [name], [name] Taking Active             Patient Active Problem List   Diagnosis Date Noted   Chronic GERD 09/12/2020   Synovial cyst 08/16/2019   Acute bilateral low back pain without sciatica 03/08/2019   Aortic aneurysm ([place]) 07/18/2018   High risk for colon cancer 07/14/2018   Family hx of colon cancer 07/14/2018   Pancreatic cyst 07/14/2018   S/P biliary surgery 07/14/2018   Essential hypertension 04/06/2018   CKD (chronic kidney disease) stage 3, GFR 30-59 ml/min ([place]) 04/12/2016   Depression 01/04/2013   Hyperlipidemia 12/31/2011   Obesity 07/07/2011   COPD (chronic obstructive pulmonary disease) ([place]) 01/16/2011   ANEMIA, IRON DEFICIENCY 02/03/2010   PE 02/03/2010   PERFORATION OF INTESTINE 02/03/2010   Chronic skin ulcer ([place]) 02/03/2010    Immunization History  Administered Date(s) Administered   Fluad Quad(high Dose 65+) 03/08/2019, 03/11/2020   Influenza Split 02/23/2012   Influenza Whole 03/05/2010, 01/16/2011   Influenza, High Dose Seasonal PF 03/17/2016, 03/02/2017, 03/22/2018   Influenza,inj,Quad PF,6+ Mos 01/12/2014, 02/04/2015   Influenza-Unspecified 04/10/2013   Moderna SARS-COV2 Booster Vaccination 03/20/2020   Moderna Sars-Covid-2 Vaccination 06/20/2019, 07/18/2019   Pneumococcal Conjugate-13 06/13/2015   Pneumococcal Polysaccharide-23 04/01/2010, 09/01/2016   Tdap 01/16/2011   Zoster, Live 01/01/2011    Conditions to be addressed/monitored: HTN, HLD, and COPD  There are no [name] plans that you recently modified to display for this patient.   Medication Assistance: Application for Anoro ellipta  medication assistance program. in process.  Anticipated assistance start date TBD.  See plan of [name] for additional detail.  Patient's preferred pharmacy  is:  [place] [place] [address] [place] [address] Phone: [phone] Fax: [phone]  [facility] [address] [place] [address] Phone: [phone] Fax: [phone]  Uses pill box? Yes Pt endorses 100% compliance  Follow Up:  Patient agrees to [name] Plan and Follow-up.  Plan: Telephone follow up appointment with [name] management team member scheduled for:  1 month  Larinda Buttery, PharmD Clinical Pharmacist [name]

## 2021-03-14 ENCOUNTER — Encounter: Payer: Self-pay | Admitting: Family Medicine

## 2021-03-14 ENCOUNTER — Ambulatory Visit (INDEPENDENT_AMBULATORY_CARE_PROVIDER_SITE_OTHER): Payer: Medicare Other | Admitting: Family Medicine

## 2021-03-14 ENCOUNTER — Other Ambulatory Visit: Payer: Self-pay

## 2021-03-14 VITALS — BP 120/77 | HR 85 | Ht 74.0 in | Wt 230.0 lb

## 2021-03-14 DIAGNOSIS — Z23 Encounter for immunization: Secondary | ICD-10-CM | POA: Diagnosis not present

## 2021-03-14 DIAGNOSIS — R911 Solitary pulmonary nodule: Secondary | ICD-10-CM

## 2021-03-14 DIAGNOSIS — E7800 Pure hypercholesterolemia, unspecified: Secondary | ICD-10-CM

## 2021-03-14 DIAGNOSIS — I10 Essential (primary) hypertension: Secondary | ICD-10-CM

## 2021-03-14 DIAGNOSIS — F325 Major depressive disorder, single episode, in full remission: Secondary | ICD-10-CM | POA: Diagnosis not present

## 2021-03-14 DIAGNOSIS — D649 Anemia, unspecified: Secondary | ICD-10-CM

## 2021-03-14 DIAGNOSIS — N1831 Chronic kidney disease, stage 3a: Secondary | ICD-10-CM | POA: Diagnosis not present

## 2021-03-14 MED ORDER — ROSUVASTATIN CALCIUM 10 MG PO TABS: 10.0000 mg | ORAL_TABLET | Freq: Every day | ORAL | 3 refills | Status: DC

## 2021-03-14 NOTE — Assessment & Plan Note (Addendum)
Due to recheck lipids.  Continue Crestor at full dose.

## 2021-03-14 NOTE — Assessment & Plan Note (Signed)
Due to recheck renal function. 

## 2021-03-14 NOTE — Assessment & Plan Note (Signed)
Will have him continue to tab half a tab of the lisinopril HCT.  When runs out we can d/c the hct component and continue the lisinopril

## 2021-03-14 NOTE — Assessment & Plan Note (Signed)
-

## 2021-03-14 NOTE — Progress Notes (Signed)
Established Patient Office Visit  Subjective:  Patient ID: Christopher [name], male    DOB: 09/24/1950  Age: 70 y.o. MRN: [id number]  CC:  Chief Complaint  Patient presents with   Follow-up    HPI Christopher [name] presents for   Hypertension- Pt denies chest pain, SOB, dizziness, or heart palpitations.  Taking meds as directed w/o problems.  Denies medication side effects.  Reports that blood pressures at home have actually looked pretty good.  F/U depression - overall doing OK.  He is on citalopram and happy with his regimn.    He did have some questions about his statin that he would like to discuss today.  He is also been having some difficulty sleeping so he has been taking some diphenhydramine 25 mg and just wanted to make sure that it was okay to do this.  He was recently hospitalized in [place] at [facility] for pancreatitis.  During admission he was told to start some calcium, zinc and a multivitamin.  He was admitted from July 15 through August 12 and was discharged home with home health.  He was staying with one of his daughters at the time.  Prior history of antrectomy, duodenectomy and gastrojejunostomy and hepatic Ozempic June and June ostomy.  He has had multiple complications over the years.  CT there showed necrotizing pancreatitis with a large fluid collection.  GI and surgery were consulted.  Drain was placed.  Culture grew out para Bacteroides distasonas and Klebsiella oxytoca.  Placed on Zosyn and Diflucan.  Initially he was septic but did gradually improve and eventually the drains were removed.  He was transitioned to p.o. antibiotics on August 11 and then discharged home.  Him to they did want him to follow-up with surgery again is sometime in November he also recommended that he have a 65-monthfollow-up for CT chest for small pulmonary nodule.  Past Medical History:  Diagnosis Date   Anxiety    Blood transfusion without reported diagnosis    Clotting disorder ([place]     COPD (chronic obstructive pulmonary disease) ([place]    Depression    Gastric ulcer    GERD (gastroesophageal reflux disease)    Hyperlipidemia    Hypertension    Pancreatitis    11/2010. 2011   Stroke ([facility]     Past Surgical History:  Procedure Laterality Date   abdominal reconstructin     X 3   BILE DUCT EXPLORATION     CHOLECYSTECTOMY     Duodenum Stricture     Explorratory Laparotomy     GASTRECTOMY     GASTROJEJUNOSTOMY     LAPAROSCOPIC LYSIS OF ADHESIONS     Pancreatic leak repair     Resection of distal stomach and proximal Roux limb     ROUX-EN-Y GASTRIC BYPASS      Family History  Problem Relation Age of Onset   Hypertension Mother    Heart attack Father    Prostate cancer Father        prostate, lip cancer   Uterine cancer Sister    Lung cancer Brother    Colon cancer Sister 650  Colon cancer Sister 451  Ovarian cancer Sister    Heart attack Daughter    Esophageal cancer Neg Hx    Stomach cancer Neg Hx    Rectal cancer Neg Hx    Inflammatory bowel disease Neg Hx    Liver disease Neg Hx    Pancreatic cancer Neg Hx  Social History   Socioeconomic History   Marital status: Divorced    Spouse name: Not on file   Number of children: 4   Years of education: 12   Highest education level: 12th grade  Occupational History   Occupation: Games developer work    Comment: retired  Tobacco Use   Smoking status: Former   Smokeless tobacco: Current    Types: Chew   Tobacco comments:    Quit 15 years ago  Scientific laboratory technician Use: Never used  Substance and Sexual Activity   Alcohol use: Not Currently   Drug use: No   Sexual activity: Not Currently  Other Topics Concern   Not on file  Social History Narrative   Lives with daughter and grandkids and his step son. Likes to collect rocks and Engineer, structural. Visits [place] for 5-6 months a year.    Social Determinants of Health   Financial Resource Strain: Low Risk    Difficulty of Paying Living Expenses:  Not hard at all  Food Insecurity: No Food Insecurity   Worried About Charity fundraiser in the Last Year: Never true   [place] in the Last Year: Never true  Transportation Needs: No Transportation Needs   Lack of Transportation (Medical): No   Lack of Transportation (Non-Medical): No  Physical Activity: Inactive   Days of Exercise per Week: 0 days   Minutes of Exercise per Session: 0 min  Stress: No Stress Concern Present   Feeling of Stress : Not at all  Social Connections: Socially Isolated   Frequency of Communication with Friends and Family: More than three times a week   Frequency of Social Gatherings with Friends and Family: More than three times a week   Attends Religious Services: Never   Marine scientist or Organizations: No   Attends Music therapist: Never   Marital Status: Divorced  Human resources officer Violence: Not At Risk   Fear of Current or Ex-Partner: No   Emotionally Abused: No   Physically Abused: No   Sexually Abused: No    Outpatient Medications Prior to Visit  Medication Sig Dispense Refill   albuterol (PROAIR HFA) 108 (90 Base) MCG/ACT inhaler Inhale 2 puffs into the lungs every 6 (six) hours as needed for wheezing. 3 g 4   citalopram (CELEXA) 20 MG tablet Take 1 tablet (20 mg total) by mouth daily. 90 tablet 0   lisinopril-hydrochlorothiazide (ZESTORETIC) 10-12.5 MG tablet Take 1 tablet by mouth daily. (Patient taking differently: Take 0.5 tablets by mouth daily.) 90 tablet 0   Multiple Vitamin (MULTIVITAMIN) tablet Take 1 tablet by mouth daily.     omeprazole (PRILOSEC) 20 MG capsule Take 1 capsule (20 mg total) by mouth daily. 90 capsule 0   umeclidinium-vilanterol (ANORO ELLIPTA) 62.5-25 MCG/INH AEPB Inhale 1 puff by mouth once daily 180 each 0   rosuvastatin (CRESTOR) 10 MG tablet Take 1 tablet (10 mg total) by mouth daily. **PATIENT NEEDS OFFICE VISIT FOR ADDITIONAL REFILLS** 30 tablet 0   No facility-administered medications  prior to visit.    Allergies  Allergen Reactions   Lorazepam Other (See Comments)    hallucinations   Nsaids Other (See Comments)    Hx of chronic recurrent GI ulcer.    Onion Nausea And Vomiting   Pravastatin Other (See Comments)    myalgias   Simvastatin Other (See Comments)    myalgias    ROS Review of Systems    Objective:  Physical Exam Constitutional:      Appearance: Normal appearance. He is well-developed.  HENT:     Head: Normocephalic and atraumatic.  Cardiovascular:     Rate and Rhythm: Normal rate and regular rhythm.     Heart sounds: Normal heart sounds.  Pulmonary:     Effort: Pulmonary effort is normal.     Breath sounds: Normal breath sounds.  Skin:    General: Skin is warm and dry.  Neurological:     Mental Status: He is alert and oriented to person, place, and time. Mental status is at baseline.  Psychiatric:        Behavior: Behavior normal.    BP 120/77   Pulse 85   Ht '6\' 2"'  (1.88 m)   Wt 230 lb (104.3 kg)   SpO2 97%   BMI 29.53 kg/m  Wt Readings from Last 3 Encounters:  03/14/21 230 lb (104.3 kg)  09/12/20 250 lb (113.4 kg)  03/11/20 252 lb (114.3 kg)     Health Maintenance Due  Topic Date Due   COVID-19 Vaccine (3 - Booster for Moderna series) 05/15/2020    There are no preventive care reminders to display for this patient.  Lab Results  Component Value Date   TSH 1.252 10/13/2012   Lab Results  Component Value Date   WBC 7.9 03/17/2021   HGB 14.3 03/17/2021   HCT 42.3 03/17/2021   MCV 95.3 03/17/2021   PLT 285 03/17/2021   Lab Results  Component Value Date   NA 141 03/17/2021   K 4.6 03/17/2021   CO2 29 03/17/2021   GLUCOSE 113 (H) 03/17/2021   BUN 18 03/17/2021   CREATININE 1.25 03/17/2021   BILITOT 0.4 03/17/2021   ALKPHOS 104 04/10/2016   AST 19 03/17/2021   ALT 22 03/17/2021   PROT 8.0 03/17/2021   ALBUMIN 4.5 04/10/2016   CALCIUM 9.7 03/17/2021   EGFR 62 03/17/2021   Lab Results  Component Value  Date   CHOL 172 03/17/2021   Lab Results  Component Value Date   HDL 41 03/17/2021   Lab Results  Component Value Date   LDLCALC 102 (H) 03/17/2021   Lab Results  Component Value Date   TRIG 171 (H) 03/17/2021   Lab Results  Component Value Date   CHOLHDL 4.2 03/17/2021   No results found for: HGBA1C    Assessment & Plan:   Problem List Items Addressed This Visit       Cardiovascular and Mediastinum   Essential hypertension - Primary    Will have him continue to tab half a tab of the lisinopril HCT.  When runs out we can d/c the hct component and continue the lisinopril       Relevant Medications   rosuvastatin (CRESTOR) 10 MG tablet   Other Relevant Orders   PSA (Completed)   Lipid panel (Completed)   COMPLETE METABOLIC PANEL WITH GFR (Completed)   CBC (Completed)   Fe+TIBC+Fer (Completed)   B12 (Completed)     Genitourinary   CKD (chronic kidney disease) stage 3, GFR 30-59 ml/min (H[facility])    Due to recheck renal function      Relevant Medications   rosuvastatin (CRESTOR) 10 MG tablet   Other Relevant Orders   PSA (Completed)   Lipid panel (Completed)   COMPLETE METABOLIC PANEL WITH GFR (Completed)   CBC (Completed)   Fe+TIBC+Fer (Completed)   B12 (Completed)     Other   Pulmonary nodule    Recommend follow-up  chest CT in February 2023.      Hyperlipidemia    Due to recheck lipids.  Continue Crestor at full dose.       Relevant Medications   rosuvastatin (CRESTOR) 10 MG tablet   Other Relevant Orders   PSA (Completed)   Lipid panel (Completed)   COMPLETE METABOLIC PANEL WITH GFR (Completed)   CBC (Completed)   Fe+TIBC+Fer (Completed)   B12 (Completed)   Depression    Continue citalopram.       Other Visit Diagnoses     Need for immunization against influenza       Relevant Orders   Flu Vaccine QUAD High Dose(Fluad) (Completed)   Anemia, unspecified type       Relevant Medications   rosuvastatin (CRESTOR) 10 MG tablet   Other  Relevant Orders   PSA (Completed)   Lipid panel (Completed)   COMPLETE METABOLIC PANEL WITH GFR (Completed)   CBC (Completed)   Fe+TIBC+Fer (Completed)   B12 (Completed)       Anemia - plant to recheck iron and B12  Sleep issues-okay to use diphenhydramine 25 mg as needed.  Meds ordered this encounter  Medications   rosuvastatin (CRESTOR) 10 MG tablet    Sig: Take 1 tablet (10 mg total) by mouth daily.    Dispense:  90 tablet    Refill:  3    Follow-up: Return in about 4 months (around 07/15/2021) for Hypertension.   I spent 42 minutes on the day of the encounter to include pre-visit record review, face-to-face time with the patient and post visit ordering of test. e  Beatrice Lecher, MD

## 2021-03-17 DIAGNOSIS — Z125 Encounter for screening for malignant neoplasm of prostate: Secondary | ICD-10-CM | POA: Diagnosis not present

## 2021-03-17 DIAGNOSIS — E7800 Pure hypercholesterolemia, unspecified: Secondary | ICD-10-CM | POA: Diagnosis not present

## 2021-03-17 DIAGNOSIS — I10 Essential (primary) hypertension: Secondary | ICD-10-CM | POA: Diagnosis not present

## 2021-03-17 DIAGNOSIS — D649 Anemia, unspecified: Secondary | ICD-10-CM | POA: Diagnosis not present

## 2021-03-18 ENCOUNTER — Other Ambulatory Visit: Payer: Self-pay | Admitting: *Deleted

## 2021-03-18 ENCOUNTER — Encounter: Payer: Self-pay | Admitting: Family Medicine

## 2021-03-18 DIAGNOSIS — R911 Solitary pulmonary nodule: Secondary | ICD-10-CM | POA: Insufficient documentation

## 2021-03-18 DIAGNOSIS — J410 Simple chronic bronchitis: Secondary | ICD-10-CM

## 2021-03-18 LAB — COMPLETE METABOLIC PANEL WITH GFR
AG Ratio: 1.3 (calc) (ref 1.0–2.5)
ALT: 22 U/L (ref 9–46)
AST: 19 U/L (ref 10–35)
Albumin: 4.5 g/dL (ref 3.6–5.1)
Alkaline phosphatase (APISO): 128 U/L (ref 35–144)
BUN: 18 mg/dL (ref 7–25)
CO2: 29 mmol/L (ref 20–32)
Calcium: 9.7 mg/dL (ref 8.6–10.3)
Chloride: 103 mmol/L (ref 98–110)
Creat: 1.25 mg/dL (ref 0.70–1.28)
Globulin: 3.5 g/dL (calc) (ref 1.9–3.7)
Glucose, Bld: 113 mg/dL — ABNORMAL HIGH (ref 65–99)
Potassium: 4.6 mmol/L (ref 3.5–5.3)
Sodium: 141 mmol/L (ref 135–146)
Total Bilirubin: 0.4 mg/dL (ref 0.2–1.2)
Total Protein: 8 g/dL (ref 6.1–8.1)
eGFR: 62 mL/min/{1.73_m2} (ref >=60)

## 2021-03-18 LAB — CBC
HCT: 42.3 % (ref 38.5–50.0)
Hemoglobin: 14.3 g/dL (ref 13.2–17.1)
MCH: 32.2 pg (ref 27.0–33.0)
MCHC: 33.8 g/dL (ref 32.0–36.0)
MCV: 95.3 fL (ref 80.0–100.0)
MPV: 8.9 fL (ref 7.5–12.5)
Platelets: 285 10*3/uL (ref 140–400)
RBC: 4.44 10*6/uL (ref 4.20–5.80)
RDW: 12.2 % (ref 11.0–15.0)
WBC: 7.9 10*3/uL (ref 3.8–10.8)

## 2021-03-18 LAB — IRON,TIBC AND FERRITIN PANEL
%SAT: 16 % (calc) — ABNORMAL LOW (ref 20–48)
Ferritin: 161 ng/mL (ref 24–380)
Iron: 64 ug/dL (ref 50–180)
TIBC: 396 mcg/dL (calc) (ref 250–425)

## 2021-03-18 LAB — PSA: PSA: 2.14 ng/mL (ref <=4.00)

## 2021-03-18 LAB — LIPID PANEL
Cholesterol: 172 mg/dL (ref <200)
HDL: 41 mg/dL (ref >=40)
LDL Cholesterol (Calc): 102 mg/dL (calc) — ABNORMAL HIGH
Non-HDL Cholesterol (Calc): 131 mg/dL (calc) — ABNORMAL HIGH (ref <130)
Total CHOL/HDL Ratio: 4.2 (calc) (ref <5.0)
Triglycerides: 171 mg/dL — ABNORMAL HIGH (ref <150)

## 2021-03-18 LAB — VITAMIN B12: Vitamin B-12: 351 pg/mL (ref 200–1100)

## 2021-03-18 MED ORDER — UMECLIDINIUM-VILANTEROL 62.5-25 MCG/ACT IN AEPB: INHALATION_SPRAY | RESPIRATORY_TRACT | 3 refills | Status: DC

## 2021-03-18 NOTE — Progress Notes (Signed)
Hi Christopher [name], prostate test is normal.  Recommend repeat 1 year.  Triglycerides look a little better this time compared to last time but LDL slightly elevated.  Are you still taking your Crestor pretty regularly.  We may need to look at adjusting your dose upward, to 20 mg.  Your blood count is normal.  Iron stores are still little bit on the low end.  Encourage you to continue to work on eating iron rich foods and continue with an iron supplement for now.  Your B12 is normal.

## 2021-03-18 NOTE — Assessment & Plan Note (Signed)
Recommend follow-up chest CT in February 2023.

## 2021-03-24 DIAGNOSIS — E782 Mixed hyperlipidemia: Secondary | ICD-10-CM

## 2021-03-24 DIAGNOSIS — J410 Simple chronic bronchitis: Secondary | ICD-10-CM

## 2021-03-24 DIAGNOSIS — I10 Essential (primary) hypertension: Secondary | ICD-10-CM | POA: Diagnosis not present

## 2021-04-01 ENCOUNTER — Ambulatory Visit (INDEPENDENT_AMBULATORY_CARE_PROVIDER_SITE_OTHER): Payer: Medicare Other | Admitting: Pharmacist

## 2021-04-01 ENCOUNTER — Other Ambulatory Visit: Payer: Self-pay

## 2021-04-01 DIAGNOSIS — E7800 Pure hypercholesterolemia, unspecified: Secondary | ICD-10-CM

## 2021-04-01 DIAGNOSIS — J410 Simple chronic bronchitis: Secondary | ICD-10-CM

## 2021-04-01 DIAGNOSIS — I10 Essential (primary) hypertension: Secondary | ICD-10-CM

## 2021-04-01 NOTE — Progress Notes (Signed)
Chronic Care Management Pharmacy Note  04/01/2021 Name:  Kayzen Kendzierski MRN:  [id number] DOB:  03-26-1951  Summary: addressed HTN, HLD, COPD. Patient is taking 1/2 tablet of lisinopril-hctz 10-12.18m daily with good BP control,  agree w/ PCP decision to remove HCTZ component when pt completes current medication supply. Patient also admits to spacing out Anoro inhaler usage due to cost, working on patient assistance paperwork, received letter that company is requesting refax of application, proof of income, insurance card, and proof of $600 out of pocket spend on prescriptions.  Recommendations/Changes made from today's visit: will continue to facilitate Anoro ellipta patient assistance - daughter to provide additional documents as requested & we will fax them out when she brings them to office.  Plan: f/u with pharmacist in 1 month  Subjective: BFermon Uretais an 70y.o. year old male who is a primary patient of [name], CRene Kocher MD.  The CCM team was consulted for assistance with disease management and care coordination needs.    Engaged with patient by telephone for follow up visit in response to provider referral for pharmacy case management and/or care coordination services.   Consent to Services:  The patient was given information about Chronic Care Management services, agreed to services, and gave verbal consent prior to initiation of services.  Please see initial visit note for detailed documentation.   Patient Care Team: MHali Marry MD as PCP - General Dr CCarlis Abbott(Gastroenterology) KDarius Bump RSamaritan North Surgery Center Ltdas Pharmacist (Pharmacist)   Recent office visits:  09/12/20 CHali MarryMD - Seen for hypertension - Labs ordered -  No medication changes noted  - Follow up in 6 months  08/28/20 CHali MarryMD - Seen for medicare annual wellness exam - No medication changes noted  - Follow up in 1 year    Recent consult visits:  01/30/21 KLucas MallowNP -  Telemedicine - Infectious disease specialty clinic - Seen for idiopathic acute pancreatitis - No medication changes noted - No follow up noted  01/29/21 JRaleigh LionsPA - Radiology - Telemedicine - Seen for Chronic pancreatitis - CT scan ordered - No medication changes noted - No follow up noted  12/06/20 [name], Physician 10 phillips - No notes available      Hospital visits:    Admitted to the hospital on 01/11/21 due to Abdominal discomfort. Discharge date was unknown. [facility]    Medication Changes at Hospital Discharge: Unable to view document    Medications Discontinued at Hospital Discharge: Unable to view document    Medications that remain the same after Hospital Discharge:??  -All other medications will remain the same.     Admitted to the hospital on 12/06/20 due to idiopathic acute pancreatitis. Discharge date was 01/03/21. [facility] -[facility]  acetaminophen 500 mg tablet Take 2 Tablets (1,000 mg) by mouth 3 times daily. Max acetaminophen dose: 40023min 24 hrs.  amoxicillin-clavulanate (875 MG-125 MG) tablet, Take 1 Tablet by mouth in the morning and 1 Tablet in the evening. Take with meals. Do all this for 28 days.   Medication Changes at Hospital Discharge: -Changed  albuterol HFA 90 mcg/actuation inhaler, Inhale 2 Puffs by mouth 4 times daily if needed for Shortness Of Breath. Anoro Ellipta 62.5-25 mcg/actuation inhaler, Inhale 1 Puff by mouth  once daily. Discard inhaler 6 weeks after opening or when the counter reads '0'   Medications Discontinued at Hospital Discharge: N/a   Medications that remain the same after Hospital Discharge:??  -All other medications will remain the same.     Admitted to the hospital on 11/27/20 due to Necrotizing pain.  Discharge date was Unknown. Discharged from [place] Discharge:?? N/a   Medication Changes at Hospital Discharge: N/a   Medications Discontinued at Hospital Discharge: N/a   Medications that remain the same after Hospital Discharge:??  -All other medications will remain the same.    Objective:  Lab Results  Component Value Date   CREATININE 1.25 03/17/2021   CREATININE 1.27 (H) 09/12/2020   CREATININE 1.23 03/11/2020    No results found for: HGBA1C Last diabetic Eye exam: No results found for: HMDIABEYEEXA  Last diabetic Foot exam: No results found for: HMDIABFOOTEX      Component Value Date/Time   CHOL 172 03/17/2021 0929   TRIG 171 (H) 03/17/2021 0929   HDL 41 03/17/2021 0929   CHOLHDL 4.2 03/17/2021 0929   VLDL 17 04/10/2016 0853   LDLCALC 102 (H) 03/17/2021 0929    Hepatic Function Latest Ref Rng & Units 03/17/2021 03/11/2020 03/08/2019  Total Protein 6.1 - 8.1 g/dL 8.0 7.0 7.1  Albumin 3.6 - 5.1 g/dL - - -  AST 10 - 35 U/L '19 23 23  ' ALT 9 - 46 U/L 22 36 26  Alk Phosphatase 40 - 115 U/L - - -  Total Bilirubin 0.2 - 1.2 mg/dL 0.4 0.5 0.5    Lab Results  Component Value Date/Time   TSH 1.252 10/13/2012 09:41 AM    CBC Latest Ref Rng & Units 03/17/2021 04/04/2018 01/24/2014  WBC 3.8 - 10.8 Thousand/uL 7.9 4.1 4.3  Hemoglobin 13.2 - 17.1 g/dL 14.3 14.3 14.6  Hematocrit 38.5 - 50.0 % 42.3 40.8 42.1  Platelets 140 - 400 Thousand/uL 285 226 205    Clinical ASCVD:  The 10-year ASCVD risk score (Arnett DK, et al., 2019) is: 19%   Values used to calculate the score:     Age: 1 years     Sex: Male     Is Non-Hispanic African American: No     Diabetic: No     Tobacco smoker: No     Systolic Blood Pressure: 426 mmHg     Is BP treated: Yes     HDL Cholesterol: 41 mg/dL     Total Cholesterol: 172 mg/dL     Social History   Tobacco Use  Smoking Status Former  Smokeless Tobacco Current    Types: Chew  Tobacco Comments   Quit 15 years ago   BP Readings from Last 3 Encounters:  03/14/21 120/77  09/12/20 132/76  03/11/20 128/68   Pulse Readings from Last 3 Encounters:  03/14/21 85  09/12/20 88  03/11/20 68   Wt Readings from Last 3 Encounters:  03/14/21 230 lb (104.3 kg)  09/12/20 250 lb (113.4 kg)  03/11/20 252 lb (114.3 kg)    Assessment: Review of patient past medical history, allergies, medications, health status, including review of consultants reports, laboratory and other test data, was performed as part of comprehensive evaluation and provision of chronic care management services.   SDOH:  (Social Determinants of Health) assessments and interventions performed:    CCM Care Plan  Allergies  Allergen Reactions   Lorazepam Other (See Comments)    hallucinations  Nsaids Other (See Comments)    Hx of chronic recurrent GI ulcer.    Onion Nausea And Vomiting   Pravastatin Other (See Comments)    myalgias   Simvastatin Other (See Comments)    myalgias    Medications Reviewed Today     Reviewed by [name], MD (Physician) on 03/18/21 at 1715  Med List Status: <None>   Medication Order Taking? Sig Documenting Provider Last Dose Status Informant  albuterol (PROAIR HFA) 108 (90 Base) MCG/ACT inhaler [id number] Yes Inhale 2 puffs into the lungs every 6 (six) hours as needed for wheezing. [name], MD Taking Active   citalopram (CELEXA) 20 MG tablet [id number] Yes Take 1 tablet (20 mg total) by mouth daily. [name], MD Taking Active   lisinopril-hydrochlorothiazide (ZESTORETIC) 10-12.5 MG tablet [id number] Yes Take 1 tablet by mouth daily.  Patient taking differently: Take 0.5 tablets by mouth daily.   [name], MD Taking Active   Multiple Vitamin (MULTIVITAMIN) tablet [id number] Yes Take 1 tablet by mouth daily. [provider] Taking Active   omeprazole (PRILOSEC) 20 MG capsule [id number] Yes Take 1  capsule (20 mg total) by mouth daily. [name], MD Taking Active   rosuvastatin (CRESTOR) 10 MG tablet [id number]  Take 1 tablet (10 mg total) by mouth daily. [name], MD  Active   umeclidinium-vilanterol [facility] ELLIPTA) 62.5-25 MCG/INH AEPB [id number] Yes Inhale 1 puff by mouth once daily [name], MD Taking Active             Patient Active Problem List   Diagnosis Date Noted   Pulmonary nodule 03/18/2021   Chronic GERD 09/12/2020   Synovial cyst 08/16/2019   Acute bilateral low back pain without sciatica 03/08/2019   Aortic aneurysm ([place]) 07/18/2018   High risk for colon cancer 07/14/2018   Family hx of colon cancer 07/14/2018   Pancreatic cyst 07/14/2018   S/P biliary surgery 07/14/2018   Essential hypertension 04/06/2018   CKD (chronic kidney disease) stage 3, GFR 30-59 ml/min ([place]) 04/12/2016   Depression 01/04/2013   Hyperlipidemia 12/31/2011   Obesity 07/07/2011   COPD (chronic obstructive pulmonary disease) ([place]) 01/16/2011   ANEMIA, IRON DEFICIENCY 02/03/2010   PE 02/03/2010   PERFORATION OF INTESTINE 02/03/2010   Chronic skin ulcer ([place]) 02/03/2010    Immunization History  Administered Date(s) Administered   Fluad Quad(high Dose 65+) 03/08/2019, 03/11/2020, 03/14/2021   Influenza Split 02/23/2012   Influenza Whole 03/05/2010, 01/16/2011   Influenza, High Dose Seasonal PF 03/17/2016, 03/02/2017, 03/22/2018   Influenza,inj,Quad PF,6+ Mos 01/12/2014, 02/04/2015   Influenza-Unspecified 04/10/2013   Moderna SARS-COV2 Booster Vaccination 03/20/2020   Moderna Sars-Covid-2 Vaccination 06/20/2019, 07/18/2019   Pneumococcal Conjugate-13 06/13/2015   Pneumococcal Polysaccharide-23 04/01/2010, 09/01/2016   Tdap 01/16/2011, 09/12/2020   Zoster Recombinat (Shingrix) 09/12/2020, 03/04/2021   Zoster, Live 01/01/2011    Conditions to be addressed/monitored: HTN, HLD, and COPD  There are no care plans that you recently modified to  display for this patient.   Medication Assistance: Application for Anoro ellipta  medication assistance program. in process.  Anticipated assistance start date TBD.  See plan of care for additional detail.  Patient's preferred pharmacy is:  [facility], [place] [address] [place] [address] Phone: [phone] Fax: [phone]  [facility] [address] [place] [address] Phone: [phone] Fax: [phone]  Uses pill  box? Yes Pt endorses 100% compliance  Follow Up:  Patient agrees to Care Plan and Follow-up.  Plan: Telephone follow up appointment with care management team member scheduled for:  1 month  Larinda Buttery, PharmD Clinical Pharmacist [facility] [phone]

## 2021-04-01 NOTE — Patient Instructions (Signed)
Visit Information  We will keep an eye out for the additional documents requested by the [employer], and fax them out when they are compiled! Thank you!  Patient verbalizes understanding of instructions provided today and agrees to view in [place].   Telephone follow up appointment with care management team member scheduled for:  Darius Bump

## 2021-04-02 ENCOUNTER — Encounter: Payer: Self-pay | Admitting: Family Medicine

## 2021-04-02 DIAGNOSIS — K8502 Idiopathic acute pancreatitis with infected necrosis: Secondary | ICD-10-CM | POA: Diagnosis not present

## 2021-04-02 DIAGNOSIS — K859 Acute pancreatitis without necrosis or infection, unspecified: Secondary | ICD-10-CM | POA: Diagnosis not present

## 2021-04-23 DIAGNOSIS — E7800 Pure hypercholesterolemia, unspecified: Secondary | ICD-10-CM

## 2021-04-23 DIAGNOSIS — I10 Essential (primary) hypertension: Secondary | ICD-10-CM

## 2021-04-23 DIAGNOSIS — J410 Simple chronic bronchitis: Secondary | ICD-10-CM

## 2021-05-02 DIAGNOSIS — H5213 Myopia, bilateral: Secondary | ICD-10-CM | POA: Diagnosis not present

## 2021-05-09 ENCOUNTER — Ambulatory Visit (INDEPENDENT_AMBULATORY_CARE_PROVIDER_SITE_OTHER): Payer: Medicare Other | Admitting: Pharmacist

## 2021-05-09 ENCOUNTER — Other Ambulatory Visit: Payer: Self-pay

## 2021-05-09 DIAGNOSIS — J410 Simple chronic bronchitis: Secondary | ICD-10-CM

## 2021-05-09 DIAGNOSIS — E7800 Pure hypercholesterolemia, unspecified: Secondary | ICD-10-CM

## 2021-05-09 DIAGNOSIS — I10 Essential (primary) hypertension: Secondary | ICD-10-CM

## 2021-05-09 NOTE — Patient Instructions (Signed)
Visit Information  Thank you for taking time to visit with me today. Please don't hesitate to contact me if I can be of assistance to you before our next scheduled telephone appointment.  Following are the goals we discussed today:  Patient Goals/Self-Care Activities Over the next 30 days, patient will:  take medications as prescribed and collaborate with provider on medication access solutions  Follow Up Plan: Telephone follow up appointment with care management team member scheduled for:  1 month    Please call the care guide team at [phone] if you need to cancel or reschedule your appointment.    Patient verbalizes understanding of instructions provided today and agrees to view in [place].   Christopher [name]

## 2021-05-09 NOTE — Progress Notes (Signed)
Chronic Care Management Pharmacy Note  05/09/2021 Name:  Christopher [name] MRN:  [id number] DOB:  1950/11/01  Summary: addressed HTN, HLD, COPD. All medications are going well, he still has not received any correspondence from [employer] patient assistance company.   Recommendations/Changes made from today's visit:  No changes, sent a request to health concierge to assist in contacting patient assistance company for an update.  Plan: f/u with pharmacist in 1 month  Subjective: Christopher [name] is an 71 y.o. year old male who is a primary patient of [name], Rene Kocher, MD.  The CCM team was consulted for assistance with disease management and care coordination needs.    Engaged with patient by telephone for follow up visit in response to provider referral for pharmacy case management and/or care coordination services.   Consent to Services:  The patient was given information about Chronic Care Management services, agreed to services, and gave verbal consent prior to initiation of services.  Please see initial visit note for detailed documentation.   Patient Care Team: [name], MD as PCP - General Dr Carlis Abbott (Gastroenterology) Darius Bump, [facility] as Pharmacist (Pharmacist)   Recent office visits:  09/12/20 [name] MD - Seen for hypertension - Labs ordered -  No medication changes noted  - Follow up in 6 months  08/28/20 [name] MD - Seen for medicare annual wellness exam - No medication changes noted  - Follow up in 1 year    Recent consult visits:  01/30/21 Lucas Mallow NP - Telemedicine - Infectious disease specialty clinic - Seen for idiopathic acute pancreatitis - No medication changes noted - No follow up noted  01/29/21 Iosco Lions PA - Radiology - Telemedicine - Seen for Chronic pancreatitis - CT scan ordered - No medication changes noted - No follow up noted  12/06/20 Lisa Roca and Disaster, Physician 10 phillips - No notes  available      Hospital visits:    Admitted to the hospital on 01/11/21 due to Abdominal discomfort. Discharge date was unknown. Discharged from [place]Medications Started at [facility] Discharge:?? Unable to view document    Medication Changes at Hospital Discharge: Unable to view document    Medications Discontinued at Hospital Discharge: Unable to view document    Medications that remain the same after Hospital Discharge:??  -All other medications will remain the same.     Admitted to the hospital on 12/06/20 due to idiopathic acute pancreatitis. Discharge date was 01/03/21. Discharged from [place]?Medications Started at [facility] Discharge:?? -started  acetaminophen 500 mg tablet Take 2 Tablets (1,000 mg) by mouth 3 times daily. Max acetaminophen dose: 4040m in 24 hrs.  amoxicillin-clavulanate (875 MG-125 MG) tablet, Take 1 Tablet by mouth in the morning and 1 Tablet in the evening. Take with meals. Do all this for 28 days.   Medication Changes at Hospital Discharge: -Changed  albuterol HFA 90 mcg/actuation inhaler, Inhale 2 Puffs by mouth 4 times daily if needed for Shortness Of Breath. Anoro Ellipta 62.5-25 mcg/actuation inhaler, Inhale 1 Puff by mouth once daily. Discard inhaler 6 weeks after opening or when the counter reads '0'   Medications Discontinued at Hospital Discharge: N/a   Medications that remain the same after Hospital Discharge:??  -All other medications will remain the same.     Admitted to the hospital on 11/27/20 due to Necrotizing pain. Discharge date was Unknown. Discharged  from [place]?Medications Started at [facility] Discharge:?? N/a   Medication Changes at Hospital Discharge: N/a   Medications Discontinued at Hospital Discharge: N/a   Medications that remain the same after Hospital Discharge:??  -All other medications will remain the same.     Objective:  Lab Results  Component Value Date   CREATININE 1.25 03/17/2021   CREATININE 1.27 (H) 09/12/2020   CREATININE 1.23 03/11/2020    No results found for: HGBA1C Last diabetic Eye exam: No results found for: HMDIABEYEEXA  Last diabetic Foot exam: No results found for: HMDIABFOOTEX      Component Value Date/Time   CHOL 172 03/17/2021 0929   TRIG 171 (H) 03/17/2021 0929   HDL 41 03/17/2021 0929   CHOLHDL 4.2 03/17/2021 0929   VLDL 17 04/10/2016 0853   LDLCALC 102 (H) 03/17/2021 0929    Hepatic Function Latest Ref Rng & Units 03/17/2021 03/11/2020 03/08/2019  Total Protein 6.1 - 8.1 g/dL 8.0 7.0 7.1  Albumin 3.6 - 5.1 g/dL - - -  AST 10 - 35 U/L _0 ALT 9 - 46 U/L 22 36 26  Alk Phosphatase 40 - 115 U/L - - -  Total Bilirubin 0.2 - 1.2 mg/dL 0.4 0.5 0.5    Lab Results  Component Value Date/Time   TSH 1.252 10/13/2012 09:41 AM    CBC Latest Ref Rng & Units 03/17/2021 04/04/2018 01/24/2014  WBC 3.8 - 10.8 Thousand/uL 7.9 4.1 4.3  Hemoglobin 13.2 - 17.1 g/dL 14.3 14.3 14.6  Hematocrit 38.5 - 50.0 % 42.3 40.8 42.1  Platelets 140 - 400 Thousand/uL 285 226 205    Clinical ASCVD:  The 10-year ASCVD risk score (Arnett DK, et al., 2019) is: 19.3%   Values used to calculate the score:     Age: 23 years     Sex: Male     Is Non-Hispanic African American: No     Diabetic: No     Tobacco smoker: No     Systolic Blood Pressure: 370 mmHg     Is BP treated: Yes     HDL Cholesterol: 41 mg/dL     Total Cholesterol: 172 mg/dL     Social History   Tobacco Use  Smoking Status Former  Smokeless Tobacco Current   Types: Chew  Tobacco Comments   Quit 15 years ago   BP Readings from Last 3 Encounters:  03/14/21 120/77  09/12/20 132/76  03/11/20 128/68   Pulse Readings from Last 3 Encounters:  03/14/21 85  09/12/20 88  03/11/20 68   Wt Readings from Last 3 Encounters:  03/14/21 230 lb (104.3 kg)  09/12/20 250 lb (113.4 kg)  03/11/20 252 lb (114.3 kg)     Assessment: Review of patient past medical history, allergies, medications, health status, including review of consultants reports, laboratory and other test data, was performed as part of comprehensive evaluation and provision of chronic care management services.   SDOH:  (Social Determinants of Health) assessments and interventions performed:    CCM Care Plan  Allergies  Allergen Reactions   Lorazepam Other (See Comments)    hallucinations   Nsaids Other (See Comments)    Hx of chronic recurrent GI ulcer.    Onion Nausea And Vomiting   Pravastatin Other (See Comments)    myalgias   Simvastatin Other (See Comments)    myalgias    Medications Reviewed Today     Reviewed by Darius Bump, [facility] (Pharmacist)  on 04/01/21 at 1034  Med List Status: <None>   Medication Order Taking? Sig Documenting Provider Last Dose Status Informant  albuterol (PROAIR HFA) 108 (90 Base) MCG/ACT inhaler [id number] Yes Inhale 2 puffs into the lungs every 6 (six) hours as needed for wheezing. [name], MD Taking Active   citalopram (CELEXA) 20 MG tablet [id number] Yes Take 1 tablet (20 mg total) by mouth daily. [name], MD Taking Active   Ferrous Fumarate (IRON) 18 MG TBCR [id number] Yes Take 18 mg by mouth 3 (three) times a week. [provider] Taking Active   lisinopril-hydrochlorothiazide (ZESTORETIC) 10-12.5 MG tablet [id number] Yes Take 1 tablet by mouth daily.  Patient taking differently: Take 0.5 tablets by mouth daily.   [name], MD Taking Active   Multiple Vitamin (MULTIVITAMIN) tablet [id number] Yes Take 1 tablet by mouth daily. [provider] Taking Active   omeprazole (PRILOSEC) 20 MG capsule [id number] Yes Take 1 capsule (20 mg total) by mouth daily. [name], MD Taking Active   rosuvastatin (CRESTOR) 10 MG tablet [id number] Yes Take 1 tablet (10 mg total) by mouth daily. [name], MD Taking Active    umeclidinium-vilanterol [facility] ELLIPTA) 62.5-25 MCG/ACT AEPB [id number] Yes Inhale 1 puff by mouth once daily [name], MD Taking Active             Patient Active Problem List   Diagnosis Date Noted   Pulmonary nodule 03/18/2021   Chronic GERD 09/12/2020   Synovial cyst 08/16/2019   Acute bilateral low back pain without sciatica 03/08/2019   Aortic aneurysm ([place]) 07/18/2018   High risk for colon cancer 07/14/2018   Family hx of colon cancer 07/14/2018   Pancreatic cyst 07/14/2018   S/P biliary surgery 07/14/2018   Essential hypertension 04/06/2018   CKD (chronic kidney disease) stage 3, GFR 30-59 ml/min ([place]) 04/12/2016   Depression 01/04/2013   Hyperlipidemia 12/31/2011   Obesity 07/07/2011   COPD (chronic obstructive pulmonary disease) ([place]) 01/16/2011   ANEMIA, IRON DEFICIENCY 02/03/2010   PE 02/03/2010   PERFORATION OF INTESTINE 02/03/2010   Chronic skin ulcer ([place]) 02/03/2010    Immunization History  Administered Date(s) Administered   Fluad Quad(high Dose 65+) 03/08/2019, 03/11/2020, 03/14/2021   Influenza Split 02/23/2012   Influenza Whole 03/05/2010, 01/16/2011   Influenza, High Dose Seasonal PF 03/17/2016, 03/02/2017, 03/22/2018   Influenza,inj,Quad PF,6+ Mos 01/12/2014, 02/04/2015   Influenza-Unspecified 04/10/2013   Moderna SARS-COV2 Booster Vaccination 03/20/2020   Moderna Sars-Covid-2 Vaccination 06/20/2019, 07/18/2019   Pfizer Covid-19 Vaccine Bivalent Booster 38yr & up 03/17/2021   Pneumococcal Conjugate-13 06/13/2015   Pneumococcal Polysaccharide-23 04/01/2010, 09/01/2016   Tdap 01/16/2011, 09/12/2020   Zoster Recombinat (Shingrix) 09/12/2020, 03/04/2021   Zoster, Live 01/01/2011    Conditions to be addressed/monitored: HTN, HLD, and COPD  Care Plan : Medication Management  Updates made by KDarius Bump RChapinsince 05/09/2021 12:00 AM     Problem: HTN, HLD, COPD      Long-Range Goal: Disease Progression Prevention   Start  Date: 03/06/2021  Recent Progress: On track  Priority: High  Note:   Current Barriers:  Unable to independently afford treatment regimen Anoro Ellipta   Pharmacist Clinical Goal(s):  Over the next 30 days, patient will adhere to plan to optimize therapeutic regimen for chronic conditions as evidenced by report of adherence to recommended medication management changes through collaboration with PharmD and provider.   Interventions: 1:1 collaboration with MHali Marry MD regarding development and update of  comprehensive plan of care as evidenced by provider attestation and co-signature Inter-disciplinary care team collaboration (see longitudinal plan of care) Comprehensive medication review performed; medication list updated in electronic medical record  Hypertension:  Controlled; current treatment:lisinopril-hctz 10-12.2m daily taking 1/2 tablet;   Current home readings: 139/90, previously very soft, in SBP 100s when in hospital and recovery  Denies hypotensive/hypertensive symptoms  Counseled on appropriate BP goals Recommended continue current regimen, agree w/ PCP decision to remove HCTZ component when pt completes current medication supply,  Hyperlipidemia:  Controlled; current treatment:rosuvastatin 19mdaily; LDL 81  Medications previously tried: pravastatin, simvastatin   Recommended continue current regimen Chronic Obstructive Pulmonary Disease:  Controlled; current treatment:Anoro Ellipta daily, albuterol PRN;   0 exacerbations requiring treatment in the last 6 months   Recommended continue current regimen Assessed patient finances. Patient is eligible for anoro ellipta patient assistance, application was faxed, pending a response/update from company.  Patient Goals/Self-Care Activities Over the next 30 days, patient will:  take medications as prescribed and collaborate with provider on medication access solutions  Follow Up Plan: Telephone follow up  appointment with care management team member scheduled for:  1 month       Medication Assistance: Application for Anoro ellipta  medication assistance program. in process.  Anticipated assistance start date TBD.  See plan of care for additional detail.  [facility]:  [place] [place] [place]: [phone]: [phone]: [phone]: [phone] pill box? Yes Pt endorses 100% compliance  Follow Up:  Patient agrees to Care Plan and Follow-up.  Plan: Telephone follow up appointment with care management team member scheduled for:  1 month  KeLarinda ButteryPharmD Clinical Pharmacist [phone]

## 2021-05-21 ENCOUNTER — Other Ambulatory Visit: Payer: Self-pay | Admitting: Family Medicine

## 2021-05-21 ENCOUNTER — Other Ambulatory Visit: Payer: Self-pay | Admitting: *Deleted

## 2021-05-21 DIAGNOSIS — K219 Gastro-esophageal reflux disease without esophagitis: Secondary | ICD-10-CM

## 2021-05-21 DIAGNOSIS — I10 Essential (primary) hypertension: Secondary | ICD-10-CM

## 2021-05-21 DIAGNOSIS — F325 Major depressive disorder, single episode, in full remission: Secondary | ICD-10-CM

## 2021-05-21 MED ORDER — OMEPRAZOLE 20 MG PO CPDR: 20.0000 mg | DELAYED_RELEASE_CAPSULE | Freq: Every day | ORAL | 0 refills | Status: DC

## 2021-05-21 MED ORDER — CITALOPRAM HYDROBROMIDE 20 MG PO TABS: 20.0000 mg | ORAL_TABLET | Freq: Every day | ORAL | 0 refills | Status: DC

## 2021-05-24 DIAGNOSIS — J410 Simple chronic bronchitis: Secondary | ICD-10-CM

## 2021-05-24 DIAGNOSIS — I10 Essential (primary) hypertension: Secondary | ICD-10-CM | POA: Diagnosis not present

## 2021-05-24 DIAGNOSIS — E7800 Pure hypercholesterolemia, unspecified: Secondary | ICD-10-CM | POA: Diagnosis not present

## 2021-06-06 DIAGNOSIS — H524 Presbyopia: Secondary | ICD-10-CM | POA: Diagnosis not present

## 2021-06-19 ENCOUNTER — Ambulatory Visit (INDEPENDENT_AMBULATORY_CARE_PROVIDER_SITE_OTHER): Payer: Medicare Other | Admitting: Pharmacist

## 2021-06-19 ENCOUNTER — Encounter: Payer: Self-pay | Admitting: Pharmacist

## 2021-06-19 ENCOUNTER — Other Ambulatory Visit: Payer: Self-pay

## 2021-06-19 DIAGNOSIS — J410 Simple chronic bronchitis: Secondary | ICD-10-CM

## 2021-06-19 DIAGNOSIS — E7800 Pure hypercholesterolemia, unspecified: Secondary | ICD-10-CM

## 2021-06-19 DIAGNOSIS — I10 Essential (primary) hypertension: Secondary | ICD-10-CM

## 2021-06-19 NOTE — Progress Notes (Signed)
Chronic Care Management Pharmacy Note  06/19/2021 Name:  Christopher [name] MRN:  [id number] DOB:  Jun 29, 1950  Summary: addressed HTN, HLD, COPD. All medications are going well. He has received anoro ellipta supply via patient assistance.    Recommendations/Changes made from today's visit:  - Recommend adjust BP medication to lisinopril 91m (and discontinue  lisinopril -hctz 10-12.583mhalf tablets), so that he does not have to keep splitting tablets  -Will send anoro ellipta application for [id number] mychart message  Plan: f/u with pharmacist in 5-6 months  Subjective: BrDerran [name] an [age] year old male who is a primary patient of [name], Christopher KocherMD.  The CCM team was consulted for assistance with disease management and care coordination needs.    Engaged with patient by telephone for follow up visit in response to provider referral for pharmacy case management and/or care coordination services.   Consent to Services:  The patient was given information about Chronic Care Management services, agreed to services, and gave verbal consent prior to initiation of services.  Please see initial visit note for detailed documentation.   Patient Care Team: [name] as PCP - General Dr Christopher AbbottGastroenterology) [facility] Pharmacist (Pharmacist)   Recent office visits:  09/12/20 [name] - Seen for hypertension - Labs ordered -  No medication changes noted  - Follow up in 6 months  08/28/20 [name] - Seen for medicare annual wellness exam - No medication changes noted  - Follow up in 1 year    Recent consult visits:  01/30/21 Christopher MallowP - Telemedicine - Infectious disease specialty clinic - Seen for idiopathic acute pancreatitis - No medication changes noted - No follow up noted  01/29/21 Christopher LionsA - Radiology - Telemedicine - Seen for Chronic pancreatitis - CT scan ordered - No medication changes noted - No  follow up noted  12/06/20 Christopher [name] Disaster, Physician 10 phillips - No notes available      Hospital visits:    Admitted to the hospital on 01/11/21 due to Abdominal discomfort. Discharge date was unknown. [facility] Unable to view document    Medication Changes at Hospital Discharge: Unable to view document    Medications Discontinued at Hospital Discharge: Unable to view document    Medications that remain the same after Hospital Discharge:??  -All other medications will remain the same.     Admitted to the hospital on 12/06/20 due to idiopathic acute pancreatitis. Discharge date was 01/03/21. [facility] -started  acetaminophen 500 mg tablet Take 2 Tablets (1,000 mg) by mouth 3 times daily. Max acetaminophen dose: 400063mn 24 hrs.  amoxicillin-clavulanate (875 MG-125 MG) tablet, Take 1 Tablet by mouth in the morning and 1 Tablet in the evening. Take with meals. Do all this for 28 days.   Medication Changes at Hospital Discharge: -Changed  albuterol HFA 90 mcg/actuation inhaler, Inhale 2 Puffs by mouth 4 times daily if needed for Shortness Of Breath. Anoro Ellipta 62.5-25 mcg/actuation inhaler, Inhale 1 Puff by mouth once daily. Discard inhaler 6 weeks after opening or when the counter reads '0'   Medications Discontinued at Hospital Discharge: N/a   Medications that remain the same after Hospital Discharge:??  -All other medications will remain the same.  Admitted to the hospital on 11/27/20 due to Necrotizing pain. Discharge date was Unknown. Discharged from [place]?Medications Started at [facility] Discharge:?? N/a   Medication Changes at Hospital Discharge: N/a   Medications Discontinued at Hospital Discharge: N/a   Medications that remain the same  after Hospital Discharge:??  -All other medications will remain the same.    Objective:  Lab Results  Component Value Date   CREATININE 1.25 03/17/2021   CREATININE 1.27 (H) 09/12/2020   CREATININE 1.23 03/11/2020    No results found for: HGBA1C Last diabetic Eye exam: No results found for: HMDIABEYEEXA  Last diabetic Foot exam: No results found for: HMDIABFOOTEX      Component Value Date/Time   CHOL 172 03/17/2021 0929   TRIG 171 (H) 03/17/2021 0929   HDL 41 03/17/2021 0929   CHOLHDL 4.2 03/17/2021 0929   VLDL 17 04/10/2016 0853   LDLCALC 102 (H) 03/17/2021 0929    Hepatic Function Latest Ref Rng & Units 03/17/2021 03/11/2020 03/08/2019  Total Protein 6.1 - 8.1 g/dL 8.0 7.0 7.1  Albumin 3.6 - 5.1 g/dL - - -  AST 10 - 35 U/L '19 23 23  ' ALT 9 - 46 U/L 22 36 26  Alk Phosphatase 40 - 115 U/L - - -  Total Bilirubin 0.2 - 1.2 mg/dL 0.4 0.5 0.5    Lab Results  Component Value Date/Time   TSH 1.252 10/13/2012 09:41 AM    CBC Latest Ref Rng & Units 03/17/2021 04/04/2018 01/24/2014  WBC 3.8 - 10.8 Thousand/uL 7.9 4.1 4.3  Hemoglobin 13.2 - 17.1 g/dL 14.3 14.3 14.6  Hematocrit 38.5 - 50.0 % 42.3 40.8 42.1  Platelets 140 - 400 Thousand/uL 285 226 205    Clinical ASCVD:  The 10-year ASCVD risk score (Arnett DK, et al., 2019) is: 20.6%   Values used to calculate the score:     Age: 71 years     Sex: Male     Is Non-Hispanic African American: No     Diabetic: No     Tobacco smoker: No     Systolic Blood Pressure: 035 mmHg     Is BP treated: Yes     HDL Cholesterol: 41 mg/dL     Total Cholesterol: 172 mg/dL     Social History   Tobacco Use  Smoking Status Former  Smokeless Tobacco Current   Types: Chew  Tobacco Comments   Quit 15 years ago   BP Readings from Last 3 Encounters:  03/14/21 120/77  09/12/20 132/76  03/11/20 128/68   Pulse Readings from Last 3 Encounters:  03/14/21 85  09/12/20 88  03/11/20 68   Wt Readings from Last 3 Encounters:  03/14/21  230 lb (104.3 kg)  09/12/20 250 lb (113.4 kg)  03/11/20 252 lb (114.3 kg)    Assessment: Review of patient past medical history, allergies, medications, health status, including review of consultants reports, laboratory and other test data, was performed as part of comprehensive evaluation and provision of chronic care management services.   SDOH:  (Social Determinants of Health) assessments and interventions performed:    CCM Care Plan  Allergies  Allergen Reactions   Lorazepam Other (See Comments)    hallucinations   Nsaids Other (See Comments)    Hx of chronic recurrent GI ulcer.    Onion Nausea And Vomiting   Pravastatin Other (See Comments)    myalgias   Simvastatin Other (See Comments)    myalgias  Medications Reviewed Today     Reviewed by Darius Bump, [facility] (Pharmacist) on 04/01/21 at 1034  Med List Status: <None>   Medication Order Taking? Sig Documenting Provider Last Dose Status Informant  albuterol (PROAIR HFA) 108 (90 Base) MCG/ACT inhaler [id number] Yes Inhale 2 puffs into the lungs every 6 (six) hours as needed for wheezing. Christopher Marry, MD Taking Active   citalopram (CELEXA) 20 MG tablet [id number] Yes Take 1 tablet (20 mg total) by mouth daily. Christopher Marry, MD Taking Active   Ferrous Fumarate (IRON) 18 MG TBCR [id number] Yes Take 18 mg by mouth 3 (three) times a week. [provider] Taking Active   lisinopril-hydrochlorothiazide (ZESTORETIC) 10-12.5 MG tablet [id number] Yes Take 1 tablet by mouth daily.  Patient taking differently: Take 0.5 tablets by mouth daily.   Christopher Marry, MD Taking Active   Multiple Vitamin (MULTIVITAMIN) tablet [id number] Yes Take 1 tablet by mouth daily. [provider] Taking Active   omeprazole (PRILOSEC) 20 MG capsule [id number] Yes Take 1 capsule (20 mg total) by mouth daily. Christopher Marry, MD Taking Active   rosuvastatin (CRESTOR) 10 MG tablet [id number] Yes Take 1 tablet (10 mg  total) by mouth daily. Christopher Marry, MD Taking Active   umeclidinium-vilanterol [facility] ELLIPTA) 62.5-25 MCG/ACT AEPB [id number] Yes Inhale 1 puff by mouth once daily Christopher Marry, MD Taking Active             Patient Active Problem List   Diagnosis Date Noted   Pulmonary nodule 03/18/2021   Chronic GERD 09/12/2020   Synovial cyst 08/16/2019   Acute bilateral low back pain without sciatica 03/08/2019   Aortic aneurysm ([place]) 07/18/2018   High risk for colon cancer 07/14/2018   Family hx of colon cancer 07/14/2018   Pancreatic cyst 07/14/2018   S/P biliary surgery 07/14/2018   Essential hypertension 04/06/2018   CKD (chronic kidney disease) stage 3, GFR 30-59 ml/min ([place]) 04/12/2016   Depression 01/04/2013   Hyperlipidemia 12/31/2011   Obesity 07/07/2011   COPD (chronic obstructive pulmonary disease) ([place]) 01/16/2011   ANEMIA, IRON DEFICIENCY 02/03/2010   PE 02/03/2010   PERFORATION OF INTESTINE 02/03/2010   Chronic skin ulcer ([place]) 02/03/2010    Immunization History  Administered Date(s) Administered   Fluad Quad(high Dose 65+) 03/08/2019, 03/11/2020, 03/14/2021   Influenza Split 02/23/2012   Influenza Whole 03/05/2010, 01/16/2011   Influenza, High Dose Seasonal PF 03/17/2016, 03/02/2017, 03/22/2018   Influenza,inj,Quad PF,6+ Mos 01/12/2014, 02/04/2015   Influenza-Unspecified 04/10/2013   Moderna SARS-COV2 Booster Vaccination 03/20/2020   Moderna Sars-Covid-2 Vaccination 06/20/2019, 07/18/2019   Pfizer Covid-19 Vaccine Bivalent Booster 28yr & up 03/17/2021   Pneumococcal Conjugate-13 06/13/2015   Pneumococcal Polysaccharide-23 04/01/2010, 09/01/2016   Tdap 01/16/2011, 09/12/2020   Zoster Recombinat (Shingrix) 09/12/2020, 03/04/2021   Zoster, Live 01/01/2011    Conditions to be addressed/monitored: HTN, HLD, and COPD  There are no care plans that you recently modified to display for this patient.     Medication Assistance: Application for Anoro  ellipta  medication assistance program. in process.  Anticipated assistance start date TBD.  See plan of care for additional detail.  Patient's preferred pharmacy is:  [address] [place]Phone: [phone]: [phone] [address] [place]Phone: [phone]: [phone]  Uses pill box? Yes Pt endorses  100% compliance  Follow Up:  Patient agrees to Care Plan and Follow-up.  Plan: Telephone follow up appointment with care management team member scheduled for:  5-6 month  Larinda Buttery, PharmD Clinical Pharmacist [facility] [phone]

## 2021-06-19 NOTE — Patient Instructions (Signed)
Visit Information  Thank you for taking time to visit with me today. Please don't hesitate to contact me if I can be of assistance to you before our next scheduled telephone appointment.  Following are the goals we discussed today:  Patient Goals/Self-Care Activities Over the next 180 days, patient will:  take medications as prescribed and collaborate with provider on medication access solutions  Follow Up Plan: Telephone follow up appointment with care management team member scheduled for:  5-6 months    Please call the care guide team at [phone] if you need to cancel or reschedule your appointment.    Patient verbalizes understanding of instructions and care plan provided today and agrees to view in [place]. Active MyChart status confirmed with patient.    Christopher [name]

## 2021-06-20 MED ORDER — LISINOPRIL 5 MG PO TABS: 5.0000 mg | ORAL_TABLET | Freq: Every day | ORAL | 1 refills | Status: DC

## 2021-06-20 NOTE — Progress Notes (Signed)
Will adjust blood pressure medication as below.  We will switch to just plain lisinopril 5 mg daily.  New prescription sent to pharmacy.  Meds ordered this encounter  Medications   lisinopril (ZESTRIL) 5 MG tablet    Sig: Take 1 tablet (5 mg total) by mouth daily.    Dispense:  90 tablet    Refill:  1

## 2021-06-20 NOTE — Addendum Note (Signed)
Addended by: Beatrice Lecher D on: 06/20/2021 11:32 AM   Modules accepted: Orders

## 2021-06-24 DIAGNOSIS — E7800 Pure hypercholesterolemia, unspecified: Secondary | ICD-10-CM

## 2021-06-24 DIAGNOSIS — J410 Simple chronic bronchitis: Secondary | ICD-10-CM

## 2021-06-24 DIAGNOSIS — I10 Essential (primary) hypertension: Secondary | ICD-10-CM

## 2021-07-15 ENCOUNTER — Encounter: Payer: Self-pay | Admitting: Family Medicine

## 2021-07-15 ENCOUNTER — Ambulatory Visit (INDEPENDENT_AMBULATORY_CARE_PROVIDER_SITE_OTHER): Payer: Medicare Other | Admitting: Family Medicine

## 2021-07-15 ENCOUNTER — Other Ambulatory Visit: Payer: Self-pay

## 2021-07-15 VITALS — BP 135/77 | HR 85 | Resp 16 | Ht 74.0 in | Wt 240.0 lb

## 2021-07-15 DIAGNOSIS — I10 Essential (primary) hypertension: Secondary | ICD-10-CM

## 2021-07-15 DIAGNOSIS — J410 Simple chronic bronchitis: Secondary | ICD-10-CM | POA: Diagnosis not present

## 2021-07-15 DIAGNOSIS — F325 Major depressive disorder, single episode, in full remission: Secondary | ICD-10-CM | POA: Diagnosis not present

## 2021-07-15 DIAGNOSIS — Z86711 Personal history of pulmonary embolism: Secondary | ICD-10-CM

## 2021-07-15 DIAGNOSIS — N1831 Chronic kidney disease, stage 3a: Secondary | ICD-10-CM

## 2021-07-15 NOTE — Patient Instructions (Addendum)
Please go for labs in March.  You do not need to fast. Please check your blood pressure at home a couple times a week for the next 2 to 3 weeks and let me know if they are running greater than 130.

## 2021-07-15 NOTE — Assessment & Plan Note (Addendum)
Stable. Using his Anoro regularly.  He pep the regular walking to continue to maintain his stamina.

## 2021-07-15 NOTE — Assessment & Plan Note (Signed)
Following Q 6 months. Due next month for recheck.

## 2021-07-15 NOTE — Progress Notes (Signed)
Established Patient Office Visit  Subjective:  Patient ID: Christopher [name], male    DOB: 1951/01/21  Age: 71 y.o. MRN: [id number]  CC:  Chief Complaint  Patient presents with   Hypertension    Follow up     HPI Christopher [name] presents for   Hypertension- Pt denies chest pain, SOB, dizziness, or heart palpitations.  Taking meds as directed w/o problems.  Denies medication side effects.  Recently adjusted his medication to just plain lisinopril 5 mg daily.  F/U COPD -has been trying to consistently walk half a mile a day.  But says he always is really out of breath at the time he gets to the top of the hill.  He was really hoping that his endurance would get better but it just has not.  He has been to using his Anoro regularly.  No recent flares or exacerbations.  Past Medical History:  Diagnosis Date   Anxiety    Blood transfusion without reported diagnosis    Clotting disorder ([place])    COPD (chronic obstructive pulmonary disease) ([place])    Depression    Gastric ulcer    GERD (gastroesophageal reflux disease)    Hyperlipidemia    Hypertension    Pancreatitis    11/2010. 2011   Stroke [facility])     Past Surgical History:  Procedure Laterality Date   abdominal reconstructin     X 3   BILE DUCT EXPLORATION     CHOLECYSTECTOMY     Duodenum Stricture     Explorratory Laparotomy     GASTRECTOMY     GASTROJEJUNOSTOMY     LAPAROSCOPIC LYSIS OF ADHESIONS     Pancreatic leak repair     Resection of distal stomach and proximal Roux limb     ROUX-EN-Y GASTRIC BYPASS      Family History  Problem Relation Age of Onset   Hypertension Mother    Heart attack Father    Prostate cancer Father        prostate, lip cancer   Uterine cancer Sister    Lung cancer Brother    Colon cancer Sister 73   Colon cancer Sister 27   Ovarian cancer Sister    Heart attack Daughter    Esophageal cancer Neg Hx    Stomach cancer Neg Hx    Rectal cancer Neg Hx    Inflammatory bowel disease Neg Hx     Liver disease Neg Hx    Pancreatic cancer Neg Hx     Social History   Socioeconomic History   Marital status: Divorced    Spouse name: Not on file   Number of children: 4   Years of education: 12   Highest education level: 12th grade  Occupational History   Occupation: Games developer work    Comment: retired  Tobacco Use   Smoking status: Former   Smokeless tobacco: Former    Types: Chew   Tobacco comments:    Quit 15 years ago  Scientific laboratory technician Use: Never used  Substance and Sexual Activity   Alcohol use: Not Currently   Drug use: No   Sexual activity: Not Currently  Other Topics Concern   Not on file  Social History Narrative   Lives with daughter and grandkids and his step son. Likes to collect rocks and Engineer, structural. Visits [place] for 5-6 months a year.    Social Determinants of Health   Financial Resource Strain: Low Risk    Difficulty of Paying  Living Expenses: Not hard at all  Food Insecurity: No Food Insecurity   Worried About [place] in the Last Year: Never true   Ran Out of Food in the Last Year: Never true  Transportation Needs: No Transportation Needs   Lack of Transportation (Medical): No   Lack of Transportation (Non-Medical): No  Physical Activity: Inactive   Days of Exercise per Week: 0 days   Minutes of Exercise per Session: 0 min  Stress: No Stress Concern Present   Feeling of Stress : Not at all  Social Connections: Socially Isolated   Frequency of Communication with Friends and Family: More than three times a week   Frequency of Social Gatherings with Friends and Family: More than three times a week   Attends Religious Services: Never   Marine scientist or Organizations: No   Attends Music therapist: Never   Marital Status: Divorced  Human resources officer Violence: Not At Risk   Fear of Current or Ex-Partner: No   Emotionally Abused: No   Physically Abused: No   Sexually Abused: No    Outpatient Medications  Prior to Visit  Medication Sig Dispense Refill   albuterol (PROAIR HFA) 108 (90 Base) MCG/ACT inhaler Inhale 2 puffs into the lungs every 6 (six) hours as needed for wheezing. 3 g 4   citalopram (CELEXA) 20 MG tablet Take 1 tablet (20 mg total) by mouth daily. 90 tablet 0   lisinopril (ZESTRIL) 5 MG tablet Take 1 tablet (5 mg total) by mouth daily. 90 tablet 1   Multiple Vitamin (MULTIVITAMIN) tablet Take 1 tablet by mouth daily.     omeprazole (PRILOSEC) 20 MG capsule Take 1 capsule (20 mg total) by mouth daily. 90 capsule 0   rosuvastatin (CRESTOR) 10 MG tablet Take 1 tablet (10 mg total) by mouth daily. 90 tablet 3   umeclidinium-vilanterol (ANORO ELLIPTA) 62.5-25 MCG/ACT AEPB Inhale 1 puff by mouth once daily 180 each 3   No facility-administered medications prior to visit.    Allergies  Allergen Reactions   Lorazepam Other (See Comments)    hallucinations   Nsaids Other (See Comments)    Hx of chronic recurrent GI ulcer.    Onion Nausea And Vomiting   Pravastatin Other (See Comments)    myalgias   Simvastatin Other (See Comments)    myalgias    ROS Review of Systems    Objective:    Physical Exam Constitutional:      Appearance: Normal appearance. He is well-developed.  HENT:     Head: Normocephalic and atraumatic.  Cardiovascular:     Rate and Rhythm: Normal rate and regular rhythm.     Heart sounds: Normal heart sounds.  Pulmonary:     Effort: Pulmonary effort is normal.     Breath sounds: Normal breath sounds.  Skin:    General: Skin is warm and dry.  Neurological:     Mental Status: He is alert and oriented to person, place, and time. Mental status is at baseline.  Psychiatric:        Behavior: Behavior normal.    BP 135/77 (BP Location: Left Arm)    Pulse 85    Resp 16    Ht _0  (1.88 m)    Wt 240 lb (108.9 kg)    SpO2 99%    BMI 30.81 kg/m  Wt Readings from Last 3 Encounters:  07/15/21 240 lb (108.9 kg)  03/14/21 230 lb (104.3 kg)  09/12/20 250  lb  (113.4 kg)     There are no preventive care reminders to display for this patient.  There are no preventive care reminders to display for this patient.  Lab Results  Component Value Date   TSH 1.252 10/13/2012   Lab Results  Component Value Date   WBC 7.9 03/17/2021   HGB 14.3 03/17/2021   HCT 42.3 03/17/2021   MCV 95.3 03/17/2021   PLT 285 03/17/2021   Lab Results  Component Value Date   NA 141 03/17/2021   K 4.6 03/17/2021   CO2 29 03/17/2021   GLUCOSE 113 (H) 03/17/2021   BUN 18 03/17/2021   CREATININE 1.25 03/17/2021   BILITOT 0.4 03/17/2021   ALKPHOS 104 04/10/2016   AST 19 03/17/2021   ALT 22 03/17/2021   PROT 8.0 03/17/2021   ALBUMIN 4.5 04/10/2016   CALCIUM 9.7 03/17/2021   EGFR 62 03/17/2021   Lab Results  Component Value Date   CHOL 172 03/17/2021   Lab Results  Component Value Date   HDL 41 03/17/2021   Lab Results  Component Value Date   LDLCALC 102 (H) 03/17/2021   Lab Results  Component Value Date   TRIG 171 (H) 03/17/2021   Lab Results  Component Value Date   CHOLHDL 4.2 03/17/2021   No results found for: HGBA1C    Assessment & Plan:   Problem List Items Addressed This Visit       Cardiovascular and Mediastinum   Essential hypertension - Primary    Well controlled. Continue current regimen. Follow up in  6 mo       Relevant Orders   BASIC METABOLIC PANEL WITH GFR     Respiratory   COPD (chronic obstructive pulmonary disease) (HCC)    Stable. Using his Anoro regularly.  He pep the regular walking to continue to maintain his stamina.        Genitourinary   CKD (chronic kidney disease) stage 3, GFR 30-59 ml/min (HCC)    Following Q 6 months. Due next month for recheck.       Relevant Orders   BASIC METABOLIC PANEL WITH GFR     Other   History of pulmonary embolism   Depression    Stable on current dose of Celexa.  He is happy with his regimen and does not want to make any changes today.       Return for labs  next months.     No orders of the defined types were placed in this encounter.   Follow-up: Return in about 6 months (around 01/12/2022) for Hypertension.    Beatrice Lecher, MD

## 2021-07-15 NOTE — Assessment & Plan Note (Signed)
Well controlled. Continue current regimen. Follow up in  6 mo  

## 2021-07-15 NOTE — Assessment & Plan Note (Signed)
Stable on current dose of Celexa.  He is happy with his regimen and does not want to make any changes today.

## 2021-07-16 ENCOUNTER — Telehealth: Payer: Self-pay | Admitting: Family Medicine

## 2021-07-16 DIAGNOSIS — R918 Other nonspecific abnormal finding of lung field: Secondary | ICD-10-CM

## 2021-07-16 NOTE — Telephone Encounter (Signed)
Call patient and let him know that he is due for a repeat CT of his chest to follow-up on a pulmonary nodule.  He is due because its been 6 months.  Would he like to go ahead and get that scheduled here?

## 2021-07-16 NOTE — Telephone Encounter (Signed)
°  Okay.  Per our records from [place] system he did have a CT of the chest abdomen and pelvis on December 31, 2020 and this is what it said.  If he wants to hold off on the imaging then that is perfectly fine.   Impression  1. Decreased size of peripancreatic collections as detailed above.  2. Fistulous tract connecting the anterior abdominal wall to the pancreatic body collection, with decreased associated fluid.  3. Small nodular pulmonary opacities with a largest average diameter of 6 mm. Recommend six-month follow-up chest CT.  4. Emphysema.

## 2021-07-17 NOTE — Telephone Encounter (Signed)
Patient agreed to have a CT.

## 2021-07-17 NOTE — Telephone Encounter (Signed)
Orders Placed This Encounter  Procedures   CT Chest Wo Contrast    [place]  This was the note from the interpretation on the imaging study.  3. Small nodular pulmonary opacities with a largest average diameter of 6 mm. Recommend six-month follow-up chest CT.    Standing Status:   Future    Standing Expiration Date:   07/17/2022    Order Specific Question:   Preferred imaging location?    Answer:   [facility]

## 2021-07-25 ENCOUNTER — Ambulatory Visit (INDEPENDENT_AMBULATORY_CARE_PROVIDER_SITE_OTHER): Payer: Medicare Other

## 2021-07-25 ENCOUNTER — Other Ambulatory Visit: Payer: Self-pay

## 2021-07-25 DIAGNOSIS — R911 Solitary pulmonary nodule: Secondary | ICD-10-CM

## 2021-07-25 DIAGNOSIS — R918 Other nonspecific abnormal finding of lung field: Secondary | ICD-10-CM | POA: Diagnosis not present

## 2021-07-25 DIAGNOSIS — J439 Emphysema, unspecified: Secondary | ICD-10-CM | POA: Diagnosis not present

## 2021-07-25 DIAGNOSIS — I700 Atherosclerosis of aorta: Secondary | ICD-10-CM | POA: Diagnosis not present

## 2021-07-28 ENCOUNTER — Encounter: Payer: Self-pay | Admitting: Family Medicine

## 2021-07-28 DIAGNOSIS — J432 Centrilobular emphysema: Secondary | ICD-10-CM | POA: Insufficient documentation

## 2021-07-28 NOTE — Progress Notes (Signed)
Hi Kelden, you do have multiple pulmonary nodules the largest being about 6 mm in the left upper lung.  There is a little bit of scar tissue in both upper lungs as well.  And evidence of centrilobular emphysema.  They are recommending a repeat noncontrast CT at 3 to 6 months and then again at 18 to 24 months to document stability.

## 2021-08-08 ENCOUNTER — Encounter: Payer: Self-pay | Admitting: Family Medicine

## 2021-08-08 MED ORDER — LISINOPRIL 10 MG PO TABS: 10.0000 mg | ORAL_TABLET | Freq: Every day | ORAL | 0 refills | Status: DC

## 2021-08-08 NOTE — Telephone Encounter (Signed)
Yes, back to 10 ?

## 2021-08-18 ENCOUNTER — Other Ambulatory Visit: Payer: Medicare Other

## 2021-08-18 DIAGNOSIS — I10 Essential (primary) hypertension: Secondary | ICD-10-CM | POA: Diagnosis not present

## 2021-08-18 DIAGNOSIS — N1831 Chronic kidney disease, stage 3a: Secondary | ICD-10-CM | POA: Diagnosis not present

## 2021-08-19 LAB — BASIC METABOLIC PANEL WITH GFR
BUN/Creatinine Ratio: 13 (calc) (ref 6–22)
BUN: 18 mg/dL (ref 7–25)
CO2: 26 mmol/L (ref 20–32)
Calcium: 9.2 mg/dL (ref 8.6–10.3)
Chloride: 104 mmol/L (ref 98–110)
Creat: 1.34 mg/dL — ABNORMAL HIGH (ref 0.70–1.28)
Glucose, Bld: 108 mg/dL — ABNORMAL HIGH (ref 65–99)
Potassium: 4.7 mmol/L (ref 3.5–5.3)
Sodium: 138 mmol/L (ref 135–146)
eGFR: 57 mL/min/{1.73_m2} — ABNORMAL LOW (ref >=60)

## 2021-08-23 ENCOUNTER — Other Ambulatory Visit: Payer: Self-pay | Admitting: Family Medicine

## 2021-08-23 DIAGNOSIS — K219 Gastro-esophageal reflux disease without esophagitis: Secondary | ICD-10-CM

## 2021-08-23 DIAGNOSIS — F325 Major depressive disorder, single episode, in full remission: Secondary | ICD-10-CM

## 2021-08-27 ENCOUNTER — Other Ambulatory Visit: Payer: Self-pay

## 2021-08-27 DIAGNOSIS — J410 Simple chronic bronchitis: Secondary | ICD-10-CM

## 2021-08-27 MED ORDER — UMECLIDINIUM-VILANTEROL 62.5-25 MCG/ACT IN AEPB: INHALATION_SPRAY | RESPIRATORY_TRACT | 3 refills | Status: DC

## 2021-09-24 ENCOUNTER — Ambulatory Visit (INDEPENDENT_AMBULATORY_CARE_PROVIDER_SITE_OTHER): Payer: Medicare Other | Admitting: Family Medicine

## 2021-09-24 DIAGNOSIS — Z0000 Encounter for general adult medical examination without abnormal findings: Secondary | ICD-10-CM

## 2021-09-24 NOTE — Progress Notes (Signed)
? ? ?MEDICARE ANNUAL WELLNESS VISIT ? ?09/24/2021 ? ?Telephone Visit Disclaimer ?This Medicare AWV was conducted by telephone due to national recommendations for restrictions regarding the COVID-19 Pandemic (e.g. social distancing).  I verified, using two identifiers, that I am speaking with Christopher [name] or their authorized healthcare agent. I discussed the limitations, risks, security, and privacy concerns of performing an evaluation and management service by telephone and the potential availability of an in-person appointment in the future. The patient expressed understanding and agreed to proceed.  ?Location of Patient: Home ?Location of Provider (nurse):  In the office. ? ?Subjective:  ? ? ?Christopher [name] is a 71 y.o. male patient of [name], Rene Kocher, MD who had a Medicare Annual Wellness Visit today via telephone. Christopher [name] is Retired and lives with their daughter and son-in-law. he has 4 children. he reports that he is socially active and does interact with friends/family regularly. he is moderately physically active and enjoys woodworking and collecting rocks. ? ?Patient Care Team: ?Christopher Marry, MD as PCP - General ?Christopher [name] (Gastroenterology) ?Christopher [name], [facility] as Pharmacist (Pharmacist) ? ? ?  09/24/2021  ?  1:06 PM 08/28/2020  ?  9:11 AM 09/26/2018  ?  9:09 AM  ?Advanced Directives  ?Does Patient Have a Medical Advance Directive? Yes Yes Yes  ?Type of Advance Directive Living will;Healthcare Power of [place];Living will [place];Living will  ?Does patient want to make changes to medical advance directive? No - Patient declined No - Patient declined No - Patient declined  ?Copy of [place] in Chart? No - copy requested No - copy requested No - copy requested  ? ? ?Hospital Utilization Over the Past 12 Months: ?# of hospitalizations or ER visits: 1 ?# of surgeries: 0 ? ?Review of Systems    ?Patient reports that his overall health is  better compared to last year. ? ?History obtained from chart review and the patient ? ?Patient Reported Readings (BP, Pulse, CBG, Weight, etc) ?none ? ?Pain Assessment ?Pain : No/denies pain ? ?  ? ?Current Medications & Allergies (verified) ?Allergies as of 09/24/2021   ? ?   Reactions  ? Lorazepam Other (See Comments)  ? hallucinations  ? Nsaids Other (See Comments)  ? Hx of chronic recurrent GI ulcer.   ? Onion Nausea And Vomiting  ? Pravastatin Other (See Comments)  ? myalgias  ? Simvastatin Other (See Comments)  ? myalgias  ? ?  ? ?  ?Medication List  ?  ? ?  ? Accurate as of Sep 24, 2021  1:15 PM. If you have any questions, ask your nurse or doctor.  ?  ?  ? ?  ? ?albuterol 108 (90 Base) MCG/ACT inhaler ?Commonly known as: ProAir HFA ?Inhale 2 puffs into the lungs every 6 (six) hours as needed for wheezing. ?  ?citalopram 20 MG tablet ?Commonly known as: CELEXA ?Take 1 tablet by mouth once daily ?  ?lisinopril 10 MG tablet ?Commonly known as: ZESTRIL ?Take 1 tablet (10 mg total) by mouth daily. ?  ?multivitamin tablet ?Take 1 tablet by mouth daily. ?  ?omeprazole 20 MG capsule ?Commonly known as: PRILOSEC ?Take 1 capsule by mouth once daily ?  ?rosuvastatin 10 MG tablet ?Commonly known as: CRESTOR ?Take 1 tablet (10 mg total) by mouth daily. ?  ?umeclidinium-vilanterol 62.5-25 MCG/ACT Aepb ?Commonly known as: Anoro Ellipta ?Inhale 1 puff by mouth once daily. ?  ? ?  ? ? ?History (  reviewed): ?Past Medical History:  ?Diagnosis Date  ? Anxiety   ? Blood transfusion without reported diagnosis   ? Clotting disorder ([place])   ? COPD (chronic obstructive pulmonary disease) ([place])   ? Depression   ? Gastric ulcer   ? GERD (gastroesophageal reflux disease)   ? Hyperlipidemia   ? Hypertension   ? Pancreatitis   ? 11/2010. 2011  ? Stroke [facility])   ? ?Past Surgical History:  ?Procedure Laterality Date  ? abdominal reconstructin    ? X 3  ? BILE DUCT EXPLORATION    ? CHOLECYSTECTOMY    ? Duodenum Stricture    ? Explorratory  Laparotomy    ? GASTRECTOMY    ? GASTROJEJUNOSTOMY    ? LAPAROSCOPIC LYSIS OF ADHESIONS    ? Pancreatic leak repair    ? 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 ROUX-EN-Y GASTRIC BYPASS    ? ?Family History  ?Problem Relation Age of Onset  ? Hypertension Mother   ? Heart attack Father   ? Prostate cancer Father   ?     prostate, lip cancer  ? Uterine cancer Sister   ? Lung cancer Brother   ? Colon cancer Sister 41  ? Colon cancer Sister 54  ? Ovarian cancer Sister   ? Heart attack Daughter   ? Esophageal cancer Neg Hx   ? Stomach cancer Neg Hx   ? Rectal cancer Neg Hx   ? Inflammatory bowel disease Neg Hx   ? Liver disease Neg Hx   ? Pancreatic cancer Neg Hx   ? ?Social History  ? ?Socioeconomic History  ? Marital status: Divorced  ?  Spouse name: Not on file  ? Number of children: 4  ? Years of education: 59  ? Highest education level: 12th grade  ?Occupational History  ? Occupation: Games developer work  ?  Comment: retired  ?Tobacco Use  ? Smoking status: Former  ? Smokeless tobacco: Former  ?  Types: Chew  ? Tobacco comments:  ?  Quit 15 years ago  ?Vaping Use  ? Vaping Use: Never used  ?Substance and Sexual Activity  ? Alcohol use: Not Currently  ? Drug use: No  ? Sexual activity: Not Currently  ?Other Topics Concern  ? Not on file  ?Social History Narrative  ? Lives with daughter and son in law. Likes to collect rocks and Engineer, structural. Visits [place] for 5-6 months a year.   ? ?Social Determinants of Health  ? ?Financial Resource Strain: Low Risk   ? Difficulty of Paying Living Expenses: Not very hard  ?Food Insecurity: No Food Insecurity  ? Worried About Charity fundraiser in the Last Year: Never true  ? Ran Out of Food in the Last Year: Never true  ?Transportation Needs: No Transportation Needs  ? Lack of Transportation (Medical): No  ? Lack of Transportation (Non-Medical): No  ?Physical Activity: Sufficiently Active  ? Days of Exercise per Week: 6 days  ? Minutes of Exercise per Session: 30 min   ?Stress: No Stress Concern Present  ? Feeling of Stress : Only a little  ?Social Connections: Socially Isolated  ? Frequency of Communication with Friends and Family: Three times a week  ? Frequency of Social Gatherings with Friends and Family: Twice a week  ? Attends Religious Services: Never  ? Active Member of Clubs or Organizations: No  ? Attends Archivist Meetings: Never  ? Marital Status: Divorced  ? ? ?  Activities of Daily Living ? ?  09/23/2021  ?  1:21 PM  ?In your present state of health, do you have any difficulty performing the following activities:  ?Hearing? 0  ?Vision? 0  ?Difficulty concentrating or making decisions? 0  ?Walking or climbing stairs? 0  ?Dressing or bathing? 0  ?Doing errands, shopping? 0  ?Preparing Food and eating ? N  ?Using the Toilet? N  ?In the past six months, have you accidently leaked urine? N  ?Do you have problems with loss of bowel control? N  ?Managing your Medications? N  ?Managing your Finances? N  ?Housekeeping or managing your Housekeeping? N  ? ? ?Patient Education/ Literacy ?How often do you need to have someone help you when you read instructions, pamphlets, or other written materials from your doctor or pharmacy?: 1 - Never ?What is the last grade level you completed in school?: 12th grade ? ?Exercise ?Current Exercise Habits: Home exercise routine, Type of exercise: walking, Time (Minutes): 30, Frequency (Times/Week): 6, Weekly Exercise (Minutes/Week): 180, Intensity: Moderate, Exercise limited by: None identified ? ?Diet ?Patient reports consuming 3 meals a day and 1 snack(s) a day ?Patient reports that his primary diet is: Regular ?Patient reports that she does have regular access to food.  ? ?Depression Screen ? ?  09/24/2021  ?  1:07 PM 07/15/2021  ?  9:51 AM 09/12/2020  ? 11:11 AM 08/28/2020  ?  9:12 AM 08/16/2019  ?  9:36 AM 09/26/2018  ?  9:10 AM 09/08/2017  ?  9:14 AM  ?PHQ 2/9 Scores  ?PHQ - 2 Score 0 0 0 0 0 0 0  ?PHQ- 9 Score   2  3    ?  ? ?Fall  Risk ? ?  09/23/2021  ?  1:21 PM 07/15/2021  ?  9:50 AM 08/28/2020  ?  9:12 AM 08/16/2019  ?  8:57 AM 09/26/2018  ?  9:10 AM  ?Fall Risk   ?Falls in the past year? 0 0 0 0 0  ?Number falls in past yr: 0 0 0 0   ?Injury with F

## 2021-09-24 NOTE — Patient Instructions (Signed)
?MEDICARE ANNUAL WELLNESS VISIT ?Health Maintenance Summary and Written Plan of Care ? ?[name] , ? ?Thank you for allowing me to perform your Medicare Annual Wellness Visit and for your ongoing commitment to your health.  ? ?Health Maintenance & Immunization History ?Health Maintenance  ?Topic Date Due  ? INFLUENZA VACCINE  12/23/2021  ? COLONOSCOPY (Pts 45-47yr Insurance coverage will need to be confirmed)  04/03/2022  ? TETANUS/TDAP  09/13/2030  ? Pneumonia Vaccine 72 Years old  Completed  ? COVID-19 Vaccine  Completed  ? Hepatitis C Screening  Completed  ? Zoster Vaccines- Shingrix  Completed  ? HPV VACCINES  Aged Out  ? ?Immunization History  ?Administered Date(s) Administered  ? Fluad Quad(high Dose 65+) 03/08/2019, 03/11/2020, 03/14/2021  ? Influenza Split 02/23/2012  ? Influenza Whole 03/05/2010, 01/16/2011  ? Influenza, High Dose Seasonal PF 03/17/2016, 03/02/2017, 03/22/2018  ? Influenza,inj,Quad PF,6+ Mos 01/12/2014, 02/04/2015  ? Influenza-Unspecified 04/10/2013  ? Moderna SARS-COV2 Booster Vaccination 03/20/2020  ? Moderna Sars-Covid-2 Vaccination 06/20/2019, 07/18/2019  ? PPension scheme manager116yr& up 03/17/2021  ? Pneumococcal Conjugate-13 06/13/2015  ? Pneumococcal Polysaccharide-23 04/01/2010, 09/01/2016  ? Tdap 01/16/2011, 09/12/2020  ? Zoster Recombinat (Shingrix) 09/12/2020, 03/04/2021  ? Zoster, Live 01/01/2011  ? ? ?These are the patient goals that we discussed: ? Goals Addressed   ? ?  ?  ?  ?  ?  ? This Visit's Progress  ?   Patient Stated (pt-stated)     ?   Would like to continue to be active and walking everyday. ?  ? ?  ?  ? ?This is a list of Health Maintenance Items that are overdue or due now: ?There are no preventive care reminders to display for this patient.  ? ?Orders/Referrals Placed Today: ?No orders of the defined types were placed in this encounter. ? ?(Contact our referral department at [phone] you have not spoken with someone about your  referral appointment within the next 5 days)  ? ? ?Follow-up Plan ?Follow-up with [name] as planned ?Medicare wellness in one year. ?Patient will access AVS on my chart. ? ? ?  ?Health Maintenance, Male ?Adopting a healthy lifestyle and getting preventive care are important in promoting health and wellness. Ask your health care provider about: ?The right schedule for you to have regular tests and exams. ?Things you can do on your own to prevent diseases and keep yourself healthy. ?What should I know about diet, weight, and exercise? ?Eat a healthy diet ? ?Eat a diet that includes plenty of vegetables, fruits, low-fat dairy products, and lean protein. ?Do not eat a lot of foods that are high in solid fats, added sugars, or sodium. ?Maintain a healthy weight ?Body mass index (BMI) is a measurement that can be used to identify possible weight problems. It estimates body fat based on height and weight. Your health care provider can help determine your BMI and help you achieve or maintain a healthy weight. ?Get regular exercise ?Get regular exercise. This is one of the most important things you can do for your health. Most adults should: ?Exercise for at least 150 minutes each week. The exercise should increase your heart rate and make you sweat (moderate-intensity exercise). ?Do strengthening exercises at least twice a week. This is in addition to the moderate-intensity exercise. ?Spend less time sitting. Even light physical activity can be beneficial. ?Watch cholesterol and blood lipids ?Have your blood tested for lipids and cholesterol at 72 years of  age, then have this test every 5 years. ?You may need to have your cholesterol levels checked more often if: ?Your lipid or cholesterol levels are high. ?You are older than 72 years of age. ?You are at high risk for heart disease. ?What should I know about cancer screening? ?Many types of cancers can be detected early and may often be prevented.  Depending on your health history and family history, you may need to have cancer screening at various ages. This may include screening for: ?Colorectal cancer. ?Prostate cancer. ?Skin cancer. ?Lung cancer. ?What should I know about heart disease, diabetes, and high blood pressure? ?Blood pressure and heart disease ?High blood pressure causes heart disease and increases the risk of stroke. This is more likely to develop in people who have high blood pressure readings or are overweight. ?Talk with your health care provider about your target blood pressure readings. ?Have your blood pressure checked: ?Every 3-5 years if you are 86-77 years of age. ?Every year if you are 21 years old or older. ?If you are between the ages of 29 and 35 and are a current or former smoker, ask your health care provider if you should have a one-time screening for abdominal aortic aneurysm (AAA). ?Diabetes ?Have regular diabetes screenings. This checks your fasting blood sugar level. Have the screening done: ?Once every three years after age 3 if you are at a normal weight and have a low risk for diabetes. ?More often and at a younger age if you are overweight or have a high risk for diabetes. ?What should I know about preventing infection? ?Hepatitis B ?If you have a higher risk for hepatitis B, you should be screened for this virus. Talk with your health care provider to find out if you are at risk for hepatitis B infection. ?Hepatitis C ?Blood testing is recommended for: ?Everyone born from [birth year] through 1966. ?Anyone with known risk factors for hepatitis C. ?Sexually transmitted infections (STIs) ?You should be screened each year for STIs, including gonorrhea and chlamydia, if: ?You are sexually active and are younger than 72 years of age. ?You are older than 72 years of age and your health care provider tells you that you are at risk for this type of infection. ?Your sexual activity has changed since you were last screened, and you are  at increased risk for chlamydia or gonorrhea. Ask your health care provider if you are at risk. ?Ask your health care provider about whether you are at high risk for HIV. Your health care provider may recommend a prescription medicine to help prevent HIV infection. If you choose to take medicine to prevent HIV, you should first get tested for HIV. You should then be tested every 3 months for as long as you are taking the medicine. ?Follow these instructions at home: ?Alcohol use ?Do not drink alcohol if your health care provider tells you not to drink. ?If you drink alcohol: ?Limit how much you have to 0-2 drinks a day. ?Know how much alcohol is in your drink. In the [place] one drink equals one 12 oz bottle of beer (355 mL), one 5 oz glass of wine (148 mL), or one 1? oz glass of hard liquor (44 mL). ?Lifestyle ?Do not use any products that contain nicotine or tobacco. These products include cigarettes, chewing tobacco, and vaping devices, such as e-cigarettes. If you need help quitting, ask your health care provider. ?Do not use street drugs. ?Do not share needles. ?Ask your health care provider for  help if you need support or information about quitting drugs. ?General instructions ?Schedule regular health, dental, and eye exams. ?Stay current with your vaccines. ?Tell your health care provider if: ?You often feel depressed. ?You have ever been abused or do not feel safe at home. ?Summary ?Adopting a healthy lifestyle and getting preventive care are important in promoting health and wellness. ?Follow your health care provider's instructions about healthy diet, exercising, and getting tested or screened for diseases. ?Follow your health care provider's instructions on monitoring your cholesterol and blood pressure. ?This information is not intended to replace advice given to you by your health care provider. Make sure you discuss any questions you have with your health care provider. ?Document Revised: 09/30/2020  Document Reviewed: 09/30/2020 ?Elsevier Patient Education ? [place]. ? ?

## 2021-10-16 ENCOUNTER — Telehealth: Payer: Medicare Other

## 2021-11-12 ENCOUNTER — Ambulatory Visit (INDEPENDENT_AMBULATORY_CARE_PROVIDER_SITE_OTHER): Payer: Medicare Other | Admitting: Pharmacist

## 2021-11-12 ENCOUNTER — Telehealth: Payer: Medicare Other

## 2021-11-12 DIAGNOSIS — J410 Simple chronic bronchitis: Secondary | ICD-10-CM

## 2021-11-12 DIAGNOSIS — E7800 Pure hypercholesterolemia, unspecified: Secondary | ICD-10-CM

## 2021-11-12 DIAGNOSIS — I10 Essential (primary) hypertension: Secondary | ICD-10-CM

## 2021-11-12 MED ORDER — LISINOPRIL 10 MG PO TABS: 10.0000 mg | ORAL_TABLET | Freq: Every day | ORAL | 1 refills | Status: DC

## 2021-11-12 NOTE — Patient Instructions (Signed)
Visit Information  Thank you for taking time to visit with me today. Please don't hesitate to contact me if I can be of assistance to you before our next scheduled telephone appointment.  Following are the goals we discussed today:  Patient Goals/Self-Care Activities Over the next 180 days, patient will:  take medications as prescribed and collaborate with provider on medication access solutions  Follow Up Plan: Telephone follow up appointment with care management team member scheduled for:  6 months    Please call the care guide team at [phone] if you need to cancel or reschedule your appointment.    Patient verbalizes understanding of instructions and care plan provided today and agrees to view in [place]. Active MyChart status and patient understanding of how to access instructions and care plan via MyChart confirmed with patient.     Christopher [name]

## 2021-11-12 NOTE — Progress Notes (Signed)
Chronic Care Management Pharmacy Note  11/12/2021 Name:  Christopher [name] MRN:  [id number] DOB:  [birth year]  Summary: addressed HTN, HLD, COPD. All medications are going well. He has previously received anoro ellipta supply via patient assistance, but not until end of year after meeting spend requirement.  Patient [place] Patient requests refills while he returns home briefly next week, [facility] rosuvastatin, omeprazole, lisinopril '10mg'$ , and citalopram.  Recommendations/Changes made from today's visit:  - No changes, recommend [place] '[place]'$  [place](all other medications appear to have adequate refills available at pharmacy).  Plan: f/u with pharmacist in 6 months  Subjective: BNetanel Yannuzziis an 71y.o. year old male who is a primary patient of Christopher [name], Christopher [name].  The CCM team was consulted for assistance with disease management and care coordination needs.    Engaged with patient by telephone for follow up visit in response to provider referral for pharmacy case management and/or care coordination services.   Consent to Services:  The patient was given information about Chronic Care Management services, agreed to services, and gave verbal consent prior to initiation of services.  Please see initial visit note for detailed documentation.   Patient Care Team: MHali Marry [name] as PCP - General Dr CCarlis Abbott(Gastroenterology) KDarius [name] RPrince [name] LLCas Pharmacist (Pharmacist)   Objective:  Lab Results  Component Value Date   CREATININE 1.34 (H) 08/18/2021   CREATININE 1.25 03/17/2021   CREATININE 1.27 (H) 09/12/2020       Component Value Date/Time   CHOL 172 03/17/2021 0929   TRIG 171 (H) 03/17/2021 0929   HDL 41 03/17/2021 0929   CHOLHDL 4.2 03/17/2021 0929   VLDL 17 04/10/2016 0853   LDLCALC 102 (H) 03/17/2021 0929       Latest Ref Rng & Units 03/17/2021    9:29 AM  03/11/2020    9:49 AM 03/08/2019    9:40 AM  Hepatic Function  Total Protein 6.1 - 8.1 g/dL 8.0  7.0  7.1   AST 10 - 35 U/L '19  23  23   '$ ALT 9 - 46 U/L 22  36  26   Total Bilirubin 0.2 - 1.2 mg/dL 0.4  0.5  0.5     Lab Results  Component Value Date/Time   TSH 1.252 10/13/2012 09:41 AM       Latest Ref Rng & Units 03/17/2021    9:29 AM 04/04/2018    7:47 AM 01/24/2014    8:22 AM  CBC  WBC 3.8 - 10.8 Thousand/uL 7.9  4.1  4.3   Hemoglobin 13.2 - 17.1 g/dL 14.3  14.3  14.6   Hematocrit 38.5 - 50.0 % 42.3  40.8  42.1   Platelets 140 - 400 Thousand/uL 285  226  205     Clinical ASCVD:  The 10-year ASCVD risk score (Arnett DK, et al., 2019) is: 24.5%   Values used to calculate the score:     Age: 71years     Sex: Male     Is Non-Hispanic African American: No     Diabetic: No     Tobacco smoker: No     Systolic Blood Pressure: 7274mmHg     Is BP treated: Yes     HDL Cholesterol: 41 mg/dL     Total Cholesterol: 172 mg/dL     Social History   Tobacco Use  Smoking Status Former  Smokeless Tobacco Former   Types: Chew  Tobacco Comments   Quit 15 years ago   BP Readings from Last 3 Encounters:  07/15/21 135/77  03/14/21 120/77  09/12/20 132/76   Pulse Readings from Last 3 Encounters:  07/15/21 85  03/14/21 85  09/12/20 88   Wt Readings from Last 3 Encounters:  07/15/21 240 lb (108.9 kg)  03/14/21 230 lb (104.3 kg)  09/12/20 250 lb (113.4 kg)    Assessment: Review of patient past medical history, allergies, medications, health status, including review of consultants reports, laboratory and other test data, was performed as part of comprehensive evaluation and provision of chronic care management services.   SDOH:  (Social Determinants of Health) assessments and interventions performed:    CCM Care Plan  Allergies  Allergen Reactions   Lorazepam Other (See Comments)    hallucinations   Nsaids Other (See Comments)    Hx of chronic recurrent GI ulcer.     Onion Nausea And Vomiting   Pravastatin Other (See Comments)    myalgias   Simvastatin Other (See Comments)    myalgias    Medications Reviewed Today     Reviewed by Christopher [name], [name] (Pharmacist) on 11/12/21 at 1314  Med List Status: <None>   Medication Order Taking? Sig Documenting Provider Last Dose Status Informant  albuterol (PROAIR HFA) 108 (90 Base) MCG/ACT inhaler [id number] Yes Inhale 2 puffs into the lungs every 6 (six) hours as needed for wheezing. Christopher Marry, [name] Taking Active   citalopram (CELEXA) 20 MG tablet [id number] Yes Take 1 tablet by mouth once daily Christopher Marry, [name] Taking Active   lisinopril (ZESTRIL) 10 MG tablet [id number] Yes Take 1 tablet (10 mg total) by mouth daily. Christopher Marry, [name] Taking Active   Multiple Vitamin (MULTIVITAMIN) tablet [id number] Yes Take 1 tablet by mouth daily. Provider, Historical, [name] Taking Active   omeprazole (PRILOSEC) 20 MG capsule [id number] Yes Take 1 capsule by mouth once daily Christopher Marry, [name] Taking Active   rosuvastatin (CRESTOR) 10 MG tablet [id number] Yes Take 1 tablet (10 mg total) by mouth daily. Christopher Marry, [name] Taking Active   umeclidinium-vilanterol [facility] ELLIPTA) 62.5-25 MCG/ACT AEPB [id number] Yes Inhale 1 puff by mouth once daily. Christopher Marry, [name] Taking Active             Patient Active Problem List   Diagnosis Date Noted   Centrilobular emphysema ([place]) 07/28/2021   History of pulmonary embolism 07/15/2021   Pulmonary nodule 03/18/2021   Chronic GERD 09/12/2020   Synovial cyst 08/16/2019   Acute bilateral low back pain without sciatica 03/08/2019   Aortic aneurysm ([place]) 07/18/2018   High risk for colon cancer 07/14/2018   Family hx of colon cancer 07/14/2018   Pancreatic cyst 07/14/2018   S/P biliary surgery 07/14/2018   Essential hypertension 04/06/2018   CKD (chronic kidney disease) stage 3, GFR 30-59 ml/min ([place]) 04/12/2016   Depression 01/04/2013    Hyperlipidemia 12/31/2011   Obesity 07/07/2011   COPD (chronic obstructive pulmonary disease) ([place]) 01/16/2011   ANEMIA, IRON DEFICIENCY 02/03/2010   PERFORATION OF INTESTINE 02/03/2010    Immunization History  Administered Date(s) Administered   Fluad Quad(high Dose 65+) 03/08/2019, 03/11/2020, 03/14/2021   Influenza Split 02/23/2012   Influenza Whole 03/05/2010, 01/16/2011   Influenza, High Dose Seasonal PF 03/17/2016, 03/02/2017, 03/22/2018   Influenza,inj,Quad PF,6+ Mos 01/12/2014, 02/04/2015   Influenza-Unspecified 04/10/2013   Moderna SARS-COV2 Booster Vaccination 03/20/2020   Moderna Sars-Covid-2 Vaccination 06/20/2019,  07/18/2019   Pfizer Covid-19 Vaccine Bivalent Booster 54yr & up 03/17/2021   Pneumococcal Conjugate-13 06/13/2015   Pneumococcal Polysaccharide-23 04/01/2010, 09/01/2016   Tdap 01/16/2011, 09/12/2020   Zoster Recombinat (Shingrix) 09/12/2020, 03/04/2021   Zoster, Live 01/01/2011    Conditions to be addressed/monitored: HTN, HLD, and COPD  Care Plan : Medication Management  Updates made by KDarius [name] RCudahysince 11/12/2021 12:00 AM     Problem: HTN, HLD, COPD      Long-Range Goal: Disease Progression Prevention   Start Date: 03/06/2021  Recent Progress: On track  Priority: High  Note:   Current Barriers:  Unable to independently afford treatment regimen Anoro Ellipta   Pharmacist Clinical Goal(s):  Over the next 180 days, patient will adhere to plan to optimize therapeutic regimen for chronic conditions as evidenced by report of adherence to recommended medication management changes through collaboration with PharmD and provider.   Interventions: 1:1 collaboration with MHali Marry [name] regarding development and update of comprehensive plan of care as evidenced by provider attestation and co-signature Inter-disciplinary care team collaboration (see longitudinal plan of care) Comprehensive medication review performed; medication list  updated in electronic medical record  Hypertension:  Controlled; current treatment:lisinopril 10 mg daily   Current home readings: patient mentions he forgets to take his blood pressure, but will work on this  Denies hypotensive/hypertensive symptoms  Counseled on appropriate BP goals Recommended discontinue lisinopril-hctz 10-12.'5mg'$  daily, and adjust to lisinopril '5mg'$  daily so that patient does not have to keep splitting tablets Hyperlipidemia:  Controlled; current treatment:rosuvastatin '10mg'$  daily; LDL 81  Medications previously tried: pravastatin, simvastatin   Recommended continue current regimen Chronic Obstructive Pulmonary Disease:  Controlled; current treatment:Anoro Ellipta daily, albuterol PRN;   0 exacerbations requiring treatment in the last 6 months   Recommended continue current regimen Assessed patient finances. Patient is eligible for anoro ellipta patient assistance after meeting spend requirement, [id number] was sent via mychart message  Patient Goals/Self-Care Activities Over the next 180 days, patient will:  take medications as prescribed and collaborate with provider on medication access solutions  Follow Up Plan: Telephone follow up appointment with care management team member scheduled for:  6 months         Medication Assistance: Application for Anoro ellipta  medication assistance program. in process.  Anticipated assistance start date TBD.  See plan of care for additional detail.  Patient's preferred pharmacy is:  [place] [address] [place]Phone: [phone]: [phone] [address]Phone: [phone]: [phone]  Uses pill box? Yes Pt endorses 100% compliance  Follow Up:  Patient agrees to Care Plan and Follow-up.  Plan: Telephone follow up  appointment with care management team member scheduled for:  6 month  KLarinda Buttery PharmD Clinical Pharmacist [phone]

## 2021-11-12 NOTE — Addendum Note (Signed)
Addended by: Beatrice Lecher D on: 11/12/2021 05:49 PM   Modules accepted: Orders

## 2021-11-21 DIAGNOSIS — J410 Simple chronic bronchitis: Secondary | ICD-10-CM | POA: Diagnosis not present

## 2021-11-21 DIAGNOSIS — E785 Hyperlipidemia, unspecified: Secondary | ICD-10-CM | POA: Diagnosis not present

## 2021-11-21 DIAGNOSIS — I10 Essential (primary) hypertension: Secondary | ICD-10-CM

## 2022-03-02 ENCOUNTER — Ambulatory Visit: Payer: Medicare Other | Admitting: Family Medicine

## 2022-03-02 ENCOUNTER — Other Ambulatory Visit: Payer: Self-pay | Admitting: Family Medicine

## 2022-03-02 DIAGNOSIS — D649 Anemia, unspecified: Secondary | ICD-10-CM

## 2022-03-02 DIAGNOSIS — I10 Essential (primary) hypertension: Secondary | ICD-10-CM

## 2022-03-02 DIAGNOSIS — N1831 Chronic kidney disease, stage 3a: Secondary | ICD-10-CM

## 2022-03-02 DIAGNOSIS — Z125 Encounter for screening for malignant neoplasm of prostate: Secondary | ICD-10-CM

## 2022-03-02 DIAGNOSIS — F325 Major depressive disorder, single episode, in full remission: Secondary | ICD-10-CM

## 2022-03-02 DIAGNOSIS — Z1329 Encounter for screening for other suspected endocrine disorder: Secondary | ICD-10-CM

## 2022-03-02 DIAGNOSIS — E7800 Pure hypercholesterolemia, unspecified: Secondary | ICD-10-CM

## 2022-03-02 NOTE — Progress Notes (Deleted)
   Established Patient Office Visit  Subjective   Patient ID: Christopher [name], male    DOB: 12/04/1950  Age: 71 y.o. MRN: [id number]  No chief complaint on file.   HPI  Hypertension- Pt denies chest pain, SOB, dizziness, or heart palpitations.  Taking meds as directed w/o problems.  Denies medication side effects.    F/U COPD -   {History (Optional):23778}  ROS    Objective:     There were no vitals taken for this visit. {Vitals History (Optional):23777}  Physical Exam Constitutional:      Appearance: He is well-developed.  HENT:     Head: Normocephalic and atraumatic.  Cardiovascular:     Rate and Rhythm: Normal rate and regular rhythm.     Heart sounds: Normal heart sounds.  Pulmonary:     Effort: Pulmonary effort is normal.     Breath sounds: Normal breath sounds.  Skin:    General: Skin is warm and dry.  Neurological:     Mental Status: He is alert and oriented to person, place, and time.  Psychiatric:        Behavior: Behavior normal.      No results found for any visits on 03/02/22.  {Labs (Optional):23779}  The 10-year ASCVD risk score (Arnett DK, et al., 2019) is: 24.5%    Assessment & Plan:   Problem List Items Addressed This Visit       Cardiovascular and Mediastinum   Essential hypertension - Primary     Respiratory   COPD (chronic obstructive pulmonary disease) (HCC)     Genitourinary   CKD (chronic kidney disease) stage 3, GFR 30-59 ml/min (HCC)     Other   Hyperlipidemia   Other Visit Diagnoses     Anemia, unspecified type       Screening for prostate cancer       Thyroid disorder screen           No follow-ups on file.    Beatrice Lecher, MD

## 2022-03-25 ENCOUNTER — Ambulatory Visit (INDEPENDENT_AMBULATORY_CARE_PROVIDER_SITE_OTHER): Payer: Medicare Other | Admitting: Pharmacist

## 2022-03-25 DIAGNOSIS — J410 Simple chronic bronchitis: Secondary | ICD-10-CM

## 2022-03-25 DIAGNOSIS — I10 Essential (primary) hypertension: Secondary | ICD-10-CM

## 2022-03-25 DIAGNOSIS — E7800 Pure hypercholesterolemia, unspecified: Secondary | ICD-10-CM

## 2022-03-25 NOTE — Progress Notes (Signed)
Chronic Care Management Pharmacy Note  03/25/2022 Name:  Christopher [name] MRN:  [id number] DOB:  [birth year]  Summary: addressed HTN, HLD, COPD. All medications are going well. He has previously received anoro ellipta supply via patient assistance, but not until end of year after meeting spend requirement. Patient states he is not currently close to meeting the spend requirement, as of today's call.  Recommendations/Changes made from today's visit:  - No changes, patient doing well - Printed a 2023 [employer] patient assistance application for anoro ellipta, just in case patient hits the required $600 out of pocket spend and needs financial help to obtain his inhalers. It is located in office folder on the side of filing cabinet that contains copay coupon cards.  Plan: f/u with pharmacist in 6-8 months  Subjective: Christopher [name] is an 71 y.o. year old male who is a primary patient of [name], Rene Kocher, MD.  The CCM team was consulted for assistance with disease management and care coordination needs.    Engaged with patient by telephone for follow up visit in response to provider referral for pharmacy case management and/or care coordination services.   Consent to Services:  The patient was given information about Chronic Care Management services, agreed to services, and gave verbal consent prior to initiation of services.  Please see initial visit note for detailed documentation.   Patient Care Team: [name], MD as PCP - General Dr Carlis Abbott (Gastroenterology) Darius Bump, [facility] as Pharmacist (Pharmacist)   Objective:  Lab Results  Component Value Date   CREATININE 1.34 (H) 08/18/2021   CREATININE 1.25 03/17/2021   CREATININE 1.27 (H) 09/12/2020       Component Value Date/Time   CHOL 172 03/17/2021 0929   TRIG 171 (H) 03/17/2021 0929   HDL 41 03/17/2021 0929   CHOLHDL 4.2 03/17/2021 0929   VLDL 17 04/10/2016 0853   LDLCALC 102 (H) 03/17/2021 0929       Latest Ref  Rng & Units 03/17/2021    9:29 AM 03/11/2020    9:49 AM 03/08/2019    9:40 AM  Hepatic Function  Total Protein 6.1 - 8.1 g/dL 8.0  7.0  7.1   AST 10 - 35 U/L '19  23  23   '$ ALT 9 - 46 U/L 22  36  26   Total Bilirubin 0.2 - 1.2 mg/dL 0.4  0.5  0.5     Lab Results  Component Value Date/Time   TSH 1.252 10/13/2012 09:41 AM       Latest Ref Rng & Units 03/17/2021    9:29 AM 04/04/2018    7:47 AM 01/24/2014    8:22 AM  CBC  WBC 3.8 - 10.8 Thousand/uL 7.9  4.1  4.3   Hemoglobin 13.2 - 17.1 g/dL 14.3  14.3  14.6   Hematocrit 38.5 - 50.0 % 42.3  40.8  42.1   Platelets 140 - 400 Thousand/uL 285  226  205     Clinical ASCVD:  The 10-year ASCVD risk score (Arnett DK, et al., 2019) is: 24.5%   Values used to calculate the score:     Age: 71 years     Sex: Male     Is Non-Hispanic African American: No     Diabetic: No     Tobacco smoker: No     Systolic Blood Pressure: 401 mmHg     Is BP treated: Yes     HDL Cholesterol: 41 mg/dL     Total Cholesterol:  172 mg/dL     Social History   Tobacco Use  Smoking Status Former  Smokeless Tobacco Former   Types: Chew  Tobacco Comments   Quit 15 years ago   BP Readings from Last 3 Encounters:  07/15/21 135/77  03/14/21 120/77  09/12/20 132/76   Pulse Readings from Last 3 Encounters:  07/15/21 85  03/14/21 85  09/12/20 88   Wt Readings from Last 3 Encounters:  07/15/21 240 lb (108.9 kg)  03/14/21 230 lb (104.3 kg)  09/12/20 250 lb (113.4 kg)    Assessment: Review of patient past medical history, allergies, medications, health status, including review of consultants reports, laboratory and other test data, was performed as part of comprehensive evaluation and provision of chronic care management services.   SDOH:  (Social Determinants of Health) assessments and interventions performed:  SDOH Interventions    Flowsheet Row Office Visit from 09/12/2020 in [place] Office Visit from 08/28/2020  in [place] Office Visit from 08/16/2019 in [place] Interventions -- Intervention Not Indicated --  Housing Interventions -- Intervention Not Indicated --  Transportation Interventions -- Intervention Not Indicated --  Depression Interventions/Treatment  Currently on Treatment -- Currently on Treatment, PHQ2-9 Score <4 Follow-up Not Indicated  Financial Strain Interventions -- Intervention Not Indicated --  Physical Activity Interventions -- Intervention Not Indicated --  Stress Interventions -- Intervention Not Indicated --  Social Connections Interventions -- Intervention Not Indicated --       CCM Care Plan  Allergies  Allergen Reactions   Lorazepam Other (See Comments)    hallucinations   Nsaids Other (See Comments)    Hx of chronic recurrent GI ulcer.    Onion Nausea And Vomiting   Pravastatin Other (See Comments)    myalgias   Simvastatin Other (See Comments)    myalgias    Medications Reviewed Today     Reviewed by Darius Bump, [facility] (Pharmacist) on 11/12/21 at 1314  Med List Status: <None>   Medication Order Taking? Sig Documenting Provider Last Dose Status Informant  albuterol (PROAIR HFA) 108 (90 Base) MCG/ACT inhaler [id number] Yes Inhale 2 puffs into the lungs every 6 (six) hours as needed for wheezing. [name], MD Taking Active   citalopram (CELEXA) 20 MG tablet [id number] Yes Take 1 tablet by mouth once daily [name], MD Taking Active   lisinopril (ZESTRIL) 10 MG tablet [id number] Yes Take 1 tablet (10 mg total) by mouth daily. [name], MD Taking Active   Multiple Vitamin (MULTIVITAMIN) tablet [id number] Yes Take 1 tablet by mouth daily. [provider] Taking Active   omeprazole (PRILOSEC) 20 MG capsule [id number] Yes Take 1 capsule by mouth once daily [name], MD Taking Active    rosuvastatin (CRESTOR) 10 MG tablet [id number] Yes Take 1 tablet (10 mg total) by mouth daily. [name], MD Taking Active   umeclidinium-vilanterol [facility] ELLIPTA) 62.5-25 MCG/ACT AEPB [id number] Yes Inhale 1 puff by mouth once daily. [name], MD Taking Active             Patient Active Problem List   Diagnosis Date Noted   Centrilobular emphysema ([place]) 07/28/2021   History of pulmonary embolism 07/15/2021   Pulmonary nodule 03/18/2021   Chronic GERD 09/12/2020   Synovial cyst 08/16/2019   Acute bilateral low back pain without sciatica 03/08/2019  Aortic aneurysm ([place]) 07/18/2018   High risk for colon cancer 07/14/2018   Family hx of colon cancer 07/14/2018   Pancreatic cyst 07/14/2018   S/P biliary surgery 07/14/2018   Essential hypertension 04/06/2018   CKD (chronic kidney disease) stage 3, GFR 30-59 ml/min ([place]) 04/12/2016   Depression 01/04/2013   Hyperlipidemia 12/31/2011   Obesity 07/07/2011   COPD (chronic obstructive pulmonary disease) ([place]) 01/16/2011   ANEMIA, IRON DEFICIENCY 02/03/2010   PERFORATION OF INTESTINE 02/03/2010    Immunization History  Administered Date(s) Administered   Fluad Quad(high Dose 65+) 03/08/2019, 03/11/2020, 03/14/2021   Influenza Split 02/23/2012   Influenza Whole 03/05/2010, 01/16/2011   Influenza, High Dose Seasonal PF 03/17/2016, 03/02/2017, 03/22/2018   Influenza,inj,Quad PF,6+ Mos 01/12/2014, 02/04/2015   Influenza-Unspecified 04/10/2013   Moderna SARS-COV2 Booster Vaccination 03/20/2020   Moderna Sars-Covid-2 Vaccination 06/20/2019, 07/18/2019   Pfizer Covid-19 Vaccine Bivalent Booster 63yr & up 03/17/2021   Pneumococcal Conjugate-13 06/13/2015   Pneumococcal Polysaccharide-23 04/01/2010, 09/01/2016   Tdap 01/16/2011, 09/12/2020   Zoster Recombinat (Shingrix) 09/12/2020, 03/04/2021   Zoster, Live 01/01/2011    Conditions to be addressed/monitored: HTN, HLD, and COPD  There are no care plans  that you recently modified to display for this patient.      Medication Assistance: Application for Anoro ellipta  medication assistance program. in process.  Anticipated assistance start date TBD.  See plan of care for additional detail.  Patient's preferred pharmacy is:  [place] [address] [place]Phone: [phone]: [phone] [address]Phone: [phone]: [phone]  Uses pill box? Yes Pt endorses 100% compliance  Follow Up:  Patient agrees to Care Plan and Follow-up.  Plan: Telephone follow up appointment with care management team member scheduled for:  6-8 month  KLarinda Buttery PharmD Clinical Pharmacist CSt [facility] Center[facility] At MS[phone]

## 2022-03-27 ENCOUNTER — Ambulatory Visit: Payer: Medicare Other | Admitting: Family Medicine

## 2022-03-27 ENCOUNTER — Encounter: Payer: Self-pay | Admitting: Family Medicine

## 2022-03-27 ENCOUNTER — Other Ambulatory Visit: Payer: Self-pay | Admitting: *Deleted

## 2022-03-27 VITALS — BP 110/72 | HR 95 | Ht 74.0 in | Wt 240.0 lb

## 2022-03-27 DIAGNOSIS — I959 Hypotension, unspecified: Secondary | ICD-10-CM

## 2022-03-27 DIAGNOSIS — I10 Essential (primary) hypertension: Secondary | ICD-10-CM | POA: Diagnosis not present

## 2022-03-27 DIAGNOSIS — Z23 Encounter for immunization: Secondary | ICD-10-CM

## 2022-03-27 DIAGNOSIS — E7800 Pure hypercholesterolemia, unspecified: Secondary | ICD-10-CM

## 2022-03-27 DIAGNOSIS — F325 Major depressive disorder, single episode, in full remission: Secondary | ICD-10-CM

## 2022-03-27 DIAGNOSIS — N1831 Chronic kidney disease, stage 3a: Secondary | ICD-10-CM | POA: Diagnosis not present

## 2022-03-27 DIAGNOSIS — Z125 Encounter for screening for malignant neoplasm of prostate: Secondary | ICD-10-CM

## 2022-03-27 DIAGNOSIS — J410 Simple chronic bronchitis: Secondary | ICD-10-CM

## 2022-03-27 DIAGNOSIS — D649 Anemia, unspecified: Secondary | ICD-10-CM

## 2022-03-27 MED ORDER — UMECLIDINIUM-VILANTEROL 62.5-25 MCG/ACT IN AEPB: INHALATION_SPRAY | RESPIRATORY_TRACT | 3 refills | Status: DC

## 2022-03-27 MED ORDER — ROSUVASTATIN CALCIUM 10 MG PO TABS: 10.0000 mg | ORAL_TABLET | Freq: Every day | ORAL | 3 refills | Status: DC

## 2022-03-27 MED ORDER — CITALOPRAM HYDROBROMIDE 20 MG PO TABS: 20.0000 mg | ORAL_TABLET | Freq: Every day | ORAL | 3 refills | Status: DC

## 2022-03-27 MED ORDER — ALBUTEROL SULFATE HFA 108 (90 BASE) MCG/ACT IN AERS: 2.0000 | INHALATION_SPRAY | Freq: Four times a day (QID) | RESPIRATORY_TRACT | 1 refills | Status: DC | PRN

## 2022-03-27 NOTE — Assessment & Plan Note (Signed)
Having a little bit of mucus production after recent cold at the end of September.  Just encouraged him to let me know if he does not feel like he is getting completely better.

## 2022-03-27 NOTE — Assessment & Plan Note (Signed)
Due to recheck renal function. 

## 2022-03-27 NOTE — Assessment & Plan Note (Signed)
Due to recheck lipids. 

## 2022-03-27 NOTE — Assessment & Plan Note (Signed)
Pressure this morning was low but on the lower side and it is a little bit lower here today.  He is got a monitor at home in the mornings before he takes his blood pressure pill and if it is low he can always split the tab in half.  He actually was on 5 mg at 1 point in time but then we eventually went back up to 10 mg.  He is also getting back into his walking since being back in the area.

## 2022-03-27 NOTE — Progress Notes (Signed)
Established Patient Office Visit  Subjective   Patient ID: Christopher [name], male    DOB: [birth year]  Age: 71 y.o. MRN: [id number]  Chief Complaint  Patient presents with   Hypertension   Depression    HPI  Hypertension- Pt denies chest pain, SOB, dizziness, or heart palpitations.  Taking meds as directed w/o problems.  Denies medication side effects.    F/U depression - doing well on his citalopram.  No specific concerns overall he feels like he is doing well.  PHQ-9 score of 2 and he only scored that for sleep and low energy.  F/U CKD 3 - labs pended from October.    He did well let me know that he did get a really bad cold at the end of September right before traveling back into the area he said it lasted a good 2 and half to 3 weeks he is finally feeling a lot better but still feels like occasionally has a little bit of mucus in his chest.  Morning he felt a little lightheaded and checked his blood pressure was 110/72 was a little bit low.  But otherwise he has been feeling well.    ROS    Objective:     BP 110/72   Pulse 95   Ht '6\' 2"'$  (1.88 m)   Wt 240 lb (108.9 kg)   SpO2 97%   BMI 30.81 kg/m    Physical Exam Constitutional:      Appearance: He is well-developed.  HENT:     Head: Normocephalic and atraumatic.     Right Ear: External ear normal.     Left Ear: External ear normal.     Nose: Nose normal.  Eyes:     Conjunctiva/sclera: Conjunctivae normal.     Pupils: Pupils are equal, round, and reactive to light.  Neck:     Thyroid: No thyromegaly.  Cardiovascular:     Rate and Rhythm: Normal rate.     Heart sounds: Normal heart sounds.  Pulmonary:     Effort: Pulmonary effort is normal.     Breath sounds: Normal breath sounds. No wheezing.     Comments: Diffuse coarse breath sounds. Musculoskeletal:     Cervical back: Neck supple.  Lymphadenopathy:     Cervical: No cervical adenopathy.  Skin:    General: Skin is warm and dry.  Neurological:      Mental Status: He is alert and oriented to person, place, and time.      No results found for any visits on 03/27/22.    The 10-year ASCVD risk score (Arnett DK, et al., 2019) is: 17.7%    Assessment & Plan:   Problem List Items Addressed This Visit       Cardiovascular and Mediastinum   Essential hypertension - Primary    Pressure this morning was low but on the lower side and it is a little bit lower here today.  He is got a monitor at home in the mornings before he takes his blood pressure pill and if it is low he can always split the tab in half.  He actually was on 5 mg at 1 point in time but then we eventually went back up to 10 mg.  He is also getting back into his walking since being back in the area.      Relevant Medications   rosuvastatin (CRESTOR) 10 MG tablet   Other Relevant Orders   CBC with Differential   COMPLETE METABOLIC PANEL WITH  GFR   Lipid Panel w/reflex Direct LDL   TSH   PSA     Respiratory   COPD (chronic obstructive pulmonary disease) ([place])    Having a little bit of mucus production after recent cold at the end of September.  Just encouraged him to let me know if he does not feel like he is getting completely better.      Relevant Medications   albuterol (PROAIR HFA) 108 (90 Base) MCG/ACT inhaler   umeclidinium-vilanterol (ANORO ELLIPTA) 62.5-25 MCG/ACT AEPB     Genitourinary   CKD (chronic kidney disease) stage 3, GFR 30-59 ml/min (HCC)    Due to recheck renal function.      Relevant Medications   rosuvastatin (CRESTOR) 10 MG tablet   Other Relevant Orders   CBC with Differential   COMPLETE METABOLIC PANEL WITH GFR   Lipid Panel w/reflex Direct LDL   TSH   PSA     Other   Hyperlipidemia    Due to recheck lipids.       Relevant Medications   rosuvastatin (CRESTOR) 10 MG tablet   Other Relevant Orders   CBC with Differential   COMPLETE METABOLIC PANEL WITH GFR   Lipid Panel w/reflex Direct LDL   TSH   PSA   Depression    Relevant Medications   citalopram (CELEXA) 20 MG tablet   Other Visit Diagnoses     Need for immunization against influenza       Relevant Orders   Flu Vaccine QUAD 43moIM (Fluarix, Fluzone & Alfiuria Quad PF) (Completed)   Flu Vaccine QUAD High Dose(Fluad) (Completed)   Screening PSA (prostate specific antigen)       Relevant Orders   CBC with Differential   COMPLETE METABOLIC PANEL WITH GFR   Lipid Panel w/reflex Direct LDL   TSH   PSA   Hypotension, unspecified hypotension type       Relevant Medications   rosuvastatin (CRESTOR) 10 MG tablet   Anemia, unspecified type       Relevant Medications   rosuvastatin (CRESTOR) 10 MG tablet   Encounter for immunization       Relevant Orders   Pfizer Fall 2023 Covid-19 Vaccine 174yrand older (Completed)       Return in about 6 months (around 09/25/2022).    CaBeatrice LecherMD

## 2022-03-30 DIAGNOSIS — I10 Essential (primary) hypertension: Secondary | ICD-10-CM | POA: Diagnosis not present

## 2022-03-30 DIAGNOSIS — E7800 Pure hypercholesterolemia, unspecified: Secondary | ICD-10-CM | POA: Diagnosis not present

## 2022-03-30 DIAGNOSIS — Z125 Encounter for screening for malignant neoplasm of prostate: Secondary | ICD-10-CM | POA: Diagnosis not present

## 2022-03-30 DIAGNOSIS — N1831 Chronic kidney disease, stage 3a: Secondary | ICD-10-CM | POA: Diagnosis not present

## 2022-03-31 LAB — CBC WITH DIFFERENTIAL/PLATELET
Absolute Monocytes: 384 cells/uL (ref 200–950)
Basophils Absolute: 52 cells/uL (ref 0–200)
Basophils Relative: 1.3 %
Eosinophils Absolute: 112 cells/uL (ref 15–500)
Eosinophils Relative: 2.8 %
HCT: 41.4 % (ref 38.5–50.0)
Hemoglobin: 13.8 g/dL (ref 13.2–17.1)
Lymphs Abs: 1016 cells/uL (ref 850–3900)
MCH: 31.6 pg (ref 27.0–33.0)
MCHC: 33.3 g/dL (ref 32.0–36.0)
MCV: 94.7 fL (ref 80.0–100.0)
MPV: 8.9 fL (ref 7.5–12.5)
Monocytes Relative: 9.6 %
Neutro Abs: 2436 cells/uL (ref 1500–7800)
Neutrophils Relative %: 60.9 %
Platelets: 203 10*3/uL (ref 140–400)
RBC: 4.37 10*6/uL (ref 4.20–5.80)
RDW: 11.8 % (ref 11.0–15.0)
Total Lymphocyte: 25.4 %
WBC: 4 10*3/uL (ref 3.8–10.8)

## 2022-03-31 LAB — COMPLETE METABOLIC PANEL WITH GFR
AG Ratio: 1.4 (calc) (ref 1.0–2.5)
ALT: 27 U/L (ref 9–46)
AST: 20 U/L (ref 10–35)
Albumin: 4.6 g/dL (ref 3.6–5.1)
Alkaline phosphatase (APISO): 137 U/L (ref 35–144)
BUN: 18 mg/dL (ref 7–25)
CO2: 29 mmol/L (ref 20–32)
Calcium: 9.7 mg/dL (ref 8.6–10.3)
Chloride: 100 mmol/L (ref 98–110)
Creat: 1.18 mg/dL (ref 0.70–1.28)
Globulin: 3.2 g/dL (calc) (ref 1.9–3.7)
Glucose, Bld: 158 mg/dL — ABNORMAL HIGH (ref 65–99)
Potassium: 5 mmol/L (ref 3.5–5.3)
Sodium: 138 mmol/L (ref 135–146)
Total Bilirubin: 0.7 mg/dL (ref 0.2–1.2)
Total Protein: 7.8 g/dL (ref 6.1–8.1)
eGFR: 66 mL/min/{1.73_m2} (ref >=60)

## 2022-03-31 LAB — LIPID PANEL W/REFLEX DIRECT LDL
Cholesterol: 183 mg/dL (ref <200)
HDL: 41 mg/dL (ref >=40)
LDL Cholesterol (Calc): 104 mg/dL (calc) — ABNORMAL HIGH
Non-HDL Cholesterol (Calc): 142 mg/dL (calc) — ABNORMAL HIGH (ref <130)
Total CHOL/HDL Ratio: 4.5 (calc) (ref <5.0)
Triglycerides: 267 mg/dL — ABNORMAL HIGH (ref <150)

## 2022-03-31 LAB — TSH: TSH: 1.86 mIU/L (ref 0.40–4.50)

## 2022-03-31 LAB — PSA: PSA: 2.26 ng/mL (ref <=4.00)

## 2022-03-31 NOTE — Progress Notes (Signed)
Hi [place], kidney function looks much better back down to 1.1 compared to 7 months ago.  That is good.  LDL just borderline at 104.  Continue work on [employer] and regular exercise.  Your thyroid and blood count are normal.  Prostate test looks good.

## 2022-05-27 ENCOUNTER — Other Ambulatory Visit: Payer: Self-pay | Admitting: Family Medicine

## 2022-05-27 DIAGNOSIS — I10 Essential (primary) hypertension: Secondary | ICD-10-CM

## 2022-08-05 ENCOUNTER — Encounter: Payer: Self-pay | Admitting: Gastroenterology

## 2022-08-27 ENCOUNTER — Other Ambulatory Visit: Payer: Self-pay | Admitting: Family Medicine

## 2022-08-27 DIAGNOSIS — K219 Gastro-esophageal reflux disease without esophagitis: Secondary | ICD-10-CM

## 2022-09-07 ENCOUNTER — Ambulatory Visit (INDEPENDENT_AMBULATORY_CARE_PROVIDER_SITE_OTHER): Payer: Medicare Other | Admitting: Family Medicine

## 2022-09-07 ENCOUNTER — Encounter: Payer: Self-pay | Admitting: Family Medicine

## 2022-09-07 VITALS — BP 131/74 | HR 71 | Ht 74.0 in | Wt 230.0 lb

## 2022-09-07 DIAGNOSIS — R052 Subacute cough: Secondary | ICD-10-CM | POA: Diagnosis not present

## 2022-09-07 DIAGNOSIS — N1831 Chronic kidney disease, stage 3a: Secondary | ICD-10-CM | POA: Diagnosis not present

## 2022-09-07 DIAGNOSIS — I10 Essential (primary) hypertension: Secondary | ICD-10-CM

## 2022-09-07 DIAGNOSIS — J410 Simple chronic bronchitis: Secondary | ICD-10-CM

## 2022-09-07 DIAGNOSIS — M545 Low back pain, unspecified: Secondary | ICD-10-CM | POA: Diagnosis not present

## 2022-09-07 NOTE — Assessment & Plan Note (Signed)
He has had a bump up in cough and sputum production over the last couple of months but no shortness of breath.  It is not keeping him from his activities.  We did discuss that if he develops new or worsening symptoms or if she is not improving then we may need to treat for COPD exacerbation.  I also like to see if he might be a candidate for lung cancer screening.

## 2022-09-07 NOTE — Assessment & Plan Note (Signed)
To recheck renal function. 
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
## 2022-09-07 NOTE — Assessment & Plan Note (Addendum)
Blood pressure little borderline today.  Will recheck it again before he goes but otherwise doing well and he is down 10 pounds which is great. Doing well on half a tab of lisinopril.

## 2022-09-08 LAB — COMPLETE METABOLIC PANEL WITH GFR
AG Ratio: 1.2 (calc) (ref 1.0–2.5)
ALT: 17 U/L (ref 9–46)
AST: 16 U/L (ref 10–35)
Albumin: 4.6 g/dL (ref 3.6–5.1)
Alkaline phosphatase (APISO): 129 U/L (ref 35–144)
BUN: 12 mg/dL (ref 7–25)
CO2: 28 mmol/L (ref 20–32)
Calcium: 9.7 mg/dL (ref 8.6–10.3)
Chloride: 102 mmol/L (ref 98–110)
Creat: 1.27 mg/dL (ref 0.70–1.28)
Globulin: 3.8 g/dL (calc) — ABNORMAL HIGH (ref 1.9–3.7)
Glucose, Bld: 125 mg/dL — ABNORMAL HIGH (ref 65–99)
Potassium: 4.5 mmol/L (ref 3.5–5.3)
Sodium: 139 mmol/L (ref 135–146)
Total Bilirubin: 0.5 mg/dL (ref 0.2–1.2)
Total Protein: 8.4 g/dL — ABNORMAL HIGH (ref 6.1–8.1)
eGFR: 60 mL/min/{1.73_m2} (ref >=60)

## 2022-09-08 LAB — MICROALBUMIN / CREATININE URINE RATIO
Creatinine, Urine: 92 mg/dL (ref 20–320)
Microalb Creat Ratio: 7 mg/g creat (ref <30)
Microalb, Ur: 0.6 mg/dL

## 2022-09-08 LAB — TSH: TSH: 1.19 mIU/L (ref 0.40–4.50)

## 2022-09-08 NOTE — Progress Notes (Signed)
Hi [place], kidney function is stable.  Protein level went up a little bit which is interesting.  Normally it has been in the normal range.  Will keep an eye on that and plan to check again in 2 to 3 months.  He looks great.  No excess protein in the urine

## 2022-09-29 ENCOUNTER — Ambulatory Visit (INDEPENDENT_AMBULATORY_CARE_PROVIDER_SITE_OTHER): Payer: Medicare Other | Admitting: Family Medicine

## 2022-09-29 DIAGNOSIS — Z0000 Encounter for general adult medical examination without abnormal findings: Secondary | ICD-10-CM | POA: Diagnosis not present

## 2022-09-29 DIAGNOSIS — Z1211 Encounter for screening for malignant neoplasm of colon: Secondary | ICD-10-CM | POA: Diagnosis not present
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
## 2022-09-29 NOTE — Patient Instructions (Signed)
Health Maintenance, Male Adopting a healthy lifestyle and getting preventive care are important in promoting health and wellness. Ask your health care provider about: The right schedule for you to have regular tests and exams. Things you can do on your own to prevent diseases and keep yourself healthy. What should I know about diet, weight, and exercise? Eat a healthy diet  Eat a diet that includes plenty of vegetables, fruits, low-fat dairy products, and lean protein. Do not eat a lot of foods that are high in solid fats, added sugars, or sodium. Maintain a healthy weight Body mass index (BMI) is a measurement that can be used to identify possible weight problems. It estimates body fat based on height and weight. Your health care provider can help determine your BMI and help you achieve or maintain a healthy weight. Get regular exercise Get regular exercise. This is one of the most important things you can do for your health. Most adults should: Exercise for at least 150 minutes each week. The exercise should increase your heart rate and make you sweat (moderate-intensity exercise). Do strengthening exercises at least twice a week. This is in addition to the moderate-intensity exercise. Spend less time sitting. Even light physical activity can be beneficial. Watch cholesterol and blood lipids Have your blood tested for lipids and cholesterol at 73 years of age, then have this test every 5 years. You may need to have your cholesterol levels checked more often if: Your lipid or cholesterol levels are high. You are older than 73 years of age. You are at high risk for heart disease. What should I know about cancer screening? Many types of cancers can be detected early and may often be prevented. Depending on your health history and family history, you may need to have cancer screening at various ages. This may include screening for: Colorectal cancer. Prostate cancer. Skin cancer. Lung  cancer. What should I know about heart disease, diabetes, and high blood pressure? Blood pressure and heart disease High blood pressure causes heart disease and increases the risk of stroke. This is more likely to develop in people who have high blood pressure readings or are overweight. Talk with your health care provider about your target blood pressure readings. Have your blood pressure checked: Every 3-5 years if you are 18-39 years of age. Every year if you are 40 years old or older. If you are between the ages of 65 and 75 and are a current or former smoker, ask your health care provider if you should have a one-time screening for abdominal aortic aneurysm (AAA). Diabetes Have regular diabetes screenings. This checks your fasting blood sugar level. Have the screening done: Once every three years after age 45 if you are at a normal weight and have a low risk for diabetes. More often and at a younger age if you are overweight or have a high risk for diabetes. What should I know about preventing infection? Hepatitis B If you have a higher risk for hepatitis B, you should be screened for this virus. Talk with your health care provider to find out if you are at risk for hepatitis B infection. Hepatitis C Blood testing is recommended for: Everyone born from 1945 through 1965. Anyone with known risk factors for hepatitis C. Sexually transmitted infections (STIs) You should be screened each year for STIs, including gonorrhea and chlamydia, if: You are sexually active and are younger than 73 years of age. You are older than 73 years of age and your   health care provider tells you that you are at risk for this type of infection. Your sexual activity has changed since you were last screened, and you are at increased risk for chlamydia or gonorrhea. Ask your health care provider if you are at risk. Ask your health care provider about whether you are at high risk for HIV. Your health care provider  may recommend a prescription medicine to help prevent HIV infection. If you choose to take medicine to prevent HIV, you should first get tested for HIV. You should then be tested every 3 months for as long as you are taking the medicine. Follow these instructions at home: Alcohol use Do not drink alcohol if your health care provider tells you not to drink. If you drink alcohol: Limit how much you have to 0-2 drinks a day. Know how much alcohol is in your drink. In the [place] one drink equals one 12 oz bottle of beer (355 mL), one 5 oz glass of wine (148 mL), or one 1 oz glass of hard liquor (44 mL). Lifestyle Do not use any products that contain nicotine or tobacco. These products include cigarettes, chewing tobacco, and vaping devices, such as e-cigarettes. If you need help quitting, ask your health care provider. Do not use street drugs. Do not share needles. Ask your health care provider for help if you need support or information about quitting drugs. General instructions Schedule regular health, dental, and eye exams. Stay current with your vaccines. Tell your health care provider if: You often feel depressed. You have ever been abused or do not feel safe at home. Summary Adopting a healthy lifestyle and getting preventive care are important in promoting health and wellness. Follow your health care provider's instructions about healthy diet, exercising, and getting tested or screened for diseases. Follow your health care provider's instructions on monitoring your cholesterol and blood pressure. This information is not intended to replace advice given to you by your health care provider. Make sure you discuss any questions you have with your health care provider. Document Revised: 09/30/2020 Document Reviewed: 09/30/2020 Elsevier Patient Education  2023 Elsevier Inc.  

## 2022-10-08 ENCOUNTER — Telehealth: Payer: Self-pay | Admitting: Gastroenterology

## 2022-10-08 NOTE — Telephone Encounter (Signed)
Hi Dr. Meridee Score,   Patient called to schedule his recall colonoscopy for 2023. He had a endoscopy at [facility] in [place]. He states he lives there during the fall and Winter and returns back to [place] during the summer months. States while he is here he would like to continue his care with you. His recent records are in [facility] for you to review and advise on scheduling.   Thank you.

## 2022-10-09 ENCOUNTER — Telehealth: Payer: Self-pay | Admitting: Gastroenterology

## 2022-10-09 DIAGNOSIS — K862 Cyst of pancreas: Secondary | ICD-10-CM

## 2022-10-09 NOTE — Telephone Encounter (Signed)
CT order has been entered and sent to the schedulers.  [place] can you please set up colon and previsit. Thank you

## 2022-10-09 NOTE — Telephone Encounter (Signed)
I am comfortable with the patient returning to my care. 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 He needs updated imaging of his abdomen. I would recommend the following: 1) CT abdomen/pelvis pancreas protocol to be performed 2) can proceed with scheduling colonoscopy 3) try to obtain records from what ever hospital he was transferred as the only GI records from 2022 for the EGD suggest that he was transferred to another hospital and we have no records of that in the system.   

## 2022-10-09 NOTE — Telephone Encounter (Signed)
Called patient to schedule colonoscopy. Stated he would have to schedule in October. Informed him our scheduled is not that far out yet. States he will give a call back at a later time to schedule.

## 2022-10-09 NOTE — Telephone Encounter (Signed)
Spoke with patient regarding recommendations 

## 2022-10-09 NOTE — Telephone Encounter (Signed)
Called patient regarding Dr. Meridee Score recommendations in transfer of care phone note. Patient is requesting a call back to get scheduled for the CT. Also stated that he is going to try to get previous records faxed over. Please advise, thank you.

## 2022-10-09 NOTE — Telephone Encounter (Signed)
I am comfortable with the patient returning to my care. 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 He needs updated imaging of his abdomen. I would recommend the following: 1) CT abdomen/pelvis pancreas protocol to be performed 2) can proceed with scheduling colonoscopy 3) try to obtain records from what ever hospital he was transferred as the only GI records from 2022 for the EGD suggest that he was transferred to another hospital and we have no records of that in the system.

## 2022-12-23 ENCOUNTER — Other Ambulatory Visit: Payer: Self-pay | Admitting: Family Medicine

## 2022-12-23 DIAGNOSIS — J410 Simple chronic bronchitis: Secondary | ICD-10-CM

## 2023-01-06 ENCOUNTER — Other Ambulatory Visit: Payer: Self-pay | Admitting: *Deleted

## 2023-01-06 ENCOUNTER — Telehealth: Payer: Self-pay | Admitting: Family Medicine

## 2023-01-06 DIAGNOSIS — I10 Essential (primary) hypertension: Secondary | ICD-10-CM

## 2023-01-06 MED ORDER — LISINOPRIL 5 MG PO TABS: 5.0000 mg | ORAL_TABLET | Freq: Every day | ORAL | 1 refills | Status: DC

## 2023-01-06 NOTE — Telephone Encounter (Signed)
Medication updated to Lisinopril 5 mg and sent to pt's pharmacy. 10 mg discontinued.

## 2023-01-06 NOTE — Telephone Encounter (Signed)
Christopher [name] From [employer] called in to verify the patient was to told take 5mg  Linsinopril instead of 10mg . If so, can we update the proscription. Please advise.   Delice Bison N.-[employer] [phone]. [phone]

## 2023-01-19 ENCOUNTER — Encounter: Payer: Self-pay | Admitting: Gastroenterology

## 2023-03-01 ENCOUNTER — Other Ambulatory Visit: Payer: Self-pay | Admitting: Family Medicine

## 2023-03-01 DIAGNOSIS — E7800 Pure hypercholesterolemia, unspecified: Secondary | ICD-10-CM

## 2023-03-01 DIAGNOSIS — D649 Anemia, unspecified: Secondary | ICD-10-CM

## 2023-03-01 DIAGNOSIS — K219 Gastro-esophageal reflux disease without esophagitis: Secondary | ICD-10-CM

## 2023-03-01 DIAGNOSIS — I10 Essential (primary) hypertension: Secondary | ICD-10-CM

## 2023-03-01 DIAGNOSIS — N1831 Chronic kidney disease, stage 3a: Secondary | ICD-10-CM

## 2023-03-01 DIAGNOSIS — F325 Major depressive disorder, single episode, in full remission: Secondary | ICD-10-CM

## 2023-03-08 DIAGNOSIS — K861 Other chronic pancreatitis: Secondary | ICD-10-CM | POA: Diagnosis not present

## 2023-03-09 ENCOUNTER — Encounter: Payer: Self-pay | Admitting: Gastroenterology

## 2023-03-09 ENCOUNTER — Ambulatory Visit (INDEPENDENT_AMBULATORY_CARE_PROVIDER_SITE_OTHER): Payer: Medicare Other | Admitting: Family Medicine

## 2023-03-09 ENCOUNTER — Encounter: Payer: Self-pay | Admitting: Family Medicine

## 2023-03-09 ENCOUNTER — Ambulatory Visit (AMBULATORY_SURGERY_CENTER): Payer: Medicare Other

## 2023-03-09 VITALS — BP 134/72 | HR 72 | Ht 73.0 in | Wt 234.0 lb

## 2023-03-09 VITALS — Ht 74.0 in | Wt 234.0 lb

## 2023-03-09 DIAGNOSIS — J432 Centrilobular emphysema: Secondary | ICD-10-CM

## 2023-03-09 DIAGNOSIS — F325 Major depressive disorder, single episode, in full remission: Secondary | ICD-10-CM

## 2023-03-09 DIAGNOSIS — I10 Essential (primary) hypertension: Secondary | ICD-10-CM | POA: Diagnosis not present

## 2023-03-09 DIAGNOSIS — I7143 Infrarenal abdominal aortic aneurysm, without rupture: Secondary | ICD-10-CM

## 2023-03-09 DIAGNOSIS — J410 Simple chronic bronchitis: Secondary | ICD-10-CM | POA: Diagnosis not present

## 2023-03-09 DIAGNOSIS — R7309 Other abnormal glucose: Secondary | ICD-10-CM

## 2023-03-09 DIAGNOSIS — Z23 Encounter for immunization: Secondary | ICD-10-CM

## 2023-03-09 DIAGNOSIS — Z800 Family history of malignant neoplasm of digestive organs: Secondary | ICD-10-CM

## 2023-03-09 DIAGNOSIS — N1831 Chronic kidney disease, stage 3a: Secondary | ICD-10-CM

## 2023-03-09 MED ORDER — ANORO ELLIPTA 62.5-25 MCG/ACT IN AEPB
1.0000 | INHALATION_SPRAY | Freq: Every day | RESPIRATORY_TRACT | 3 refills | Status: DC
Start: 2023-03-09 — End: 2023-12-13

## 2023-03-09 NOTE — Assessment & Plan Note (Signed)
Continue Anoro and as needed albuterol.

## 2023-03-09 NOTE — Assessment & Plan Note (Signed)
Continue low-dose lisinopril and checking renal function every 6 months.

## 2023-03-09 NOTE — Assessment & Plan Note (Signed)
He is scheduled for CT abdomen pelvis at the end of the month so I am hopeful that they can get a look at the aneurysm if not then we will get do a follow-up with ultrasound as he is due for his 3-year checkup.  Previous measurements of 3.1 cm infrarenal.

## 2023-03-09 NOTE — Assessment & Plan Note (Signed)
Encouraged him to get his RSV vaccine.  We also really need to get a spirometry up-to-date.  Maybe we can schedule that in a few months.

## 2023-03-09 NOTE — Assessment & Plan Note (Signed)
Continue current dose of citalopram 20 mg daily.  Follow-up in 6 months.

## 2023-03-09 NOTE — Progress Notes (Signed)

## 2023-03-09 NOTE — Progress Notes (Signed)
Established Patient Office Visit  Subjective   Patient ID: Dung Salinger, male    DOB: 08-06-1950  Age: 72 y.o. MRN: [id number]  Chief Complaint  Patient presents with   Hypertension    HPI Hypertension- Pt denies chest pain, SOB, dizziness, or heart palpitations.  Taking meds as directed w/o problems.  Denies medication side effects.    He lives with his daughter Carollee Herter and they recently moved so he still has a lot of unpacking boxes.  COPD-he is on Anoro and doing well.  No specific concerns.  He did ask about the RSV vaccine.    ROS    Objective:     BP 134/72   Pulse 72   Ht 6\' 1"  (1.854 m)   Wt 234 lb (106.1 kg)   SpO2 99%   BMI 30.87 kg/m    Physical Exam Vitals and nursing note reviewed.  Constitutional:      Appearance: Normal appearance.  HENT:     Head: Normocephalic and atraumatic.  Eyes:     Conjunctiva/sclera: Conjunctivae normal.  Cardiovascular:     Rate and Rhythm: Normal rate and regular rhythm.  Pulmonary:     Effort: Pulmonary effort is normal.     Breath sounds: Normal breath sounds.  Skin:    General: Skin is warm and dry.  Neurological:     Mental Status: He is alert.  Psychiatric:        Mood and Affect: Mood normal.      No results found for any visits on 03/09/23.    The ASCVD Risk score (Arnett DK, et al., 2019) failed to calculate for the following reasons:   The patient has a prior MI or stroke diagnosis    Assessment & Plan:   Problem List Items Addressed This Visit       Cardiovascular and Mediastinum   Essential hypertension   Relevant Orders   CMP14+EGFR   Hemoglobin A1c   Aortic aneurysm (HCC)    He is scheduled for CT abdomen pelvis at the end of the month so I am hopeful that they can get a look at the aneurysm if not then we will get do a follow-up with ultrasound as he is due for his 3-year checkup.  Previous measurements of 3.1 cm infrarenal.        Respiratory   COPD (chronic obstructive  pulmonary disease) (HCC)    Encouraged him to get his RSV vaccine.  We also really need to get a spirometry up-to-date.  Maybe we can schedule that in a few months.      Relevant Medications   umeclidinium-vilanterol (ANORO ELLIPTA) 62.5-25 MCG/ACT AEPB   Centrilobular emphysema (HCC)    Continue Anoro and as needed albuterol.      Relevant Medications   umeclidinium-vilanterol (ANORO ELLIPTA) 62.5-25 MCG/ACT AEPB     Genitourinary   CKD (chronic kidney disease) stage 3, GFR 30-59 ml/min (HCC)    Continue low-dose lisinopril and checking renal function every 6 months.        Other   Depression    Continue current dose of citalopram 20 mg daily.  Follow-up in 6 months.      Other Visit Diagnoses     Encounter for immunization    -  Primary   Relevant Orders   Flu Vaccine Trivalent High Dose (Fluad) (Completed)   Pfizer Comirnaty Covid-19 Vaccine 34yrs & older (Completed)   Abnormal glucose       Relevant Orders  CMP14+EGFR   Hemoglobin A1c       Return in about 6 months (around 09/07/2023) for htn.    Nani Gasser, MD

## 2023-03-10 ENCOUNTER — Other Ambulatory Visit: Payer: Self-pay | Admitting: *Deleted

## 2023-03-10 DIAGNOSIS — R7989 Other specified abnormal findings of blood chemistry: Secondary | ICD-10-CM

## 2023-03-10 LAB — HEMOGLOBIN A1C
Est. average glucose Bld gHb Est-mCnc: 134 mg/dL
Hgb A1c MFr Bld: 6.3 % — ABNORMAL HIGH (ref 4.8–5.6)

## 2023-03-10 LAB — CMP14+EGFR
ALT: 29 [IU]/L (ref 0–44)
AST: 23 [IU]/L (ref 0–40)
Albumin: 4.6 g/dL (ref 3.8–4.8)
Alkaline Phosphatase: 141 [IU]/L — ABNORMAL HIGH (ref 44–121)
BUN/Creatinine Ratio: 8 — ABNORMAL LOW (ref 10–24)
BUN: 12 mg/dL (ref 8–27)
Bilirubin Total: 0.5 mg/dL (ref 0.0–1.2)
CO2: 23 mmol/L (ref 20–29)
Calcium: 9.2 mg/dL (ref 8.6–10.2)
Chloride: 100 mmol/L (ref 96–106)
Creatinine, Ser: 1.43 mg/dL — ABNORMAL HIGH (ref 0.76–1.27)
Globulin, Total: 2.7 g/dL (ref 1.5–4.5)
Glucose: 137 mg/dL — ABNORMAL HIGH (ref 70–99)
Potassium: 4.7 mmol/L (ref 3.5–5.2)
Sodium: 138 mmol/L (ref 134–144)
Total Protein: 7.3 g/dL (ref 6.0–8.5)
eGFR: 52 mL/min/{1.73_m2} — ABNORMAL LOW (ref >59)

## 2023-03-10 NOTE — Progress Notes (Signed)
Hi Christopher [name], kidney function is up a little bit, normally you are renal function is around 1.2 this time it was 1.4 which is a pretty big jump.  I want to recheck that in about 2 weeks.  Also the alkaline phosphatase was a little elevated this is technically a liver function so I want a recheck that in 2 weeks as well.  It is not in a worrisome range but I do want to keep an eye on it and recheck it.  Your A1c is 6.3 which is in the prediabetes range.  So just want you to work on cutting back on sweets and carbs and we will plan to recheck that again after the new year.

## 2023-03-22 ENCOUNTER — Other Ambulatory Visit (HOSPITAL_COMMUNITY): Payer: Medicare Other

## 2023-03-23 ENCOUNTER — Encounter: Payer: Medicare Other | Admitting: Gastroenterology

## 2023-03-30 DIAGNOSIS — R7989 Other specified abnormal findings of blood chemistry: Secondary | ICD-10-CM | POA: Diagnosis not present

## 2023-03-31 LAB — CMP14+EGFR
ALT: 30 [IU]/L (ref 0–44)
AST: 23 [IU]/L (ref 0–40)
Albumin: 4.8 g/dL (ref 3.8–4.8)
Alkaline Phosphatase: 151 [IU]/L — ABNORMAL HIGH (ref 44–121)
BUN/Creatinine Ratio: 10 (ref 10–24)
BUN: 13 mg/dL (ref 8–27)
Bilirubin Total: 0.5 mg/dL (ref 0.0–1.2)
CO2: 25 mmol/L (ref 20–29)
Calcium: 9.2 mg/dL (ref 8.6–10.2)
Chloride: 100 mmol/L (ref 96–106)
Creatinine, Ser: 1.34 mg/dL — ABNORMAL HIGH (ref 0.76–1.27)
Globulin, Total: 2.9 g/dL (ref 1.5–4.5)
Glucose: 144 mg/dL — ABNORMAL HIGH (ref 70–99)
Potassium: 4.8 mmol/L (ref 3.5–5.2)
Sodium: 139 mmol/L (ref 134–144)
Total Protein: 7.7 g/dL (ref 6.0–8.5)
eGFR: 56 mL/min/{1.73_m2} — ABNORMAL LOW (ref >59)

## 2023-03-31 NOTE — Progress Notes (Signed)
Hi Christopher [name], your kidney function looks a little better at 1.3 compared to 1.4.  will recheck in 3 months

## 2023-04-19 DIAGNOSIS — E872 Acidosis, unspecified: Secondary | ICD-10-CM | POA: Diagnosis not present

## 2023-04-19 DIAGNOSIS — J432 Centrilobular emphysema: Secondary | ICD-10-CM | POA: Diagnosis not present

## 2023-04-19 DIAGNOSIS — R0602 Shortness of breath: Secondary | ICD-10-CM | POA: Diagnosis not present

## 2023-04-19 DIAGNOSIS — I129 Hypertensive chronic kidney disease with stage 1 through stage 4 chronic kidney disease, or unspecified chronic kidney disease: Secondary | ICD-10-CM | POA: Diagnosis not present

## 2023-04-19 DIAGNOSIS — N281 Cyst of kidney, acquired: Secondary | ICD-10-CM | POA: Diagnosis not present

## 2023-04-19 DIAGNOSIS — K859 Acute pancreatitis without necrosis or infection, unspecified: Secondary | ICD-10-CM | POA: Diagnosis not present

## 2023-04-19 DIAGNOSIS — Z6825 Body mass index (BMI) 25.0-25.9, adult: Secondary | ICD-10-CM | POA: Diagnosis not present

## 2023-04-19 DIAGNOSIS — K838 Other specified diseases of biliary tract: Secondary | ICD-10-CM | POA: Diagnosis not present

## 2023-04-19 DIAGNOSIS — K862 Cyst of pancreas: Secondary | ICD-10-CM | POA: Diagnosis not present

## 2023-04-19 DIAGNOSIS — N400 Enlarged prostate without lower urinary tract symptoms: Secondary | ICD-10-CM | POA: Diagnosis not present

## 2023-04-19 DIAGNOSIS — R918 Other nonspecific abnormal finding of lung field: Secondary | ICD-10-CM | POA: Diagnosis not present

## 2023-04-19 DIAGNOSIS — N1831 Chronic kidney disease, stage 3a: Secondary | ICD-10-CM | POA: Diagnosis not present

## 2023-04-19 DIAGNOSIS — R1013 Epigastric pain: Secondary | ICD-10-CM | POA: Diagnosis not present

## 2023-04-19 DIAGNOSIS — F419 Anxiety disorder, unspecified: Secondary | ICD-10-CM | POA: Diagnosis not present

## 2023-04-19 DIAGNOSIS — K279 Peptic ulcer, site unspecified, unspecified as acute or chronic, without hemorrhage or perforation: Secondary | ICD-10-CM | POA: Diagnosis not present

## 2023-04-19 DIAGNOSIS — E860 Dehydration: Secondary | ICD-10-CM | POA: Diagnosis not present

## 2023-04-19 DIAGNOSIS — Z7409 Other reduced mobility: Secondary | ICD-10-CM | POA: Diagnosis not present

## 2023-04-19 DIAGNOSIS — D72829 Elevated white blood cell count, unspecified: Secondary | ICD-10-CM | POA: Diagnosis not present

## 2023-04-19 DIAGNOSIS — Z1152 Encounter for screening for COVID-19: Secondary | ICD-10-CM | POA: Diagnosis not present

## 2023-04-19 DIAGNOSIS — E663 Overweight: Secondary | ICD-10-CM | POA: Diagnosis not present

## 2023-04-19 DIAGNOSIS — R7303 Prediabetes: Secondary | ICD-10-CM | POA: Diagnosis not present

## 2023-04-19 DIAGNOSIS — E785 Hyperlipidemia, unspecified: Secondary | ICD-10-CM | POA: Diagnosis not present

## 2023-04-19 DIAGNOSIS — F32A Depression, unspecified: Secondary | ICD-10-CM | POA: Diagnosis not present

## 2023-04-19 DIAGNOSIS — I7143 Infrarenal abdominal aortic aneurysm, without rupture: Secondary | ICD-10-CM | POA: Diagnosis not present

## 2023-04-19 DIAGNOSIS — K219 Gastro-esophageal reflux disease without esophagitis: Secondary | ICD-10-CM | POA: Diagnosis not present

## 2023-04-19 DIAGNOSIS — R000 Tachycardia, unspecified: Secondary | ICD-10-CM | POA: Diagnosis not present

## 2023-04-19 DIAGNOSIS — F418 Other specified anxiety disorders: Secondary | ICD-10-CM | POA: Diagnosis not present

## 2023-04-19 DIAGNOSIS — K5730 Diverticulosis of large intestine without perforation or abscess without bleeding: Secondary | ICD-10-CM | POA: Diagnosis not present

## 2023-04-20 DIAGNOSIS — D72829 Elevated white blood cell count, unspecified: Secondary | ICD-10-CM | POA: Diagnosis not present

## 2023-04-20 DIAGNOSIS — K862 Cyst of pancreas: Secondary | ICD-10-CM | POA: Diagnosis not present

## 2023-04-20 DIAGNOSIS — K859 Acute pancreatitis without necrosis or infection, unspecified: Secondary | ICD-10-CM | POA: Diagnosis not present

## 2023-04-21 DIAGNOSIS — K859 Acute pancreatitis without necrosis or infection, unspecified: Secondary | ICD-10-CM | POA: Diagnosis not present

## 2023-04-21 DIAGNOSIS — D72829 Elevated white blood cell count, unspecified: Secondary | ICD-10-CM | POA: Diagnosis not present

## 2023-04-21 DIAGNOSIS — K862 Cyst of pancreas: Secondary | ICD-10-CM | POA: Diagnosis not present

## 2023-04-22 DIAGNOSIS — K862 Cyst of pancreas: Secondary | ICD-10-CM | POA: Diagnosis not present

## 2023-04-22 DIAGNOSIS — K859 Acute pancreatitis without necrosis or infection, unspecified: Secondary | ICD-10-CM | POA: Diagnosis not present

## 2023-04-22 DIAGNOSIS — D72829 Elevated white blood cell count, unspecified: Secondary | ICD-10-CM | POA: Diagnosis not present

## 2023-04-23 DIAGNOSIS — K862 Cyst of pancreas: Secondary | ICD-10-CM | POA: Diagnosis not present

## 2023-04-23 DIAGNOSIS — K859 Acute pancreatitis without necrosis or infection, unspecified: Secondary | ICD-10-CM | POA: Diagnosis not present

## 2023-04-23 DIAGNOSIS — D72829 Elevated white blood cell count, unspecified: Secondary | ICD-10-CM | POA: Diagnosis not present

## 2023-04-24 DIAGNOSIS — K859 Acute pancreatitis without necrosis or infection, unspecified: Secondary | ICD-10-CM | POA: Diagnosis not present

## 2023-04-24 DIAGNOSIS — K862 Cyst of pancreas: Secondary | ICD-10-CM | POA: Diagnosis not present

## 2023-04-24 DIAGNOSIS — D72829 Elevated white blood cell count, unspecified: Secondary | ICD-10-CM | POA: Diagnosis not present

## 2023-04-26 ENCOUNTER — Encounter: Payer: Self-pay | Admitting: Family Medicine

## 2023-04-26 ENCOUNTER — Telehealth: Payer: Self-pay

## 2023-04-26 NOTE — Telephone Encounter (Signed)
Can we move him to this week, ok to use Same Day slot

## 2023-04-26 NOTE — Transitions of Care (Post Inpatient/ED Visit) (Signed)
04/26/2023  Name: Christopher [name] MRN: [id number] DOB: 04-Apr-1951  Today's TOC FU Call Status: Today's TOC FU Call Status:: Successful TOC FU Call Completed TOC FU Call Complete Date: 04/26/23 Patient's Name and Date of Birth confirmed.  Transition Care Management Follow-up Telephone Call Date of Discharge: 04/25/23 Discharge Facility: Other ([facility]) Name of Other ([facility]) Discharge Facility: [facility] Type of Discharge: Inpatient Admission Primary Inpatient Discharge Diagnosis:: Acute Pancreatitis How have you been since you were released from the hospital?: Better Any questions or concerns?: No  Items Reviewed: Did you receive and understand the discharge instructions provided?: Yes Medications obtained,verified, and reconciled?: Yes (Medications Reviewed) Any new allergies since your discharge?: No Dietary orders reviewed?: Yes Type of Diet Ordered:: GI Soft, low fiber diet Do you have support at home?: Yes People in Home: child(ren), adult Name of Support/Comfort Primary Source: Carollee Herter  Medications Reviewed Today: Medications Reviewed Today     Reviewed by Jodelle Gross, RN (Case Manager) on 04/26/23 at 1006  Med List Status: <None>   Medication Order Taking? Sig Documenting Provider Last Dose Status Informant  albuterol (PROAIR HFA) 108 (90 Base) MCG/ACT inhaler [id number] Yes Inhale 2 puffs into the lungs every 6 (six) hours as needed for wheezing. Agapito Games, MD Taking Active   citalopram (CELEXA) 20 MG tablet [id number] Yes Take 1 tablet (20 mg total) by mouth daily. Agapito Games, MD Taking Active   lisinopril (ZESTRIL) 10 MG tablet [id number] Yes Take 10 mg by mouth daily. [provider] Taking Active   lisinopril (ZESTRIL) 5 MG tablet [id number] No Take 1 tablet (5 mg total) by mouth daily.  Patient not taking: Reported on 04/26/2023   Agapito Games, MD Not Taking Active            Med Note Electa Sniff [facility]   Mon Apr 26, 2023 10:05 AM) Dosage changed 04/25/23 hospital discharge  Multiple Vitamin (MULTIVITAMIN) tablet [id number] Yes Take 1 tablet by mouth daily. [provider] Taking Active   omeprazole (PRILOSEC) 20 MG capsule [id number] No Take 1 capsule by mouth once daily  Patient not taking: Reported on 04/26/2023   Agapito Games, MD Not Taking Active            Med Note Electa Sniff [facility]   Mon Apr 26, 2023 10:05 AM) Dosage changed 04/25/23 hospital discharge  omeprazole (PRILOSEC) 40 MG capsule [id number] Yes Take 40 mg by mouth daily. [provider] Taking Active   rosuvastatin (CRESTOR) 10 MG tablet [id number] Yes Take 1 tablet (10 mg total) by mouth daily. Agapito Games, MD Taking Active   umeclidinium-vilanterol [facility] ELLIPTA) 62.5-25 MCG/ACT AEPB [id number] Yes Inhale 1 puff into the lungs daily. Agapito Games, MD Taking Active             Home Care and Equipment/Supplies: Were Home Health Services Ordered?: No Any new equipment or medical supplies ordered?: No  Functional Questionnaire: Do you need assistance with bathing/showering or dressing?: No Do you need assistance with meal preparation?: No Do you need assistance with eating?: No Do you have difficulty maintaining continence: No Do you need assistance with getting out of bed/getting out of a chair/moving?: No Do you have difficulty managing or taking your medications?: No  Follow up appointments reviewed: PCP Follow-up appointment confirmed?: No (Patients daughter is calling this morning to schedule HFU) MD Provider Line [phone] Given: No Specialist Hospital Follow-up appointment confirmed?: NA Do you need transportation to your follow-up appointment?: No Do you understand  care options if your condition(s) worsen?: Yes-patient verbalized understanding  SDOH Interventions Today    Flowsheet Row Most Recent Value  SDOH Interventions   Food Insecurity Interventions Intervention Not  Indicated  Housing Interventions Intervention Not Indicated  Transportation Interventions Intervention Not Indicated  Utilities Interventions Intervention Not Indicated     Jodelle Gross RN, BSN, CCM RN Care Manager  Transitions of Care  [facility]  [phone]

## 2023-04-27 NOTE — Telephone Encounter (Signed)
Patient scheduled.

## 2023-05-03 ENCOUNTER — Encounter: Payer: Self-pay | Admitting: Family Medicine

## 2023-05-03 ENCOUNTER — Ambulatory Visit (INDEPENDENT_AMBULATORY_CARE_PROVIDER_SITE_OTHER): Payer: Medicare Other | Admitting: Family Medicine

## 2023-05-03 VITALS — BP 125/88 | HR 96 | Ht 74.0 in | Wt 222.0 lb

## 2023-05-03 DIAGNOSIS — I7143 Infrarenal abdominal aortic aneurysm, without rupture: Secondary | ICD-10-CM | POA: Diagnosis not present

## 2023-05-03 DIAGNOSIS — I10 Essential (primary) hypertension: Secondary | ICD-10-CM | POA: Diagnosis not present

## 2023-05-03 DIAGNOSIS — K8500 Idiopathic acute pancreatitis without necrosis or infection: Secondary | ICD-10-CM

## 2023-05-03 DIAGNOSIS — K219 Gastro-esophageal reflux disease without esophagitis: Secondary | ICD-10-CM

## 2023-05-03 DIAGNOSIS — K869 Disease of pancreas, unspecified: Secondary | ICD-10-CM | POA: Insufficient documentation

## 2023-05-03 NOTE — Assessment & Plan Note (Signed)
Looks like the lesion in the pancreas is pretty stable there was a CT back in 2022 showing it was 2.5 x 3.1 cm.  CT this past month measured the lesion around 3.1 cm so I think that is pretty stable.  Recommend that we keep a close eye on it may be repeat imaging in 1 year.

## 2023-05-03 NOTE — Assessment & Plan Note (Addendum)
BP at goal  Infrarenal 3.1 cm Abdominal Aortic Aneurysm (ICD10-I71.9). Done 03/2023 Recommend follow-up aortic ultrasound in 3 years.

## 2023-05-03 NOTE — Patient Instructions (Signed)
Blood pressure looks good today but send me a few more blood pressures over the next week.

## 2023-05-03 NOTE — Assessment & Plan Note (Signed)
Christopher [name] out this month of the omeprazole 40 mg and then go back down to 20 mg.

## 2023-05-03 NOTE — Assessment & Plan Note (Signed)
Pressure looks great today on increased dose of lisinopril but he is getting some higher pressures like in the 130s and 150s at home continue to monitor for another week and send those numbers to me we can always make an adjustment if needed.

## 2023-05-03 NOTE — Progress Notes (Signed)
Established Patient Office Visit  Subjective   Patient ID: Christopher [name], male    DOB: 08/06/1950  Age: 72 y.o. MRN: [id number]  Chief Complaint  Patient presents with   Hospitalization Follow-up    HPI  He was admitted to [facility] on November 25 and discharged home 7 days later on December 1 for acute pancreatitis.  They discharged him back on lisinopril 10 mg and upped his omeprazole to 40 mg. Hx of recurrent pancreatitis and PUD.  He says he feels like he got to the hospital little early this time and recovered more quickly he feels like his appetite is already back to normal.  They did not send him home on any additional antibiotics he did bring in his log of blood pressures since he has been home.  December 2 at 132/86, December 3 161/97 he checked it later and it was 148/90.  December 4 150/85, December 5 at 129/82, December 6 132/82.  Wants to be able to go back down on his omeprazole to 20 mg.  Recommendations to physicians/followup needed:  PCP f/u in 1 week. BP check GI f/u in 2-3 weeks. MRI for pancreatic tail cyst   He was in the hospital he was given trazodone to help him sleep.  He does feel like it was helpful and wants to consider maybe that is an option in the future he wants to give it a little bit longer to see if he can get back on track with his sleep.   Note from CT abdomen:  IMPRESSION:   1. Acute interstitial edematous pancreatitis involving the pancreatic head. No definite pancreatic necrosis identified. There is associated ill-defined intra-abdominal fluid without an organized peripancreatic collection.  2.  Ill-defined 2.0 cm region within the pancreatic tail, distant from the pancreatic head inflammation. While possibly a pancreatic pseudocyst, a pancreatic neoplasm cannot be excluded. Further evaluation with nonemergent outpatient pancreas MRI is advised.  3.  Surgical changes of Billroth II and hepaticojejunostomy.  4.  Trace pneumobilia, presumably  secondary to prior sphincterotomy and hepaticojejunostomy.  5.  Infrarenal 3.1 cm abdominal aortic aneurysm, similar to the 01/29/2021 CT abdomen and pelvis. Surveillance imaging in 3 years is advised according to current Society for Vascular Surgery and [employer] of Radiology recommendations.    Note from CT of the abdomen pelvis January 29, 2021 from [facility] it showed a low-density mass in the uncinate process of the pancreas measuring 2.5 x 3.1 cm    ROS    Objective:     BP 125/88   Pulse 96   Ht 6\' 2"  (1.88 m)   Wt 222 lb (100.7 kg)   SpO2 98%   BMI 28.50 kg/m    Physical Exam Vitals and nursing note reviewed.  Constitutional:      Appearance: Normal appearance.  HENT:     Head: Normocephalic and atraumatic.  Eyes:     Conjunctiva/sclera: Conjunctivae normal.  Cardiovascular:     Rate and Rhythm: Normal rate and regular rhythm.  Pulmonary:     Effort: Pulmonary effort is normal.     Breath sounds: Normal breath sounds.  Skin:    General: Skin is warm and dry.  Neurological:     Mental Status: He is alert.  Psychiatric:        Mood and Affect: Mood normal.      No results found for any visits on 05/03/23.    The ASCVD Risk score (Arnett DK, et al., 2019)  failed to calculate for the following reasons:   The patient has a prior MI or stroke diagnosis    Assessment & Plan:   Problem List Items Addressed This Visit       Cardiovascular and Mediastinum   Essential hypertension    Pressure looks great today on increased dose of lisinopril but he is getting some higher pressures like in the 130s and 150s at home continue to monitor for another week and send those numbers to me we can always make an adjustment if needed.      Aortic aneurysm, infrarenal (HCC)    BP at goal  Infrarenal 3.1 cm Abdominal Aortic Aneurysm (ICD10-I71.9). Done 03/2023 Recommend follow-up aortic ultrasound in 3 years.        Digestive   Pancreatic lesion     Looks like the lesion in the pancreas is pretty stable there was a CT back in 2022 showing it was 2.5 x 3.1 cm.  CT this past month measured the lesion around 3.1 cm so I think that is pretty stable.  Recommend that we keep a close eye on it may be repeat imaging in 1 year.      Chronic GERD    Doreatha Martin out this month of the omeprazole 40 mg and then go back down to 20 mg.      Other Visit Diagnoses     Idiopathic acute pancreatitis, unspecified complication status    -  Primary       No follow-ups on file.    Nani Gasser, MD

## 2023-05-04 ENCOUNTER — Ambulatory Visit: Payer: Medicare Other | Admitting: Sports Medicine

## 2023-05-12 ENCOUNTER — Encounter: Payer: Self-pay | Admitting: Family Medicine

## 2023-05-27 ENCOUNTER — Other Ambulatory Visit: Payer: Self-pay | Admitting: Family Medicine

## 2023-06-01 DIAGNOSIS — H5213 Myopia, bilateral: Secondary | ICD-10-CM | POA: Diagnosis not present

## 2023-07-28 DIAGNOSIS — H35033 Hypertensive retinopathy, bilateral: Secondary | ICD-10-CM | POA: Diagnosis not present

## 2023-07-28 DIAGNOSIS — H02831 Dermatochalasis of right upper eyelid: Secondary | ICD-10-CM | POA: Diagnosis not present

## 2023-07-28 DIAGNOSIS — H25813 Combined forms of age-related cataract, bilateral: Secondary | ICD-10-CM | POA: Diagnosis not present

## 2023-07-28 DIAGNOSIS — H5213 Myopia, bilateral: Secondary | ICD-10-CM | POA: Diagnosis not present

## 2023-07-28 DIAGNOSIS — H02834 Dermatochalasis of left upper eyelid: Secondary | ICD-10-CM | POA: Diagnosis not present

## 2023-08-03 DIAGNOSIS — J449 Chronic obstructive pulmonary disease, unspecified: Secondary | ICD-10-CM | POA: Diagnosis not present

## 2023-08-03 DIAGNOSIS — I10 Essential (primary) hypertension: Secondary | ICD-10-CM | POA: Diagnosis not present

## 2023-08-03 DIAGNOSIS — H25811 Combined forms of age-related cataract, right eye: Secondary | ICD-10-CM | POA: Diagnosis not present

## 2023-08-10 DIAGNOSIS — H25812 Combined forms of age-related cataract, left eye: Secondary | ICD-10-CM | POA: Diagnosis not present

## 2023-08-10 DIAGNOSIS — I10 Essential (primary) hypertension: Secondary | ICD-10-CM | POA: Diagnosis not present

## 2023-08-10 DIAGNOSIS — J449 Chronic obstructive pulmonary disease, unspecified: Secondary | ICD-10-CM | POA: Diagnosis not present

## 2023-08-13 ENCOUNTER — Encounter: Payer: Self-pay | Admitting: Family Medicine

## 2023-08-13 ENCOUNTER — Ambulatory Visit (INDEPENDENT_AMBULATORY_CARE_PROVIDER_SITE_OTHER): Admitting: Family Medicine

## 2023-08-13 VITALS — BP 124/81 | HR 84 | Ht 73.0 in | Wt 232.0 lb

## 2023-08-13 DIAGNOSIS — I10 Essential (primary) hypertension: Secondary | ICD-10-CM | POA: Diagnosis not present

## 2023-08-13 DIAGNOSIS — J410 Simple chronic bronchitis: Secondary | ICD-10-CM

## 2023-08-13 DIAGNOSIS — E1165 Type 2 diabetes mellitus with hyperglycemia: Secondary | ICD-10-CM

## 2023-08-13 DIAGNOSIS — Z125 Encounter for screening for malignant neoplasm of prostate: Secondary | ICD-10-CM | POA: Diagnosis not present

## 2023-08-13 DIAGNOSIS — N1831 Chronic kidney disease, stage 3a: Secondary | ICD-10-CM

## 2023-08-13 DIAGNOSIS — R7309 Other abnormal glucose: Secondary | ICD-10-CM

## 2023-08-13 DIAGNOSIS — K219 Gastro-esophageal reflux disease without esophagitis: Secondary | ICD-10-CM

## 2023-08-13 LAB — POCT GLYCOSYLATED HEMOGLOBIN (HGB A1C): Hemoglobin A1C: 7 % — AB (ref 4.0–5.6)

## 2023-08-13 MED ORDER — OMEPRAZOLE 20 MG PO CPDR: 20.0000 mg | DELAYED_RELEASE_CAPSULE | Freq: Every day | ORAL | 3 refills | Status: DC

## 2023-08-13 MED ORDER — OMEPRAZOLE 40 MG PO CPDR: 40.0000 mg | DELAYED_RELEASE_CAPSULE | Freq: Every day | ORAL | 3 refills | Status: DC

## 2023-08-13 NOTE — Assessment & Plan Note (Signed)
 He is doing well working to bump his omeprazole down to 20 mg.  We can always go back up if he is having breakthrough symptoms.

## 2023-08-13 NOTE — Assessment & Plan Note (Signed)
 Well controlled. Continue current regimen. Follow up in  6 mo

## 2023-08-13 NOTE — Assessment & Plan Note (Addendum)
 A1c today was 7.0.  We did discuss that that puts him into the diabetic range.  We discussed metformin as an option.  He really wants the opportunity to work on diet and exercise which I think is reasonable.  I recommend that he recheck his A1c in 90 days if he is not able to get it under 6.5 then we will need to start metformin.  He is already on lisinopril.  He does have a history of CKD 3 and he is already on Crestor 10 mg.  We discussed cutting back on starches, carbohydrates he says he really does not eat a lot of sugars he thinks that his drinks or diet will confirm that.  He says he has been very sedentary over the wintertime but normally walks a lot more and says he plans on doing that this spring and summer.

## 2023-08-13 NOTE — Assessment & Plan Note (Addendum)
 Denies any recent flares or exacerbations currently on Anoro.

## 2023-08-13 NOTE — Progress Notes (Signed)
 Established Patient Office Visit  Subjective  Patient ID: Christopher [name], male    DOB: December 29, 1950  Age: 73 y.o. MRN: [id number]  Chief Complaint  Patient presents with   Hypertension    HPI  Hypertension- Pt denies chest pain, SOB, dizziness, or heart palpitations.  Taking meds as directed w/o problems.  Denies medication side effects.    He just had right cataract surgery on March 11 and the left was done March 18.  He is doing well overall as has his follow-up on Monday.  He is going up [place] for the summer and so will not be back until October.    ROS    Objective:     BP 124/81   Pulse 84   Ht 6\' 1"  (1.854 m)   Wt 232 lb (105.2 kg)   SpO2 97%   BMI 30.61 kg/m    Physical Exam Vitals and nursing note reviewed.  Constitutional:      Appearance: Normal appearance.  HENT:     Head: Normocephalic and atraumatic.  Eyes:     Conjunctiva/sclera: Conjunctivae normal.  Cardiovascular:     Rate and Rhythm: Normal rate and regular rhythm.  Pulmonary:     Effort: Pulmonary effort is normal.     Breath sounds: Normal breath sounds.  Skin:    General: Skin is warm and dry.  Neurological:     Mental Status: He is alert.  Psychiatric:        Mood and Affect: Mood normal.      Results for orders placed or performed in visit on 08/13/23  POCT HgB A1C  Result Value Ref Range   Hemoglobin A1C 7.0 (A) 4.0 - 5.6 %   HbA1c POC (<> result, manual entry)     HbA1c, POC (prediabetic range)     HbA1c, POC (controlled diabetic range)        The ASCVD Risk score (Arnett DK, et al., 2019) failed to calculate for the following reasons:   Risk score cannot be calculated because patient has a medical history suggesting prior/existing ASCVD    Assessment & Plan:   Problem List Items Addressed This Visit       Cardiovascular and Mediastinum   Essential hypertension   Well controlled. Continue current regimen. Follow up in  36mo       Relevant Orders   CMP14+EGFR    Lipid panel   CBC     Respiratory   COPD (chronic obstructive pulmonary disease) (HCC)   Denies any recent flares or exacerbations currently on Anoro.        Digestive   Chronic GERD   He is doing well working to bump his omeprazole down to 20 mg.  We can always go back up if he is having breakthrough symptoms.      Relevant Medications   omeprazole (PRILOSEC) 20 MG capsule     Endocrine   Type 2 diabetes mellitus with hyperglycemia (HCC)   A1c today was 7.0.  We did discuss that that puts him into the diabetic range.  We discussed metformin as an option.  He really wants the opportunity to work on diet and exercise which I think is reasonable.  I recommend that he recheck his A1c in 90 days if he is not able to get it under 6.5 then we will need to start metformin.  He is already on lisinopril.  He does have a history of CKD 3 and he is already on Crestor 10 mg.  We discussed cutting back on starches, carbohydrates he says he really does not eat a lot of sugars he thinks that his drinks or diet will confirm that.  He says he has been very sedentary over the wintertime but normally walks a lot more and says he plans on doing that this spring and summer.      Relevant Orders   CMP14+EGFR   Lipid panel   CBC   Urine Microalbumin w/creat. ratio     Genitourinary   CKD (chronic kidney disease) stage 3, GFR 30-59 ml/min (HCC)   Relevant Orders   CMP14+EGFR   Lipid panel   CBC   Urine Microalbumin w/creat. ratio   Other Visit Diagnoses       Abnormal glucose    -  Primary   Relevant Orders   POCT HgB A1C (Completed)   CMP14+EGFR   Lipid panel   CBC   Urine Microalbumin w/creat. ratio     Screening for malignant neoplasm of prostate       Relevant Orders   PSA       Return in about 7 months (around 03/14/2024) for Diabetes follow-up, Hypertension.    Nani Gasser, MD

## 2023-08-14 LAB — CMP14+EGFR
ALT: 27 IU/L (ref 0–44)
AST: 24 IU/L (ref 0–40)
Albumin: 4.5 g/dL (ref 3.8–4.8)
Alkaline Phosphatase: 148 IU/L — ABNORMAL HIGH (ref 44–121)
BUN/Creatinine Ratio: 12 (ref 10–24)
BUN: 14 mg/dL (ref 8–27)
Bilirubin Total: 0.3 mg/dL (ref 0.0–1.2)
CO2: 21 mmol/L (ref 20–29)
Calcium: 9 mg/dL (ref 8.6–10.2)
Chloride: 102 mmol/L (ref 96–106)
Creatinine, Ser: 1.13 mg/dL (ref 0.76–1.27)
Globulin, Total: 3.1 g/dL (ref 1.5–4.5)
Glucose: 236 mg/dL — ABNORMAL HIGH (ref 70–99)
Potassium: 4.7 mmol/L (ref 3.5–5.2)
Sodium: 137 mmol/L (ref 134–144)
Total Protein: 7.6 g/dL (ref 6.0–8.5)
eGFR: 69 mL/min/{1.73_m2} (ref >59)

## 2023-08-14 LAB — LIPID PANEL
Chol/HDL Ratio: 5.6 ratio — ABNORMAL HIGH (ref 0.0–5.0)
Cholesterol, Total: 180 mg/dL (ref 100–199)
HDL: 32 mg/dL — ABNORMAL LOW (ref >39)
LDL Chol Calc (NIH): 75 mg/dL (ref 0–99)
Triglycerides: 460 mg/dL — ABNORMAL HIGH (ref 0–149)
VLDL Cholesterol Cal: 73 mg/dL — ABNORMAL HIGH (ref 5–40)

## 2023-08-14 LAB — CBC
Hematocrit: 40.6 % (ref 37.5–51.0)
Hemoglobin: 14 g/dL (ref 13.0–17.7)
MCH: 32.8 pg (ref 26.6–33.0)
MCHC: 34.5 g/dL (ref 31.5–35.7)
MCV: 95 fL (ref 79–97)
Platelets: 240 10*3/uL (ref 150–450)
RBC: 4.27 x10E6/uL (ref 4.14–5.80)
RDW: 11.9 % (ref 11.6–15.4)
WBC: 4.8 10*3/uL (ref 3.4–10.8)

## 2023-08-14 LAB — PSA: Prostate Specific Ag, Serum: 3 ng/mL (ref 0.0–4.0)

## 2023-08-16 ENCOUNTER — Other Ambulatory Visit: Payer: Self-pay | Admitting: *Deleted

## 2023-08-16 DIAGNOSIS — N1831 Chronic kidney disease, stage 3a: Secondary | ICD-10-CM | POA: Diagnosis not present

## 2023-08-16 DIAGNOSIS — E1165 Type 2 diabetes mellitus with hyperglycemia: Secondary | ICD-10-CM | POA: Diagnosis not present

## 2023-08-16 DIAGNOSIS — R7309 Other abnormal glucose: Secondary | ICD-10-CM

## 2023-08-18 ENCOUNTER — Encounter: Payer: Self-pay | Admitting: Family Medicine

## 2023-08-18 LAB — MICROALBUMIN / CREATININE URINE RATIO
Creatinine, Urine: 165.1 mg/dL
Microalb/Creat Ratio: 3 mg/g{creat} (ref 0–29)
Microalbumin, Urine: 4.7 ug/mL

## 2023-08-18 LAB — SPECIMEN STATUS REPORT

## 2023-08-18 NOTE — Progress Notes (Signed)
 Hi Christopher [name], your kidney function looks better.  It is back down to baseline of 1.1, much better than 4 months ago.  Glucose was extremely high though it was over 200.  Really make sure that you are focusing it on your diet and your choices.  Alkaline phosphatase is still little elevated similar to the last couple of times but your additional liver enzymes are normal.  LDL cholesterol is under 100 which is good triglycerides are up a little bit.  Probably related to the glucose also being higher.  Continue to work on healthy diet and regular exercise.  No anemia.  Your PSA which is the prostate test is normal but it is a little higher than it has been in the last several years so we will recheck it again in 6 months instead of waiting a year.

## 2023-08-27 ENCOUNTER — Other Ambulatory Visit: Payer: Self-pay | Admitting: Family Medicine

## 2023-08-27 DIAGNOSIS — F325 Major depressive disorder, single episode, in full remission: Secondary | ICD-10-CM

## 2023-09-07 ENCOUNTER — Ambulatory Visit: Payer: Medicare Other | Admitting: Family Medicine

## 2023-10-07 ENCOUNTER — Ambulatory Visit

## 2023-10-07 VITALS — Ht 74.0 in | Wt 230.0 lb

## 2023-10-07 DIAGNOSIS — Z0000 Encounter for general adult medical examination without abnormal findings: Secondary | ICD-10-CM

## 2023-10-07 NOTE — Progress Notes (Signed)
 Subjective:   Christopher [name] is a 73 y.o. male who presents for Medicare Annual/Subsequent preventive examination.  Visit Complete: Virtual I connected with  [name] on 10/07/23 by a audio enabled telemedicine application and verified that I am speaking with the correct person using two identifiers.  Patient Location: Home  Provider Location: Office/Clinic  I discussed the limitations of evaluation and management by telemedicine. The patient expressed understanding and agreed to proceed.  Vital Signs: Because this visit was a virtual/telehealth visit, some criteria may be missing or patient reported. Any vitals not documented were not able to be obtained and vitals that have been documented are patient reported.  Patient Medicare AWV questionnaire was completed by the patient on n/a; I have confirmed that all information answered by patient is correct and no changes since this date.  Cardiac Risk Factors include: advanced age (>77men, >65 women);smoking/ tobacco exposure;male gender;hypertension;dyslipidemia;family history of premature cardiovascular disease     Objective:    Today's Vitals   10/07/23 1303  Weight: 230 lb (104.3 kg)  Height: 6\' 2"  (1.88 m)   Body mass index is 29.53 kg/m.     10/07/2023    1:15 PM 09/29/2022    1:11 PM 09/24/2021    1:06 PM 08/28/2020    9:11 AM 09/26/2018    9:09 AM  Advanced Directives  Does Patient Have a Medical Advance Directive? Yes Yes Yes Yes Yes  Type of Advance Directive Healthcare Power of Attorney Living will Living will;Healthcare Power of [employer] Power of [place];Living will Healthcare Power of [place];Living will  Does patient want to make changes to medical advance directive?  No - Patient declined No - Patient declined No - Patient declined No - Patient declined  Copy of Healthcare Power of Attorney in Chart? No - copy requested  No - copy requested No - copy requested No - copy requested    Current Medications  (verified) Outpatient Encounter Medications as of 10/07/2023  Medication Sig   albuterol  (PROAIR  HFA) 108 (90 Base) MCG/ACT inhaler Inhale 2 puffs into the lungs every 6 (six) hours as needed for wheezing.   citalopram  (CELEXA ) 20 MG tablet Take 1 tablet by mouth once daily   lisinopril  (ZESTRIL ) 10 MG tablet Take 1 tablet by mouth once daily   Multiple Vitamin (MULTIVITAMIN) tablet Take 1 tablet by mouth daily.   rosuvastatin  (CRESTOR ) 10 MG tablet Take 1 tablet (10 mg total) by mouth daily.   umeclidinium-vilanterol (ANORO ELLIPTA ) 62.5-25 MCG/ACT AEPB Inhale 1 puff into the lungs daily.   omeprazole  (PRILOSEC) 20 MG capsule Take 1 capsule (20 mg total) by mouth daily.   No facility-administered encounter medications on file as of 10/07/2023.    Allergies (verified) Lorazepam, Nsaids, Onion, Pravastatin , and Simvastatin    History: Past Medical History:  Diagnosis Date   Anxiety    Blood transfusion without reported diagnosis    Clotting disorder (HCC) 2011   DVT s/p surgery   COPD (chronic obstructive pulmonary disease) (HCC)    Depression    Gastric ulcer    GERD (gastroesophageal reflux disease)    Hyperlipidemia    Hypertension    Pancreatitis    11/2010. 2011   Stroke [facility])    Past Surgical History:  Procedure Laterality Date   abdominal reconstructin     X 3   BILE DUCT EXPLORATION     CHOLECYSTECTOMY     Duodenum Stricture     Explorratory Laparotomy     GASTRECTOMY  GASTROJEJUNOSTOMY     LAPAROSCOPIC LYSIS OF ADHESIONS     Pancreatic leak repair     Resection of distal stomach and proximal Roux limb     ROUX-EN-Y GASTRIC BYPASS     Family History  Problem Relation Age of Onset   Hypertension Mother    Heart attack Father    Prostate cancer Father        prostate, lip cancer   Uterine cancer Sister    Colon cancer Sister 21   Colon cancer Sister 34   Ovarian cancer Sister    Lung cancer Brother    Heart attack Daughter    Esophageal cancer Neg Hx     Stomach cancer Neg Hx    Rectal cancer Neg Hx    Inflammatory bowel disease Neg Hx    Liver disease Neg Hx    Pancreatic cancer Neg Hx    Social History   Socioeconomic History   Marital status: Divorced    Spouse name: Not on file   Number of children: 4   Years of education: 12   Highest education level: 12th grade  Occupational History   Occupation: Music therapist work    Comment: retired  Tobacco Use   Smoking status: Former    Current packs/day: 0.00    Average packs/day: 1 pack/day for 32.2 years (32.2 ttl pk-yrs)    Types: Cigarettes    Start date: 09/23/1966    Quit date: 12/05/1998    Years since quitting: 24.8   Smokeless tobacco: Former    Types: Chew   Tobacco comments:    Quit 15 years ago  Vaping Use   Vaping status: Never Used  Substance and Sexual Activity   Alcohol use: Not Currently   Drug use: No   Sexual activity: Not Currently  Other Topics Concern   Not on file  Social History Narrative   Lives with daughter and son in Social [place]. 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 Visits [place]  for 5-6 months a year.    Social Drivers of Corporate investment banker Strain: Low Risk  (08/09/2023)   Overall Financial Resource Strain (CARDIA)    Difficulty of Paying Living Expenses: Not very hard  Food Insecurity: No Food Insecurity (10/07/2023)   Hunger Vital Sign    Worried About Running Out of Food in the Last Year: Never true    Ran Out of Food in the Last Year: Never true  Transportation Needs: No Transportation Needs (08/09/2023)   PRAPARE - Administrator, Civil Service (Medical): No    Lack of Transportation (Non-Medical): No  Physical Activity: Insufficiently Active (10/07/2023)   Exercise Vital Sign    Days of Exercise per Week: 4 days    Minutes of Exercise per Session: 30 min  Stress: No Stress Concern Present (10/07/2023)   [employer] of Occupational Health - Occupational Stress Questionnaire    Feeling of Stress : Not at all  Social  Connections: Socially Isolated (10/07/2023)   Social Connection and Isolation Panel [NHANES]    Frequency of Communication with Friends and Family: More than three times a week    Frequency of Social Gatherings with Friends and Family: Once a week    Attends Religious Services: Never    Database administrator or Organizations: No    Attends Banker Meetings: Never    Marital Status: Divorced    Tobacco Counseling Counseling given: Not Answered Tobacco comments: Quit 15 years ago  Clinical Intake:  Pre-visit preparation completed: Yes  Pain : No/denies pain     BMI - recorded: 29.53 Nutritional Status: BMI 25 -29 Overweight Nutritional Risks: Other (Comment) Diabetes: No  How often do you need to have someone help you when you read instructions, pamphlets, or other written materials from your doctor or pharmacy?: 1 - Never What is the last grade level you completed in school?: 12  Interpreter Needed?: No      Activities of Daily Living    10/07/2023    1:06 PM  In your present state of health, do you have any difficulty performing the following activities:  Hearing? 0  Vision? 0  Difficulty concentrating or making decisions? 0  Walking or climbing stairs? 0  Dressing or bathing? 0  Doing errands, shopping? 0  Preparing Food and eating ? N  Using the Toilet? N  In the past six months, have you accidently leaked urine? N  Do you have problems with loss of bowel control? N  Managing your Medications? N  Managing your Finances? N  Housekeeping or managing your Housekeeping? N    Patient Care Team: Cydney Draft, MD as PCP - General Mansouraty, Albino Alu., MD as Consulting Physician (Gastroenterology)  Indicate any recent Medical Services you may have received from other than [facility] providers in the past year (date may be approximate).     Assessment:   This is a routine wellness examination for Christopher [name].  Hearing/Vision screen No results  found.   Goals Addressed             This Visit's Progress    Patient Stated       Patient stated he would like to eat healthy and lose weight.       Depression Screen    10/07/2023    1:14 PM 08/13/2023    3:24 PM 03/09/2023    9:30 AM 09/29/2022    1:11 PM 03/27/2022    2:13 PM 09/24/2021    1:07 PM 07/15/2021    9:51 AM  PHQ 2/9 Scores  PHQ - 2 Score 0 0 0 0 0 0 0  PHQ- 9 Score  2 2  2       Fall Risk    10/07/2023    1:16 PM 08/13/2023    3:24 PM 03/09/2023    9:30 AM 09/29/2022    1:10 PM 09/28/2022    8:51 PM  Fall Risk   Falls in the past year? 0 0 0 0 0  Number falls in past yr: 0 0 0 0 0  Injury with Fall? 0 0 0 0 0  Risk for fall due to : No Fall Risks No Fall Risks No Fall Risks No Fall Risks   Follow up Falls evaluation completed Falls evaluation completed Falls evaluation completed Falls evaluation completed     MEDICARE RISK AT HOME: Medicare Risk at Home Any stairs in or around the home?: Yes If so, are there any without handrails?: Yes Home free of loose throw rugs in walkways, pet beds, electrical cords, etc?: Yes Adequate lighting in your home to reduce risk of falls?: Yes Life alert?: No Use of a cane, walker or w/c?: No Grab bars in the bathroom?: Yes Shower chair or bench in shower?: No Elevated toilet seat or a handicapped toilet?: No  TIMED UP AND GO:  Was the test performed?  No    Cognitive Function:        10/07/2023    1:17  PM 09/29/2022    1:11 PM 09/24/2021    1:11 PM 08/28/2020    9:19 AM 09/26/2018    9:14 AM  6CIT Screen  What Year? 0 points 0 points 0 points 0 points 0 points  What month? 0 points 0 points 0 points 0 points 0 points  What time? 0 points 0 points 0 points 0 points 0 points  Count back from 20 0 points 0 points 0 points 0 points 0 points  Months in reverse 0 points 0 points 0 points 0 points 0 points  Repeat phrase 0 points 2 points 2 points 0 points 0 points  Total Score 0 points 2 points 2 points 0 points 0 points     Immunizations Immunization History  Administered Date(s) Administered   Fluad Quad(high Dose 65+) 03/08/2019, 03/11/2020, 03/14/2021, 03/27/2022   Fluad Trivalent(High Dose 65+) 03/09/2023   Influenza Split 02/23/2012   Influenza Whole 03/05/2010, 01/16/2011   Influenza, High Dose Seasonal PF 03/17/2016, 03/02/2017, 03/22/2018   Influenza,inj,Quad PF,6+ Mos 01/12/2014, 02/04/2015   Influenza-Unspecified 04/10/2013   Moderna SARS-COV2 Booster Vaccination 03/20/2020   Moderna Sars-Covid-2 Vaccination 06/20/2019, 07/18/2019   Pfizer Covid-19 Vaccine Bivalent Booster 34yrs & up 03/17/2021   Pfizer(Comirnaty)Fall Seasonal Vaccine 12 years and older 03/27/2022, 03/09/2023   Pneumococcal Conjugate-13 06/13/2015   Pneumococcal Polysaccharide-23 04/01/2010, 09/01/2016   Tdap 01/16/2011, 09/12/2020   Zoster Recombinant(Shingrix) 09/12/2020, 03/04/2021   Zoster, Live 01/01/2011    TDAP status: Up to date  Flu Vaccine status: Up to date  Pneumococcal vaccine status: Up to date  Covid-19 vaccine status: Information provided on how to obtain vaccines.   Qualifies for Shingles Vaccine? Yes   Zostavax completed Yes   Shingrix Completed?: Yes  Screening Tests Health Maintenance  Topic Date Due   FOOT EXAM  Never done   OPHTHALMOLOGY EXAM  Never done   Colonoscopy  04/03/2022   Lung Cancer Screening  07/26/2022   COVID-19 Vaccine (7 - 2024-25 season) 09/07/2023   INFLUENZA VACCINE  12/24/2023   HEMOGLOBIN A1C  02/13/2024   Diabetic kidney evaluation - eGFR measurement  08/12/2024   Diabetic kidney evaluation - Urine ACR  08/15/2024   Medicare Annual Wellness (AWV)  10/06/2024   DTaP/Tdap/Td (3 - Td or Tdap) 09/13/2030   Pneumonia Vaccine 56+ Years old  Completed   Hepatitis C Screening  Completed   Zoster Vaccines- Shingrix  Completed   HPV VACCINES  Aged Out   Meningococcal B Vaccine  Aged Out    Health Maintenance  Health Maintenance Due  Topic Date Due   FOOT EXAM   Never done   OPHTHALMOLOGY EXAM  Never done   Colonoscopy  04/03/2022   Lung Cancer Screening  07/26/2022   COVID-19 Vaccine (7 - 2024-25 season) 09/07/2023    Colorectal cancer screening: Type of screening: Colonoscopy. Completed 04/04/2019. Repeat every 3 years He is going to schedule this coming year.   Lung Cancer Screening: (Low Dose CT Chest recommended if Age 62-80 years, 20 pack-year currently smoking OR have quit w/in 15years.) does not qualify.   Lung Cancer Screening Referral: n/a  Additional Screening:  Hepatitis C Screening: does qualify; Completed 06/20/2015  Vision Screening: Recommended annual ophthalmology exams for early detection of glaucoma and other disorders of the eye. Is the patient up to date with their annual eye exam?  Yes  Who is the provider or what is the name of the office in which the patient attends annual eye exams? [facility] If pt is not  established with a provider, would they like to be referred to a provider to establish care? N/a.   Dental Screening: Recommended annual dental exams for proper oral hygiene  Diabetic Foot Exam: Diabetic Foot Exam: Overdue, Pt has been advised about the importance in completing this exam. Pt is scheduled for diabetic foot exam on 03/14/2024.  Community Resource Referral / Chronic Care Management: CRR required this visit?  No   CCM required this visit?  No     Plan:     I have personally reviewed and noted the following in the patient's chart:   Medical and social history Use of alcohol, tobacco or illicit drugs  Current medications and supplements including opioid prescriptions. Patient is not currently taking opioid prescriptions. Functional ability and status Nutritional status Physical activity Advanced directives List of other physicians Hospitalizations # 1, surgeries #0, and ER # 0 visits in previous 12 months Vitals Screenings to include cognitive, depression, and falls Referrals and  appointments  In addition, I have reviewed and discussed with patient certain preventive protocols, quality metrics, and best practice recommendations. A written personalized care plan for preventive services as well as general preventive health recommendations were provided to patient.     Aubrey Leaf, CMA   10/07/2023   After Visit Summary: (MyChart) Due to this being a telephonic visit, the after visit summary with patients personalized plan was offered to patient via MyChart   Nurse Notes:     Skylar Sien is a 73 y.o. male patient of [name], Corita Diego, MD who had a Medicare Annual Wellness Visit today via telephone. Cordarrell is Retired and lives with their daughter. He has 4 children. he reports that he is socially active and does interact with friends/family regularly. He is moderately physically active and enjoys wood working and collecting rocks.

## 2023-10-07 NOTE — Patient Instructions (Signed)
 Christopher [name] , Thank you for taking time to come for your Medicare Wellness Visit. I appreciate your ongoing commitment to your health goals. Please review the following plan we discussed and let me know if I can assist you in the future.   These are the goals we discussed:  Goals       Medication Management      Patient Goals/Self-Care Activities Over the next 180 days, patient will:  take medications as prescribed and collaborate with provider on medication access solutions  Follow Up Plan: Telephone follow up appointment with care management team member scheduled for:  6 months        Patient Stated      Patient states would like to eat a healthier diet and smaller portions.      Patient Stated (pt-stated)      08/28/2020 AWV Goal: Exercise for General Health  Patient will verbalize understanding of the benefits of increased physical activity: Exercising regularly is important. It will improve your overall fitness, flexibility, and endurance. Regular exercise also will improve your overall health. It can help you control your weight, reduce stress, and improve your bone density. Over the next year, patient will increase physical activity as tolerated with a goal of at least 150 minutes of moderate physical activity per week.  You can tell that you are exercising at a moderate intensity if your heart starts beating faster and you start breathing faster but can still hold a conversation. Moderate-intensity exercise ideas include: Walking 1 mile (1.6 km) in about 15 minutes Biking Hiking Golfing Dancing Water aerobics Patient will verbalize understanding of everyday activities that increase physical activity by providing examples like the following: Yard work, such as: Insurance underwriter Gardening Washing windows or floors Patient will be able to explain general safety guidelines for exercising:   Before you start a new exercise program, talk with your health care provider. Do not exercise so much that you hurt yourself, feel dizzy, or get very short of breath. Wear comfortable clothes and wear shoes with good support. Drink plenty of water while you exercise to prevent dehydration or heat stroke. Work out until your breathing and your heartbeat get faster.       Patient Stated (pt-stated)      Would like to continue to be active and walking everyday.      Patient Stated (pt-stated)      Patient stated that he would like to work on his weight.      Patient Stated      Patient stated he would like to eat healthy and lose weight.         This is a list of the screening recommended for you and due dates:  Health Maintenance  Topic Date Due   Complete foot exam   Never done   Eye exam for diabetics  Never done   Colon Cancer Screening  04/03/2022   Screening for Lung Cancer  07/26/2022   COVID-19 Vaccine (7 - 2024-25 season) 09/07/2023   Flu Shot  12/24/2023   Hemoglobin A1C  02/13/2024   Yearly kidney function blood test for diabetes  08/12/2024   Yearly kidney health urinalysis for diabetes  08/15/2024   Medicare Annual Wellness Visit  10/06/2024   DTaP/Tdap/Td vaccine (3 - Td or Tdap) 09/13/2030   Pneumonia Vaccine  Completed   Hepatitis C Screening  Completed   Zoster (Shingles) Vaccine  Completed   HPV Vaccine  Aged Out   Meningitis B Vaccine  Aged Out

## 2023-11-23 ENCOUNTER — Telehealth: Payer: Self-pay

## 2023-11-23 DIAGNOSIS — E1165 Type 2 diabetes mellitus with hyperglycemia: Secondary | ICD-10-CM

## 2023-11-23 NOTE — Telephone Encounter (Signed)
 Is it ok to order

## 2023-11-23 NOTE — Telephone Encounter (Signed)
 Copied from CRM [id number]. Topic: Clinical - Request for Lab/Test Order >> Nov 22, 2023  4:56 PM Graeme ORN wrote: Reason for CRM: Patient called [place]. Provider wanted him to have labs done while away to check A1C. Lab needs order. [place], [facility] Fax: [phone].

## 2023-11-23 NOTE — Telephone Encounter (Signed)
Yes, okay to order

## 2023-11-24 NOTE — Telephone Encounter (Signed)
 Left message advising the orders have been faxed.

## 2023-11-29 NOTE — Telephone Encounter (Signed)
 Faxed order again. All information was sent.

## 2023-11-29 NOTE — Telephone Encounter (Signed)
 Copied from CRM [id number]. Topic: Clinical - Request for Lab/Test Order >> Nov 29, 2023  1:59 PM Merlynn A wrote: Reason for CRM: [facility] reached out regarding lab order received for patient. Received ALL pages except page to perform lab test and what test to perform. Please refax ASAP to [phone].

## 2023-11-30 NOTE — Telephone Encounter (Signed)
 Faxed lab orders again with all information. Received conformation.

## 2023-11-30 NOTE — Telephone Encounter (Signed)
 Copied from CRM [id number]. Topic: Clinical - Request for Lab/Test Order >> Nov 30, 2023  9:29 AM Cherylann RAMAN wrote: Reason for CRM: Patient reached out to [facility] regarding lab order. Patient states they have not received the order for the A1C test provider has ordered. Please refax ASAP to Main Fax: [phone], Additional Fax: Fax:[phone] per their website.

## 2023-12-07 DIAGNOSIS — E1165 Type 2 diabetes mellitus with hyperglycemia: Secondary | ICD-10-CM | POA: Diagnosis not present

## 2023-12-07 LAB — HEMOGLOBIN A1C: A1c: 5.6

## 2023-12-13 ENCOUNTER — Other Ambulatory Visit: Payer: Self-pay | Admitting: Family Medicine

## 2023-12-13 DIAGNOSIS — J410 Simple chronic bronchitis: Secondary | ICD-10-CM

## 2023-12-13 DIAGNOSIS — D649 Anemia, unspecified: Secondary | ICD-10-CM

## 2023-12-13 DIAGNOSIS — N1831 Chronic kidney disease, stage 3a: Secondary | ICD-10-CM

## 2023-12-13 DIAGNOSIS — F325 Major depressive disorder, single episode, in full remission: Secondary | ICD-10-CM

## 2023-12-13 DIAGNOSIS — I10 Essential (primary) hypertension: Secondary | ICD-10-CM

## 2023-12-13 DIAGNOSIS — K219 Gastro-esophageal reflux disease without esophagitis: Secondary | ICD-10-CM

## 2023-12-13 DIAGNOSIS — E7800 Pure hypercholesterolemia, unspecified: Secondary | ICD-10-CM

## 2023-12-13 NOTE — Telephone Encounter (Unsigned)
 Copied from CRM [id number]. Topic: Clinical - Medication Refill >> Dec 13, 2023 12:32 PM Carmell R wrote: Medication:  umeclidinium-vilanterol (ANORO ELLIPTA ) 62.5-25 MCG/ACT AEPB citalopram  (CELEXA ) 20 MG tablet omeprazole  (PRILOSEC) 20 MG capsule rosuvastatin  (CRESTOR ) 10 MG tablet lisinopril  (ZESTRIL ) 10 MG tablet  **Patient asking for a 90 day supply for all his medications. He is away for the summer.  Has the patient contacted their pharmacy? Yes. Request was sent  This is the patient's preferred pharmacy:  [facility] [address] [place] - [address] [phone] [place] [address] [place] [address] Phone: [phone] Fax: [phone]  Is this the correct pharmacy for this prescription? Yes  Has the prescription been filled recently? Yes  Is the patient out of the medication? No. Patient is out of town and wont be out for about a week.  Has the patient been seen for an appointment in the last year OR does the patient have an upcoming appointment? Yes  Can we respond through MyChart? Yes  Agent: Please be advised that Rx refills may take up to 3 business days. We ask that you follow-up with your pharmacy.

## 2023-12-14 MED ORDER — CITALOPRAM HYDROBROMIDE 20 MG PO TABS: 20.0000 mg | ORAL_TABLET | Freq: Every day | ORAL | 0 refills | Status: DC

## 2023-12-14 MED ORDER — UMECLIDINIUM-VILANTEROL 62.5-25 MCG/ACT IN AEPB
1.0000 | INHALATION_SPRAY | Freq: Every day | RESPIRATORY_TRACT | 3 refills | Status: DC
Start: 2023-12-14 — End: 2024-03-14

## 2023-12-14 MED ORDER — ROSUVASTATIN CALCIUM 10 MG PO TABS: 10.0000 mg | ORAL_TABLET | Freq: Every day | ORAL | 3 refills | Status: DC

## 2023-12-14 MED ORDER — LISINOPRIL 10 MG PO TABS: 10.0000 mg | ORAL_TABLET | Freq: Every day | ORAL | 0 refills | Status: DC

## 2023-12-14 MED ORDER — OMEPRAZOLE 20 MG PO CPDR: 20.0000 mg | DELAYED_RELEASE_CAPSULE | Freq: Every day | ORAL | 3 refills | Status: DC

## 2024-01-25 ENCOUNTER — Encounter: Payer: Self-pay | Admitting: Sports Medicine

## 2024-03-08 ENCOUNTER — Ambulatory Visit: Payer: Medicare Other | Admitting: Family Medicine

## 2024-03-14 ENCOUNTER — Ambulatory Visit: Admitting: Family Medicine

## 2024-03-14 ENCOUNTER — Encounter: Payer: Self-pay | Admitting: Family Medicine

## 2024-03-14 ENCOUNTER — Ambulatory Visit (INDEPENDENT_AMBULATORY_CARE_PROVIDER_SITE_OTHER): Admitting: Family Medicine

## 2024-03-14 VITALS — BP 136/82 | HR 75 | Ht 73.0 in | Wt 217.1 lb

## 2024-03-14 DIAGNOSIS — N1831 Chronic kidney disease, stage 3a: Secondary | ICD-10-CM | POA: Diagnosis not present

## 2024-03-14 DIAGNOSIS — Z0000 Encounter for general adult medical examination without abnormal findings: Secondary | ICD-10-CM

## 2024-03-14 DIAGNOSIS — R972 Elevated prostate specific antigen [PSA]: Secondary | ICD-10-CM

## 2024-03-14 DIAGNOSIS — I10 Essential (primary) hypertension: Secondary | ICD-10-CM

## 2024-03-14 DIAGNOSIS — E1165 Type 2 diabetes mellitus with hyperglycemia: Secondary | ICD-10-CM

## 2024-03-14 DIAGNOSIS — Z1211 Encounter for screening for malignant neoplasm of colon: Secondary | ICD-10-CM

## 2024-03-14 DIAGNOSIS — Z23 Encounter for immunization: Secondary | ICD-10-CM

## 2024-03-14 DIAGNOSIS — Z87891 Personal history of nicotine dependence: Secondary | ICD-10-CM

## 2024-03-14 DIAGNOSIS — E7800 Pure hypercholesterolemia, unspecified: Secondary | ICD-10-CM

## 2024-03-14 DIAGNOSIS — J410 Simple chronic bronchitis: Secondary | ICD-10-CM

## 2024-03-14 DIAGNOSIS — D649 Anemia, unspecified: Secondary | ICD-10-CM

## 2024-03-14 DIAGNOSIS — K219 Gastro-esophageal reflux disease without esophagitis: Secondary | ICD-10-CM

## 2024-03-14 DIAGNOSIS — F325 Major depressive disorder, single episode, in full remission: Secondary | ICD-10-CM | POA: Diagnosis not present

## 2024-03-14 MED ORDER — CITALOPRAM HYDROBROMIDE 20 MG PO TABS: 20.0000 mg | ORAL_TABLET | Freq: Every day | ORAL | 0 refills | Status: DC

## 2024-03-14 MED ORDER — OMEPRAZOLE 20 MG PO CPDR: 20.0000 mg | DELAYED_RELEASE_CAPSULE | Freq: Every day | ORAL | 3 refills | Status: AC

## 2024-03-14 MED ORDER — ALBUTEROL SULFATE HFA 108 (90 BASE) MCG/ACT IN AERS: 2.0000 | INHALATION_SPRAY | Freq: Four times a day (QID) | RESPIRATORY_TRACT | 1 refills | Status: AC | PRN

## 2024-03-14 MED ORDER — UMECLIDINIUM-VILANTEROL 62.5-25 MCG/ACT IN AEPB: 1.0000 | INHALATION_SPRAY | Freq: Every day | RESPIRATORY_TRACT | 3 refills | Status: DC

## 2024-03-14 MED ORDER — ROSUVASTATIN CALCIUM 10 MG PO TABS: 10.0000 mg | ORAL_TABLET | Freq: Every day | ORAL | 3 refills | Status: AC

## 2024-03-14 MED ORDER — LISINOPRIL 10 MG PO TABS: 10.0000 mg | ORAL_TABLET | Freq: Every day | ORAL | 0 refills | Status: DC

## 2024-03-14 NOTE — Progress Notes (Signed)
 Complete physical exam  Patient: Christopher [name]   DOB: 1950/09/21   73 y.o. Male  MRN: [id number]  Subjective:    Chief Complaint  Patient presents with   Annual Exam    Pt     Christopher [name] is a 73 y.o. male who presents today for a complete physical exam. He reports consuming a general diet. Stays active.  He generally feels fairly well. He reports sleeping well. He does not have additional problems to discuss today.  He does need some medications refilled today.  We also did discuss that he is a candidate for lung cancer screening and he is interested.  He is overdue for colon cancer screening and is okay with moving forward with getting that done this year.  He is otherwise doing well on his current blood pressure medication regimen.   Most recent fall risk assessment:    03/14/2024    9:39 AM  Fall Risk   Falls in the past year? 0  Number falls in past yr: 0  Injury with Fall? 0  Follow up Falls evaluation completed     Most recent depression screenings:    03/14/2024    9:40 AM 10/07/2023    1:14 PM  PHQ 2/9 Scores  PHQ - 2 Score 0 0  PHQ- 9 Score 1          Patient Care Team: Alvan Dorothyann BIRCH, MD as PCP - General Mansouraty, Aloha Raddle., MD as Consulting Physician (Gastroenterology)   Outpatient Medications Prior to Visit  Medication Sig   Multiple Vitamin (MULTIVITAMIN) tablet Take 1 tablet by mouth daily.   [DISCONTINUED] albuterol  (PROAIR  HFA) 108 (90 Base) MCG/ACT inhaler Inhale 2 puffs into the lungs every 6 (six) hours as needed for wheezing.   [DISCONTINUED] citalopram  (CELEXA ) 20 MG tablet Take 1 tablet (20 mg total) by mouth daily.   [DISCONTINUED] lisinopril  (ZESTRIL ) 10 MG tablet Take 1 tablet (10 mg total) by mouth daily.   [DISCONTINUED] omeprazole  (PRILOSEC) 20 MG capsule Take 1 capsule (20 mg total) by mouth daily.   [DISCONTINUED] rosuvastatin  (CRESTOR ) 10 MG tablet Take 1 tablet (10 mg total) by mouth daily.   [DISCONTINUED]  umeclidinium-vilanterol (ANORO ELLIPTA ) 62.5-25 MCG/ACT AEPB Inhale 1 puff into the lungs daily.   No facility-administered medications prior to visit.    ROS        Objective:     BP 136/82   Pulse 75   Ht 6' 1 (1.854 m)   Wt 217 lb 1.9 oz (98.5 kg)   SpO2 99%   BMI 28.65 kg/m     Physical Exam Constitutional:      Appearance: Normal appearance.  HENT:     Head: Normocephalic and atraumatic.     Right Ear: Tympanic membrane, ear canal and external ear normal.     Left Ear: Tympanic membrane, ear canal and external ear normal.     Nose: Nose normal.     Mouth/Throat:     Pharynx: Oropharynx is clear.  Eyes:     Extraocular Movements: Extraocular movements intact.     Conjunctiva/sclera: Conjunctivae normal.     Pupils: Pupils are equal, round, and reactive to light.  Neck:     Thyroid : No thyromegaly.  Cardiovascular:     Rate and Rhythm: Normal rate and regular rhythm.  Pulmonary:     Effort: Pulmonary effort is normal.     Breath sounds: Normal breath sounds.  Abdominal:     General: Bowel sounds are  normal.     Palpations: Abdomen is soft.     Tenderness: There is no abdominal tenderness.  Musculoskeletal:        General: No swelling.     Cervical back: Neck supple.  Skin:    General: Skin is warm and dry.  Neurological:     Mental Status: He is oriented to person, place, and time.  Psychiatric:        Mood and Affect: Mood normal.        Behavior: Behavior normal.      No results found for any visits on 03/14/24.      Assessment & Plan:    Routine Health Maintenance and Physical Exam  Immunization History  Administered Date(s) Administered   Fluad Quad(high Dose 65+) 03/08/2019, 03/11/2020, 03/14/2021, 03/27/2022   Fluad Trivalent(High Dose 65+) 03/09/2023   INFLUENZA, HIGH DOSE SEASONAL PF 03/17/2016, 03/02/2017, 03/22/2018, 03/14/2024   Influenza Split 02/23/2012   Influenza Whole 03/05/2010, 01/16/2011   Influenza,inj,Quad PF,6+ Mos  01/12/2014, 02/04/2015   Influenza-Unspecified 04/10/2013   Moderna SARS-COV2 Booster Vaccination 03/20/2020   Moderna Sars-Covid-2 Vaccination 06/20/2019, 07/18/2019   Pfizer Covid-19 Vaccine Bivalent Booster 86yrs & up 03/17/2021   Pfizer(Comirnaty)Fall Seasonal Vaccine 12 years and older 03/27/2022, 03/09/2023   Pneumococcal Conjugate-13 06/13/2015   Pneumococcal Polysaccharide-23 04/01/2010, 09/01/2016   Tdap 01/16/2011, 09/12/2020   Zoster Recombinant(Shingrix) 09/12/2020, 03/04/2021   Zoster, Live 01/01/2011    Health Maintenance  Topic Date Due   Colonoscopy  04/03/2022   Lung Cancer Screening  07/26/2022   COVID-19 Vaccine (7 - 2025-26 season) 01/24/2024   HEMOGLOBIN A1C  06/08/2024   OPHTHALMOLOGY EXAM  07/27/2024   Diabetic kidney evaluation - eGFR measurement  08/12/2024   Diabetic kidney evaluation - Urine ACR  08/15/2024   Medicare Annual Wellness (AWV)  10/06/2024   FOOT EXAM  03/14/2025   DTaP/Tdap/Td (3 - Td or Tdap) 09/13/2030   Pneumococcal Vaccine: 50+ Years  Completed   Influenza Vaccine  Completed   Hepatitis C Screening  Completed   Zoster Vaccines- Shingrix  Completed   Meningococcal B Vaccine  Aged Out    Discussed health benefits of physical activity, and encouraged him to engage in regular exercise appropriate for his age and condition.  Problem List Items Addressed This Visit       Cardiovascular and Mediastinum   Essential hypertension   Relevant Medications   lisinopril  (ZESTRIL ) 10 MG tablet   rosuvastatin  (CRESTOR ) 10 MG tablet   Other Relevant Orders   CMP14+EGFR   PSA   HgB A1c     Respiratory   COPD (chronic obstructive pulmonary disease) (HCC)   Relevant Medications   albuterol  (PROAIR  HFA) 108 (90 Base) MCG/ACT inhaler   umeclidinium-vilanterol (ANORO ELLIPTA ) 62.5-25 MCG/ACT AEPB     Digestive   Chronic GERD   Relevant Medications   omeprazole  (PRILOSEC) 20 MG capsule     Endocrine   Type 2 diabetes mellitus with  hyperglycemia (HCC)   Relevant Medications   lisinopril  (ZESTRIL ) 10 MG tablet   rosuvastatin  (CRESTOR ) 10 MG tablet   Other Relevant Orders   CMP14+EGFR   PSA   HgB A1c     Genitourinary   CKD (chronic kidney disease) stage 3, GFR 30-59 ml/min (HCC)   Relevant Medications   rosuvastatin  (CRESTOR ) 10 MG tablet   Other Relevant Orders   CMP14+EGFR   PSA   HgB A1c     Other   Hyperlipidemia   Relevant Medications   lisinopril  (ZESTRIL )  10 MG tablet   rosuvastatin  (CRESTOR ) 10 MG tablet   Other Relevant Orders   CMP14+EGFR   PSA   HgB A1c   Depression   Relevant Medications   citalopram  (CELEXA ) 20 MG tablet   Other Visit Diagnoses       Elevated PSA    -  Primary   Relevant Orders   CMP14+EGFR   PSA   HgB A1c     Encounter for immunization       Relevant Orders   Flu vaccine HIGH DOSE PF(Fluzone Trivalent) (Completed)     Wellness examination         Former smoker       Relevant Orders   Ambulatory Referral Lung Cancer Screening [facility]     Screen for colon cancer       Relevant Orders   Ambulatory referral to Gastroenterology     Anemia, unspecified type       Relevant Medications   rosuvastatin  (CRESTOR ) 10 MG tablet      Return in about 6 months (around 09/12/2024) for Hypertension.     Dorothyann Byars, MD

## 2024-03-15 ENCOUNTER — Ambulatory Visit: Payer: Self-pay | Admitting: Family Medicine

## 2024-03-15 DIAGNOSIS — R972 Elevated prostate specific antigen [PSA]: Secondary | ICD-10-CM

## 2024-03-15 LAB — HEMOGLOBIN A1C
Est. average glucose Bld gHb Est-mCnc: 120 mg/dL
Hgb A1c MFr Bld: 5.8 % — ABNORMAL HIGH (ref 4.8–5.6)

## 2024-03-15 LAB — CMP14+EGFR
ALT: 28 IU/L (ref 0–44)
AST: 23 IU/L (ref 0–40)
Albumin: 5 g/dL — ABNORMAL HIGH (ref 3.8–4.8)
Alkaline Phosphatase: 158 IU/L — ABNORMAL HIGH (ref 47–123)
BUN/Creatinine Ratio: 13 (ref 10–24)
BUN: 18 mg/dL (ref 8–27)
Bilirubin Total: 0.7 mg/dL (ref 0.0–1.2)
CO2: 22 mmol/L (ref 20–29)
Calcium: 9.9 mg/dL (ref 8.6–10.2)
Chloride: 99 mmol/L (ref 96–106)
Creatinine, Ser: 1.35 mg/dL — ABNORMAL HIGH (ref 0.76–1.27)
Globulin, Total: 2.9 g/dL (ref 1.5–4.5)
Glucose: 108 mg/dL — ABNORMAL HIGH (ref 70–99)
Potassium: 4.8 mmol/L (ref 3.5–5.2)
Sodium: 139 mmol/L (ref 134–144)
Total Protein: 7.9 g/dL (ref 6.0–8.5)
eGFR: 55 mL/min/1.73 — ABNORMAL LOW (ref >59)

## 2024-03-15 LAB — PSA: Prostate Specific Ag, Serum: 3.7 ng/mL (ref 0.0–4.0)

## 2024-03-15 NOTE — Progress Notes (Signed)
 Hi Christopher [name], kidney function is around 1.3 you tend to bounce between 1.1 and 1.3 so I think this is pretty stable for you.  Your alkaline phosphatase is mildly elevated similar to what it is been over the last year so pretty stable.  Your A1c looks great at this time and had jumped up to 7 7 months ago and it is back down to 5.8 which is absolutely fantastic.  Prostate test is at 3.7, you are at 3.07 months ago so I would like to recheck it again in 6 months if it continues to trend upward then I would like to get you in with a urologist.  Recommend getting your colonoscopy updated as well.

## 2024-03-22 DIAGNOSIS — F3342 Major depressive disorder, recurrent, in full remission: Secondary | ICD-10-CM | POA: Diagnosis not present

## 2024-03-23 ENCOUNTER — Encounter: Payer: Self-pay | Admitting: Gastroenterology

## 2024-04-04 ENCOUNTER — Telehealth: Payer: Self-pay | Admitting: Family Medicine

## 2024-04-04 DIAGNOSIS — I7143 Infrarenal abdominal aortic aneurysm, without rupture: Secondary | ICD-10-CM

## 2024-04-04 DIAGNOSIS — J410 Simple chronic bronchitis: Secondary | ICD-10-CM

## 2024-04-04 DIAGNOSIS — R911 Solitary pulmonary nodule: Secondary | ICD-10-CM

## 2024-04-04 NOTE — Telephone Encounter (Signed)
 Spoke w/pt and he is ok with getting the regular CT done as long as his insurance will approve.

## 2024-04-04 NOTE — Telephone Encounter (Signed)
 Call pt: Karna let me now that you don't qualify for the Lung cancer screening Chest CT.  Does he want to see if we can get isurance to cover a regular CT of the chest?

## 2024-04-26 ENCOUNTER — Ambulatory Visit (AMBULATORY_SURGERY_CENTER)

## 2024-04-26 VITALS — Ht 74.0 in | Wt 220.8 lb

## 2024-04-26 DIAGNOSIS — Z800 Family history of malignant neoplasm of digestive organs: Secondary | ICD-10-CM

## 2024-04-26 DIAGNOSIS — Z860100 Personal history of colon polyps, unspecified: Secondary | ICD-10-CM

## 2024-04-26 NOTE — Progress Notes (Signed)

## 2024-05-03 ENCOUNTER — Encounter: Payer: Self-pay | Admitting: Gastroenterology

## 2024-05-10 ENCOUNTER — Ambulatory Visit: Admitting: Gastroenterology

## 2024-05-10 ENCOUNTER — Encounter: Payer: Self-pay | Admitting: Gastroenterology

## 2024-05-10 VITALS — BP 120/76 | HR 68 | Temp 97.2°F | Resp 11 | Ht 74.0 in | Wt 220.0 lb

## 2024-05-10 DIAGNOSIS — Z860101 Personal history of adenomatous and serrated colon polyps: Secondary | ICD-10-CM | POA: Diagnosis not present

## 2024-05-10 DIAGNOSIS — D123 Benign neoplasm of transverse colon: Secondary | ICD-10-CM | POA: Diagnosis not present

## 2024-05-10 DIAGNOSIS — Z800 Family history of malignant neoplasm of digestive organs: Secondary | ICD-10-CM

## 2024-05-10 DIAGNOSIS — Z1211 Encounter for screening for malignant neoplasm of colon: Secondary | ICD-10-CM

## 2024-05-10 DIAGNOSIS — Q438 Other specified congenital malformations of intestine: Secondary | ICD-10-CM | POA: Diagnosis not present

## 2024-05-10 DIAGNOSIS — K635 Polyp of colon: Secondary | ICD-10-CM | POA: Diagnosis not present

## 2024-05-10 DIAGNOSIS — K641 Second degree hemorrhoids: Secondary | ICD-10-CM | POA: Diagnosis not present

## 2024-05-10 DIAGNOSIS — Z860100 Personal history of colon polyps, unspecified: Secondary | ICD-10-CM

## 2024-05-10 MED ORDER — SODIUM CHLORIDE 0.9 % IV SOLN: 500.0000 mL | INTRAVENOUS | Status: AC

## 2024-05-10 NOTE — Progress Notes (Signed)
 Pt states no changes to health hx or medications since previsit.

## 2024-05-10 NOTE — Progress Notes (Signed)
 Report given to PACU, vss

## 2024-05-10 NOTE — Patient Instructions (Addendum)
 HAPPY BIRTHDAY!!! High fiber diet. Use FiberCon 1-2 tablets by mouth daily. Continue present medications. Awaiting pathology results.  Repeat colonoscopy in 3-5 years for surveillance based on pathology results.  Handouts provided on polyps and hemorrhoids.   YOU HAD AN ENDOSCOPIC PROCEDURE TODAY AT THE [facility]:   Refer to the procedure report that was given to you for any specific questions about what was found during the examination.  If the procedure report does not answer your questions, please call your gastroenterologist to clarify.  If you requested that your care partner not be given the details of your procedure findings, then the procedure report has been included in a sealed envelope for you to review at your convenience later.  YOU SHOULD EXPECT: Some feelings of bloating in the abdomen. Passage of more gas than usual.  Walking can help get rid of the air that was put into your GI tract during the procedure and reduce the bloating. If you had a lower endoscopy (such as a colonoscopy or flexible sigmoidoscopy) you may notice spotting of blood in your stool or on the toilet paper. If you underwent a bowel prep for your procedure, you may not have a normal bowel movement for a few days.  Please Note:  You might notice some irritation and congestion in your nose or some drainage.  This is from the oxygen used during your procedure.  There is no need for concern and it should clear up in a day or so.  SYMPTOMS TO REPORT IMMEDIATELY:  Following lower endoscopy (colonoscopy or flexible sigmoidoscopy):  Excessive amounts of blood in the stool  Significant tenderness or worsening of abdominal pains  Swelling of the abdomen that is new, acute  Fever of 100F or higher  For urgent or emergent issues, a gastroenterologist can be reached at any hour by calling [phone]. Do not use MyChart messaging for urgent concerns.    DIET:  We do recommend a small meal at first,  but then you may proceed to your regular diet.  Drink plenty of fluids but you should avoid alcoholic beverages for 24 hours.  ACTIVITY:  You should plan to take it easy for the rest of today and you should NOT DRIVE or use heavy machinery until tomorrow (because of the sedation medicines used during the test).    FOLLOW UP: Our staff will call the number listed on your records the next business day following your procedure.  We will call around 7:15- 8:00 am to check on you and address any questions or concerns that you may have regarding the information given to you following your procedure. If we do not reach you, we will leave a message.     If any biopsies were taken you will be contacted by phone or by letter within the next 1-3 weeks.  Please call us  at [phone] if you have not heard about the biopsies in 3 weeks.    SIGNATURES/CONFIDENTIALITY: You and/or your care partner have signed paperwork which will be entered into your electronic medical record.  These signatures attest to the fact that that the information above on your After Visit Summary has been reviewed and is understood.  Full responsibility of the confidentiality of this discharge information lies with you and/or your care-partner.

## 2024-05-10 NOTE — Op Note (Signed)
 [facility] Patient Name: Christopher [name] Procedure Date: 05/10/2024 10:01 AM MRN: [id number] Endoscopist: Aloha Finner , MD, [id number] Age: 73 Referring MD:  Date of Birth: 27-Jan-1951 Gender: Male Account #: [id number] Procedure:                Colonoscopy Indications:              Screening in patient at increased risk: Colorectal                            cancer in sister before age 39, Surveillance:                            Personal history of adenomatous polyps on last                            colonoscopy 5 years ago, High risk colon cancer                            surveillance: Personal history of adenoma (10 mm or                            greater in size) Medicines:                Monitored Anesthesia Care Procedure:                Pre-Anesthesia Assessment:                           - Prior to the procedure, a History and Physical                            was performed, and patient medications and                            allergies were reviewed. The patient's tolerance of                            previous anesthesia was also reviewed. The risks                            and benefits of the procedure and the sedation                            options and risks were discussed with the patient.                            All questions were answered, and informed consent                            was obtained. Prior Anticoagulants: The patient has                            taken no anticoagulant or antiplatelet agents. ASA  Grade Assessment: III - A patient with severe                            systemic disease. After reviewing the risks and                            benefits, the patient was deemed in satisfactory                            condition to undergo the procedure.                           After obtaining informed consent, the colonoscope                            was passed under direct vision.  Throughout the                            procedure, the patient's blood pressure, pulse, and                            oxygen saturations were monitored continuously. The                            Olympus CF-HQ190L ([id number]) Colonoscope was                            introduced through the anus and advanced to the the                            cecum, identified by appendiceal orifice and                            ileocecal valve. The colonoscopy was performed                            without difficulty. The patient tolerated the                            procedure. The quality of the bowel preparation was                            adequate. The terminal ileum, ileocecal valve,                            appendiceal orifice, and rectum were photographed. Scope In: 10:16:05 AM Scope Out: 10:36:42 AM Scope Withdrawal Time: 0 hours 17 minutes 17 seconds  Total Procedure Duration: 0 hours 20 minutes 37 seconds  Findings:                 The digital rectal exam findings include                            hemorrhoids. Pertinent negatives include no  palpable rectal lesions.                           A large amount of semi-liquid stool was found in                            the entire colon, interfering with visualization.                            Lavage of the area was performed using copious                            amounts, resulting in clearance with adequate                            visualization.                           The colon (entire examined portion) was moderately                            redundant.                           The terminal ileum and ileocecal valve appeared                            normal.                           Three sessile polyps were found in the transverse                            colon (2) and hepatic flexure (1). The polyps were                            3 to 6 mm in size. These polyps were removed with a                             cold snare. Resection and retrieval were complete.                           Normal mucosa was found in the entire colon                            otherwise.                           Non-bleeding non-thrombosed internal hemorrhoids                            were found during retroflexion, during perianal                            exam and during digital exam. The hemorrhoids were  Grade II (internal hemorrhoids that prolapse but                            reduce spontaneously). Complications:            No immediate complications. Estimated Blood Loss:     Estimated blood loss was minimal. Impression:               - Hemorrhoids found on digital rectal exam.                           - Stool in the entire examined colon.                           - Redundant colon.                           - The examined portion of the ileum was normal.                           - Three 3 to 6 mm polyps in the transverse colon                            and at the hepatic flexure, removed with a cold                            snare. Resected and retrieved.                           - Normal mucosa in the entire examined colon                            otherwise.                           - Non-bleeding non-thrombosed internal hemorrhoids. Recommendation:           - The patient will be observed post-procedure,                            until all discharge criteria are met.                           - Discharge patient to home.                           - Patient has a contact number available for                            emergencies. The signs and symptoms of potential                            delayed complications were discussed with the                            patient. Return to normal activities tomorrow.  Written discharge instructions were provided to the                            patient.                            - HAPPY BIRTHDAY!!!                           - High fiber diet.                           - Use FiberCon 1-2 tablets PO daily.                           - Continue present medications.                           - Await pathology results.                           - Repeat colonoscopy in 3 - 5 years for                            surveillance based on pathology results (and                            patients health at the time).                           - The findings and recommendations were discussed                            with the patient.                           - The findings and recommendations were discussed                            with the patient's family. Aloha Finner, MD 05/10/2024 10:43:46 AM

## 2024-05-10 NOTE — Progress Notes (Signed)
 Called to room to assist during endoscopic procedure.  Patient ID and intended procedure confirmed with present staff. Received instructions for my participation in the procedure from the performing physician.

## 2024-05-10 NOTE — Progress Notes (Signed)
 GASTROENTEROLOGY PROCEDURE H&P NOTE   Primary Care Physician: Alvan Dorothyann BIRCH, MD  HPI: Christopher [name] is a 73 y.o. male who presents for Colonoscopy for surveillance of previous adenomas and advanced adenomas & FHx Colon Cancer.  Past Medical History:  Diagnosis Date   Anxiety    Blood transfusion without reported diagnosis    Clotting disorder 2011   DVT s/p surgery   COPD (chronic obstructive pulmonary disease) (HCC)    Depression    Gastric ulcer    GERD (gastroesophageal reflux disease)    Hyperlipidemia    Hypertension    Pancreatitis    11/2010. 2011   Stroke [facility])    Past Surgical History:  Procedure Laterality Date   abdominal reconstructin     X 3   BILE DUCT EXPLORATION     CHOLECYSTECTOMY     Duodenum Stricture     Explorratory Laparotomy     GASTRECTOMY     GASTROJEJUNOSTOMY     LAPAROSCOPIC LYSIS OF ADHESIONS     Pancreatic leak repair     Resection of distal stomach and proximal Roux limb     ROUX-EN-Y GASTRIC BYPASS     Current Outpatient Medications  Medication Sig Dispense Refill   albuterol  (PROAIR  HFA) 108 (90 Base) MCG/ACT inhaler Inhale 2 puffs into the lungs every 6 (six) hours as needed for wheezing. (Patient not taking: Reported on 04/26/2024) 6.7 g 1   citalopram  (CELEXA ) 20 MG tablet Take 1 tablet (20 mg total) by mouth daily. 90 tablet 0   lisinopril  (ZESTRIL ) 10 MG tablet Take 1 tablet (10 mg total) by mouth daily. 90 tablet 0   Multiple Vitamin (MULTIVITAMIN) tablet Take 1 tablet by mouth daily.     omeprazole  (PRILOSEC) 20 MG capsule Take 1 capsule (20 mg total) by mouth daily. 90 capsule 3   rosuvastatin  (CRESTOR ) 10 MG tablet Take 1 tablet (10 mg total) by mouth daily. 90 tablet 3   umeclidinium-vilanterol (ANORO ELLIPTA ) 62.5-25 MCG/ACT AEPB Inhale 1 puff into the lungs daily. 180 each 3   No current facility-administered medications for this visit.   Current Medications[1] Allergies[2] Family History  Problem Relation Age  of Onset   Hypertension Mother    Heart attack Father    Prostate cancer Father        prostate, lip cancer   Uterine cancer Sister    Colon cancer Sister 42   Colon cancer Sister 35   Ovarian cancer Sister    Lung cancer Brother    Heart attack Daughter    Esophageal cancer Neg Hx    Stomach cancer Neg Hx    Rectal cancer Neg Hx    Inflammatory bowel disease Neg Hx    Liver disease Neg Hx    Pancreatic cancer Neg Hx    Social History   Socioeconomic History   Marital status: Divorced    Spouse name: Not on file   Number of children: 4   Years of education: 12   Highest education level: 12th grade  Occupational History   Occupation: music therapist work    Comment: retired  Tobacco Use   Smoking status: Former    Current packs/day: 0.00    Average packs/day: 1 pack/day for 32.2 years (32.2 ttl pk-yrs)    Types: Cigarettes    Start date: 09/23/1966    Quit date: 12/05/1998    Years since quitting: 25.4   Smokeless tobacco: Former    Types: Chew   Tobacco comments:  Quit 15 years ago  Vaping Use   Vaping status: Never Used  Substance and Sexual Activity   Alcohol use: Not Currently   Drug use: No   Sexual activity: Not Currently  Other Topics Concern   Not on file  Social History Narrative   Lives with daughter and son in law. 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 Visits [place]  for 5-6 months a year.    Social Drivers of Health   Tobacco Use: Medium Risk (04/26/2024)   Patient History    Smoking Tobacco Use: Former    Smokeless Tobacco Use: Former    Passive Exposure: Not on Actuary Strain: Low Risk (03/10/2024)   Overall Financial Resource Strain (CARDIA)    Difficulty of Paying Living Expenses: Not very hard  Food Insecurity: No Food Insecurity (03/10/2024)   Epic    Worried About Programme Researcher, Broadcasting/film/video in the Last Year: Never true    Ran Out of Food in the Last Year: Never true  Transportation Needs: No Transportation Needs (03/10/2024)    Epic    Lack of Transportation (Medical): No    Lack of Transportation (Non-Medical): No  Physical Activity: Insufficiently Active (03/10/2024)   Exercise Vital Sign    Days of Exercise per Week: 5 days    Minutes of Exercise per Session: 20 min  Stress: No Stress Concern Present (03/10/2024)   [employer] of Occupational Health - Occupational Stress Questionnaire    Feeling of Stress: Only a little  Social Connections: Socially Isolated (03/10/2024)   Social Connection and Isolation Panel    Frequency of Communication with Friends and Family: More than three times a week    Frequency of Social Gatherings with Friends and Family: Twice a week    Attends Religious Services: Never    Database Administrator or Organizations: No    Attends Engineer, Structural: Not on file    Marital Status: Divorced  Intimate Partner Violence: Not At Risk (10/07/2023)   Humiliation, Afraid, Rape, and Kick questionnaire    Fear of Current or Ex-Partner: No    Emotionally Abused: No    Physically Abused: No    Sexually Abused: No  Depression (PHQ2-9): Low Risk (03/14/2024)   Depression (PHQ2-9)    PHQ-2 Score: 1  Alcohol Screen: Low Risk (10/07/2023)   Alcohol Screen    Last Alcohol Screening Score (AUDIT): 0  Housing: Low Risk (03/10/2024)   Epic    Unable to Pay for Housing in the Last Year: No    Number of Times Moved in the Last Year: 0    Homeless in the Last Year: No  Utilities: Not At Risk (10/07/2023)   [facility] Utilities    Threatened with loss of utilities: No  Health Literacy: Adequate Health Literacy (10/07/2023)   B1300 Health Literacy    Frequency of need for help with medical instructions: Never    Physical Exam: There were no vitals filed for this visit. There is no height or weight on file to calculate BMI. GEN: NAD EYE: Sclerae anicteric ENT: MMM CV: Non-tachycardic GI: Soft, NT/ND NEURO:  Alert & Oriented x 3  Lab Results: No results for input(s): WBC,  HGB, HCT, PLT in the last 72 hours. BMET No results for input(s): NA, K, CL, CO2, GLUCOSE, BUN, CREATININE, CALCIUM  in the last 72 hours. LFT No results for input(s): PROT, ALBUMIN, AST, ALT, ALKPHOS, BILITOT, BILIDIR, IBILI in the last 72 hours. PT/INR No results for input(s):  LABPROT, INR in the last 72 hours.   Impression / Plan: This is a 73 y.o.male who presents for Colonoscopy for surveillance of previous adenomas and advanced adenomas & FHx Colon Cancer.  The risks and benefits of endoscopic evaluation/treatment were discussed with the patient and/or family; these include but are not limited to the risk of perforation, infection, bleeding, missed lesions, lack of diagnosis, severe illness requiring hospitalization, as well as anesthesia and sedation related illnesses.  The patient's history has been reviewed, patient examined, no change in status, and deemed stable for procedure.  The patient and/or family was provided an opportunity to ask questions and all were answered.  The patient and/or family is agreeable to proceed.    Aloha Finner, MD [facility] Office # [phone]     [1]  Current Outpatient Medications:    albuterol  (PROAIR  HFA) 108 (90 Base) MCG/ACT inhaler, Inhale 2 puffs into the lungs every 6 (six) hours as needed for wheezing. (Patient not taking: Reported on 04/26/2024), Disp: 6.7 g, Rfl: 1   citalopram  (CELEXA ) 20 MG tablet, Take 1 tablet (20 mg total) by mouth daily., Disp: 90 tablet, Rfl: 0   lisinopril  (ZESTRIL ) 10 MG tablet, Take 1 tablet (10 mg total) by mouth daily., Disp: 90 tablet, Rfl: 0   Multiple Vitamin (MULTIVITAMIN) tablet, Take 1 tablet by mouth daily., Disp: , Rfl:    omeprazole  (PRILOSEC) 20 MG capsule, Take 1 capsule (20 mg total) by mouth daily., Disp: 90 capsule, Rfl: 3   rosuvastatin  (CRESTOR ) 10 MG tablet, Take 1 tablet (10 mg total) by mouth daily., Disp: 90  tablet, Rfl: 3   umeclidinium-vilanterol (ANORO ELLIPTA ) 62.5-25 MCG/ACT AEPB, Inhale 1 puff into the lungs daily., Disp: 180 each, Rfl: 3 [2]  Allergies Allergen Reactions   Lorazepam Other (See Comments)    hallucinations   Onion Nausea And Vomiting   Nsaids Other (See Comments)    Hx of chronic recurrent GI ulcer.    Pravastatin  Other (See Comments)    myalgias   Simvastatin  Other (See Comments)    myalgias

## 2024-05-11 ENCOUNTER — Telehealth: Payer: Self-pay | Admitting: *Deleted

## 2024-05-11 NOTE — Telephone Encounter (Signed)
°  Follow up Call-     05/10/2024    9:41 AM  Call back number  Post procedure Call Back phone  # [phone]  Permission to leave phone message Yes     Patient questions:  Do you have a fever, pain , or abdominal swelling? No. Pain Score  0 *  Have you tolerated food without any problems? Yes.    Have you been able to return to your normal activities? Yes.    Do you have any questions about your discharge instructions: Diet   No. Medications  No. Follow up visit  No.  Do you have questions or concerns about your Care? No.  Actions: * If pain score is 4 or above: No action needed, pain <4.

## 2024-05-12 LAB — SURGICAL PATHOLOGY

## 2024-05-13 ENCOUNTER — Ambulatory Visit: Payer: Self-pay | Admitting: Gastroenterology

## 2024-06-13 ENCOUNTER — Other Ambulatory Visit: Payer: Self-pay | Admitting: Family Medicine

## 2024-06-13 ENCOUNTER — Encounter: Payer: Self-pay | Admitting: Family Medicine

## 2024-06-13 DIAGNOSIS — F325 Major depressive disorder, single episode, in full remission: Secondary | ICD-10-CM

## 2024-06-14 NOTE — Telephone Encounter (Signed)
 Please have patient contact his insurance company to see what is comparable and what is covered under his plan there are about 6 different inhalers that fall into this class and really there is not a great way for us  to know which 1 is covered.  And I want a make this a seamless for him and as affordable as possible

## 2024-06-15 NOTE — Telephone Encounter (Signed)
 Last read by Wolm Ash at 5:28PM on 06/14/2024.

## 2024-06-29 NOTE — Telephone Encounter (Signed)
 See if they will cover Stiolto or Bevespi 

## 2024-06-30 MED ORDER — BEVESPI AEROSPHERE 9-4.8 MCG/ACT IN AERO: 2.0000 | INHALATION_SPRAY | Freq: Two times a day (BID) | RESPIRATORY_TRACT | 3 refills | Status: AC

## 2024-06-30 NOTE — Telephone Encounter (Signed)
 Rx printed, ok to fax.

## 2024-06-30 NOTE — Telephone Encounter (Signed)
 Spoke with [employer] and was told that in order to participate in their direct rx /trump rx program  they would need the Bevespi  prescription sent to them to see if would be affordable for the patient and then they if it is they will fillthe medication at their pharmacy - she states this is not patient assistance.

## 2024-08-23 ENCOUNTER — Ambulatory Visit: Admitting: Family Medicine

## 2024-10-12 ENCOUNTER — Ambulatory Visit
